# Patient Record
Sex: Male | Born: 1955 | Race: White | Hispanic: No | Marital: Married | Smoking: Never smoker
Health system: Southern US, Community
[De-identification: ages and names within clinical notes are randomized; demographics above are authoritative.]

## PROBLEM LIST (undated history)

## (undated) DIAGNOSIS — K4090 Unilateral inguinal hernia, without obstruction or gangrene, not specified as recurrent: Secondary | ICD-10-CM

## (undated) DIAGNOSIS — R059 Cough, unspecified: Secondary | ICD-10-CM

## (undated) DIAGNOSIS — B9789 Other viral agents as the cause of diseases classified elsewhere: Secondary | ICD-10-CM

## (undated) DIAGNOSIS — I499 Cardiac arrhythmia, unspecified: Secondary | ICD-10-CM

## (undated) DIAGNOSIS — R140 Abdominal distension (gaseous): Secondary | ICD-10-CM

## (undated) DIAGNOSIS — R05 Cough: Secondary | ICD-10-CM

## (undated) DIAGNOSIS — Z833 Family history of diabetes mellitus: Secondary | ICD-10-CM

## (undated) DIAGNOSIS — L299 Pruritus, unspecified: Secondary | ICD-10-CM

## (undated) HISTORY — PX: WISDOM TOOTH EXTRACTION: SHX21

## (undated) HISTORY — DX: Pruritus, unspecified: L29.9

## (undated) HISTORY — PX: CARDIAC CATHETERIZATION: SHX172

## (undated) HISTORY — DX: Other viral agents as the cause of diseases classified elsewhere: B97.89

## (undated) HISTORY — DX: Cough, unspecified: R05.9

## (undated) HISTORY — DX: Unilateral inguinal hernia, without obstruction or gangrene, not specified as recurrent: K40.90

## (undated) HISTORY — DX: Cough: R05

## (undated) HISTORY — DX: Family history of diabetes mellitus: Z83.3

## (undated) HISTORY — DX: Abdominal distension (gaseous): R14.0

---

## 2001-07-08 ENCOUNTER — Ambulatory Visit (HOSPITAL_COMMUNITY): Admission: RE | Admit: 2001-07-08 | Discharge: 2001-07-08 | Payer: Self-pay | Admitting: Gastroenterology

## 2011-01-23 ENCOUNTER — Encounter (INDEPENDENT_AMBULATORY_CARE_PROVIDER_SITE_OTHER): Payer: Self-pay | Admitting: General Surgery

## 2011-01-23 ENCOUNTER — Encounter (HOSPITAL_COMMUNITY): Payer: Self-pay | Admitting: Pharmacy Technician

## 2011-01-23 ENCOUNTER — Ambulatory Visit (INDEPENDENT_AMBULATORY_CARE_PROVIDER_SITE_OTHER): Payer: BC Managed Care – PPO | Admitting: General Surgery

## 2011-01-23 VITALS — BP 132/86 | HR 68 | Temp 97.8°F | Resp 16 | Ht 77.0 in | Wt 210.4 lb

## 2011-01-23 DIAGNOSIS — K4020 Bilateral inguinal hernia, without obstruction or gangrene, not specified as recurrent: Secondary | ICD-10-CM

## 2011-01-23 NOTE — Progress Notes (Signed)
Chief Complaint  Patient presents with  . Other    Eval right inguinal henia    HISTORY: Pt is health 56 year old male who presents with several weeks of progressively worsening R groin pain.  He does not recall any significant event where he was doing heavy lifting.  He does not recall any trauma.  He has noted a bulge.  He denies nausea/vomiting.  He denies urinary retention or cough.  He has never had hernias before, but his father had bilateral inguinal hernias.  He has been taking over the counter analgesics.    Past Medical History  Diagnosis Date  . Right inguinal hernia   . Abdominal pain   . Abdominal distension   . Cough   . Itching   . Pleurisy   . Unspecified viral infection, in conditions classified elsewhere and of unspecified site     viral syndrome - per notes from [facility] dated 01/03/11  . Family history of diabetes mellitus    Wisdom teeth extraction  History reviewed. No pertinent past surgical history.  Current Outpatient Prescriptions  Medication Sig Dispense Refill  . traMADol (ULTRAM) 50 MG tablet as needed.         No Known Allergies   History reviewed. No pertinent family history.   History   Social History  . Marital Status: Married    Spouse Name: N/A    Number of Children: N/A  . Years of Education: N/A   Social History Main Topics  . Smoking status: Never Smoker   . Smokeless tobacco: Never Used  . Alcohol Use: Yes     1 - 3 wine or beer weekly  . Drug Use: No  . Sexually Active: None   Other Topics Concern  . None   Social History Narrative  . None     REVIEW OF SYSTEMS - PERTINENT POSITIVES ONLY: 12 point review of systems negative other than HPI and PMH except for abdominal distention.  EXAM: Filed Vitals:   01/23/11 1339  BP: 132/86  Pulse: 68  Temp: 97.8 F (36.6 C)  Resp: 16    Gen:  No acute distress.  Well nourished and well groomed.   Neurological: Alert and oriented to person, place, and time.  Coordination normal.  Head: Normocephalic and atraumatic.  Eyes: Conjunctivae are normal. Pupils are equal, round, and reactive to light. No scleral icterus.  Neck: Normal range of motion. Neck supple. No tracheal deviation or thyromegaly present.  Cardiovascular: Normal rate, regular rhythm, normal heart sounds and intact distal pulses.  Exam reveals no gallop and no friction rub.  No murmur heard. Respiratory: Effort normal.  No respiratory distress. No chest wall tenderness. Breath sounds normal.  No wheezes, rales or rhonchi.  GI: Soft. Bowel sounds are normal. The abdomen is soft and nontender.  There is no rebound and no guarding.  Groin:  Bilateral hernias present.  Small left, moderate right.  Right side is tender.  Normal external male genitalia.  Musculoskeletal: Normal range of motion. Extremities are nontender.  Lymphadenopathy: No cervical, preauricular, postauricular or axillary adenopathy is present Skin: Skin is warm and dry. No rash noted. No diaphoresis. No erythema. No pallor. No clubbing, cyanosis, or edema.   Psychiatric: Normal mood and affect. Behavior is normal. Judgment and thought content normal.     ASSESSMENT AND PLAN: Bilateral inguinal hernia Pt with symptomatic Right inguinal hernia and left inguinal hernia Plan laparoscopic repair of both.   I described the procedure in detail.  The patient was given educational material. We discussed the risks and benefits including but not limited to bleeding, infection, chronic pain, nerve entrapment, hernia recurrence, mesh complications, hematoma formation, urinary retention, injury to the testicles or the ovaries, numbness in the groin, blood clots, injury to the surrounding structures, and anesthesia risk. We also discussed the typical post operative recovery course, including no heavy lifting for 6 weeks.     Maudry Diego MD Surgical Oncology, General and Endocrine Surgery [facility]   Visit  Diagnoses: 1. Bilateral inguinal hernia     Primary Care Physician: Delorse Lek, MD, MD

## 2011-01-23 NOTE — Assessment & Plan Note (Signed)
Pt with symptomatic Right inguinal hernia and left inguinal hernia Plan laparoscopic repair of both.   I described the procedure in detail.  The patient was given educational material. We discussed the risks and benefits including but not limited to bleeding, infection, chronic pain, nerve entrapment, hernia recurrence, mesh complications, hematoma formation, urinary retention, injury to the testicles or the ovaries, numbness in the groin, blood clots, injury to the surrounding structures, and anesthesia risk. We also discussed the typical post operative recovery course, including no heavy lifting for 6 weeks.

## 2011-01-23 NOTE — Patient Instructions (Signed)
PATIENT INSTRUCTIONS  HERNIA  FOLLOW-UP:  Please make an appointment with your physician in 3 week(s).  Call your physician immediately if you have any fevers greater than 102.5, drainage from you wound that is not clear or looks infected, persistent bleeding, increasing abdominal pain, problems urinating, or persistent nausea/vomiting.    WOUND CARE INSTRUCTIONS:   Your wound will be covered with Dermabond surgical glue or Steristrips, gauze and tape.  If there are outer dressings in place such as gauze and tape, you should remove the outer dressing in 48 hours.  If clothing rubs against the wound or causes irritation and the wound is not draining you may cover it with a dry dressing during the daytime.  Try to keep the wound dry and avoid ointments on the wound unless directed to do so.  If the wound becomes bright red and painful or starts to drain infected material that is not clear, please contact your physician immediately.  If the wound is mildly pink and has a thick firm ridge underneath it, this is normal, and is referred to as a healing ridge.  This will resolve over the next 4-6 weeks. You may use ice packs or a heating pad on your incision, but place a shirt or towel between the ice pack/heating pad and your skin.  You may do this for 10-20 minutes every 4 hours.  You can expect to see bruising and swelling in the scrotum or labia if you have had an inguinal hernia repair.  If you have had a ventral/incisional/umbilical hernia repair, you may see some swelling where the hernia was.    DIET:  You may eat any foods that you can tolerate.  It is a good idea to eat a high fiber diet and take in plenty of fluids to prevent constipation.  If you do become constipated you may want to take a mild laxative or take Ducolax tablets on a daily basis until your bowel habits are regular.  Constipation can be very uncomfortable, along with straining, after recent abdominal surgery.  ACTIVITY:  You are  encouraged to cough and deep breath or use your incentive spirometer if you were given one, every 15-30 minutes when awake.  This will help prevent respiratory complications and low grade fevers post-operatively.  You may want to hug a pillow when coughing and sneezing to add additional support to the surgical area which will decrease pain during these times.  You are encouraged to walk and engage in light activity for the next two weeks.  You should not lift more than 20 pounds for 6 weeks as it could put you at increased risk for a hernia recurrence.  Twenty pounds is roughly equivalent to a plastic bag of groceries.    MEDICATIONS:  Try to take narcotic medications and anti-inflammatory medications, such as ibuprofen, naprosyn, etc., with food.  This will minimize stomach upset from the medication.  You may take Tylenol (acetominophen) if you are not taking your prescription medicine.  The prescription medicine has tylenol in it, and if you take both, you may overdose on tylenol which can cause liver failure.   Should you develop nausea and vomiting from the pain medication, or develop a rash, please discontinue the medication and contact your physician.  You should not drive, make important decisions, or operate machinery when taking narcotic pain medication.  QUESTIONS:  Please feel free to call our office [phone] if you have any questions, and we will be glad to assist you.    Please give all insurance/disability forms to the front desk at our office.     

## 2011-01-25 ENCOUNTER — Ambulatory Visit (INDEPENDENT_AMBULATORY_CARE_PROVIDER_SITE_OTHER): Payer: BC Managed Care – PPO | Admitting: General Surgery

## 2011-01-27 ENCOUNTER — Encounter (HOSPITAL_COMMUNITY)
Admission: RE | Admit: 2011-01-27 | Discharge: 2011-01-27 | Disposition: A | Discharge status: Home / Self Care | Payer: BC Managed Care – PPO | Source: Ambulatory Visit | Attending: General Surgery | Admitting: General Surgery

## 2011-01-27 ENCOUNTER — Encounter (HOSPITAL_COMMUNITY): Payer: Self-pay

## 2011-01-27 LAB — CBC
HCT: 44.5 % (ref 39.0–52.0)
Hemoglobin: 16.2 g/dL (ref 13.0–17.0)
RBC: 5.19 MIL/uL (ref 4.22–5.81)
RDW: 13 % (ref 11.5–15.5)
WBC: 5 10*3/uL (ref 4.0–10.5)

## 2011-01-27 LAB — SURGICAL PCR SCREEN: Staphylococcus aureus: POSITIVE — AB

## 2011-01-27 NOTE — Pre-Procedure Instructions (Signed)
Alex Arias  01/27/2011   Your procedure is scheduled on:  02/01/11  Report to [facility] at 6:30 AM.  Call this number if you have problems the morning of surgery: [phone]   Remember: Discontinue Aspirin, Coumadin, Plavix, Effient and herbal medications.   Do not eat food:After Midnight.  May have clear liquids: up to 4 Hours before arrival.  Clear liquids include soda, tea, black coffee, apple or grape juice, broth.  Take these medicines the morning of surgery with A SIP OF WATER: Tramadol   Do not wear jewelry, make-up or nail polish.  Do not wear lotions, powders, or perfumes. You may wear deodorant.  Do not shave 48 hours prior to surgery.  Do not bring valuables to the hospital.  Contacts, dentures or bridgework may not be worn into surgery.  Leave suitcase in the car. After surgery it may be brought to your room.  For patients admitted to the hospital, checkout time is 11:00 AM the day of discharge.   Patients discharged the day of surgery will not be allowed to drive home.  Name and phone number of your driver: spouse Alex Arias  Special Instructions: CHG Shower Use Special Wash: 1/2 bottle night before surgery and 1/2 bottle morning of surgery.   Please read over the following fact sheets that you were given: Pain Booklet, Coughing and Deep Breathing, MRSA Information and Surgical Site Infection Prevention

## 2011-01-31 MED ORDER — CEFAZOLIN SODIUM-DEXTROSE 2-3 GM-% IV SOLR
2.0000 g | INTRAVENOUS | Status: AC
  Administered 2011-02-01: 2 g via INTRAVENOUS
  Filled 2011-01-31: qty 50

## 2011-02-01 ENCOUNTER — Ambulatory Visit (HOSPITAL_COMMUNITY): Payer: BC Managed Care – PPO | Admitting: Anesthesiology

## 2011-02-01 ENCOUNTER — Encounter (HOSPITAL_COMMUNITY): Payer: Self-pay | Admitting: Anesthesiology

## 2011-02-01 ENCOUNTER — Encounter (HOSPITAL_COMMUNITY)
Admission: RE | Disposition: A | Discharge status: Home / Self Care | Payer: Self-pay | Source: Ambulatory Visit | Attending: General Surgery

## 2011-02-01 ENCOUNTER — Other Ambulatory Visit (INDEPENDENT_AMBULATORY_CARE_PROVIDER_SITE_OTHER): Payer: Self-pay | Admitting: General Surgery

## 2011-02-01 ENCOUNTER — Encounter (HOSPITAL_COMMUNITY): Payer: Self-pay | Admitting: *Deleted

## 2011-02-01 ENCOUNTER — Ambulatory Visit (HOSPITAL_COMMUNITY)
Admission: RE | Admit: 2011-02-01 | Discharge: 2011-02-01 | Disposition: A | Discharge status: Home / Self Care | Payer: BC Managed Care – PPO | Source: Ambulatory Visit | Attending: General Surgery | Admitting: General Surgery

## 2011-02-01 ENCOUNTER — Encounter (HOSPITAL_COMMUNITY): Payer: Self-pay | Admitting: General Surgery

## 2011-02-01 DIAGNOSIS — K4020 Bilateral inguinal hernia, without obstruction or gangrene, not specified as recurrent: Secondary | ICD-10-CM | POA: Insufficient documentation

## 2011-02-01 DIAGNOSIS — Z01812 Encounter for preprocedural laboratory examination: Secondary | ICD-10-CM | POA: Insufficient documentation

## 2011-02-01 HISTORY — PX: INGUINAL HERNIA REPAIR: SHX194

## 2011-02-01 SURGERY — REPAIR, HERNIA, INGUINAL, LAPAROSCOPIC
Anesthesia: General | Site: Groin | Laterality: Bilateral | Wound class: Clean

## 2011-02-01 MED ORDER — PROPOFOL 10 MG/ML IV EMUL
INTRAVENOUS | Status: DC | PRN
  Administered 2011-02-01: 120 mg via INTRAVENOUS

## 2011-02-01 MED ORDER — NEOSTIGMINE METHYLSULFATE 1 MG/ML IJ SOLN
INTRAMUSCULAR | Status: DC | PRN
  Administered 2011-02-01: 3 mg via INTRAVENOUS

## 2011-02-01 MED ORDER — ROCURONIUM BROMIDE 100 MG/10ML IV SOLN
INTRAVENOUS | Status: DC | PRN
  Administered 2011-02-01: 50 mg via INTRAVENOUS

## 2011-02-01 MED ORDER — OXYCODONE-ACETAMINOPHEN 5-325 MG PO TABS: 1.0000 | ORAL_TABLET | ORAL | Status: AC | PRN

## 2011-02-01 MED ORDER — LIDOCAINE HCL 1 % IJ SOLN
INTRAMUSCULAR | Status: DC | PRN
  Administered 2011-02-01: 11:00:00

## 2011-02-01 MED ORDER — MIDAZOLAM HCL 5 MG/5ML IJ SOLN
INTRAMUSCULAR | Status: DC | PRN
  Administered 2011-02-01: 2 mg via INTRAVENOUS

## 2011-02-01 MED ORDER — ONDANSETRON HCL 4 MG/2ML IJ SOLN: 4.0000 mg | Freq: Once | INTRAMUSCULAR | Status: DC | PRN

## 2011-02-01 MED ORDER — GLYCOPYRROLATE 0.2 MG/ML IJ SOLN
INTRAMUSCULAR | Status: DC | PRN
  Administered 2011-02-01: .5 mg via INTRAVENOUS

## 2011-02-01 MED ORDER — FENTANYL CITRATE 0.05 MG/ML IJ SOLN
INTRAMUSCULAR | Status: DC | PRN
  Administered 2011-02-01: 100 ug via INTRAVENOUS
  Administered 2011-02-01: 25 ug via INTRAVENOUS
  Administered 2011-02-01: 150 ug via INTRAVENOUS
  Administered 2011-02-01: 25 ug via INTRAVENOUS
  Administered 2011-02-01: 100 ug via INTRAVENOUS

## 2011-02-01 MED ORDER — ONDANSETRON HCL 4 MG/2ML IJ SOLN
INTRAMUSCULAR | Status: DC | PRN
  Administered 2011-02-01: 4 mg via INTRAVENOUS

## 2011-02-01 MED ORDER — LACTATED RINGERS IV SOLN
INTRAVENOUS | Status: DC | PRN
  Administered 2011-02-01 (×2): via INTRAVENOUS

## 2011-02-01 MED ORDER — MORPHINE SULFATE 2 MG/ML IJ SOLN: 0.0500 mg/kg | INTRAMUSCULAR | Status: DC | PRN

## 2011-02-01 MED ORDER — MEPERIDINE HCL 25 MG/ML IJ SOLN: 6.2500 mg | INTRAMUSCULAR | Status: DC | PRN

## 2011-02-01 MED ORDER — PROMETHAZINE HCL 12.5 MG PO TABS: 12.5000 mg | ORAL_TABLET | Freq: Four times a day (QID) | ORAL | Status: AC | PRN

## 2011-02-01 MED ORDER — HYDROMORPHONE HCL PF 1 MG/ML IJ SOLN
0.2500 mg | INTRAMUSCULAR | Status: DC | PRN
  Administered 2011-02-01 (×2): 0.5 mg via INTRAVENOUS

## 2011-02-01 MED ORDER — 0.9 % SODIUM CHLORIDE (POUR BTL) OPTIME
TOPICAL | Status: DC | PRN
  Administered 2011-02-01: 2000 mL

## 2011-02-01 SURGICAL SUPPLY — 53 items
ADH SKN CLS LQ APL DERMABOND (GAUZE/BANDAGES/DRESSINGS) ×1
APPLIER CLIP 5 13 M/L LIGAMAX5 (MISCELLANEOUS) ×4
APPLIER CLIP ROT 10 11.4 M/L (STAPLE)
APR CLP MED LRG 11.4X10 (STAPLE)
APR CLP MED LRG 5 ANG JAW (MISCELLANEOUS) ×2
BLADE SURG ROTATE 9660 (MISCELLANEOUS) IMPLANT
CANISTER SUCTION 2500CC (MISCELLANEOUS) IMPLANT
CHLORAPREP W/TINT 26ML (MISCELLANEOUS) ×2 IMPLANT
CLIP APPLIE 5 13 M/L LIGAMAX5 (MISCELLANEOUS) IMPLANT
CLIP APPLIE ROT 10 11.4 M/L (STAPLE) IMPLANT
CLOTH BEACON ORANGE TIMEOUT ST (SAFETY) ×2 IMPLANT
COVER SURGICAL LIGHT HANDLE (MISCELLANEOUS) ×2 IMPLANT
DECANTER SPIKE VIAL GLASS SM (MISCELLANEOUS) ×2 IMPLANT
DERMABOND ADHESIVE PROPEN (GAUZE/BANDAGES/DRESSINGS) ×1
DERMABOND ADVANCED .7 DNX6 (GAUZE/BANDAGES/DRESSINGS) IMPLANT
DEVICE SECURE STRAP 25 ABSORB (INSTRUMENTS) ×3 IMPLANT
DISSECT BALLN SPACEMKR + OVL (BALLOONS) ×2
DISSECTOR BALLN SPACEMKR + OVL (BALLOONS) ×1 IMPLANT
DRAPE UTILITY 15X26 W/TAPE STR (DRAPE) ×4 IMPLANT
DRAPE UTILITY XL STRL (DRAPES) ×4 IMPLANT
DRAPE WARM FLUID 44X44 (DRAPE) ×2 IMPLANT
ELECT REM PT RETURN 9FT ADLT (ELECTROSURGICAL) ×2
ELECTRODE REM PT RTRN 9FT ADLT (ELECTROSURGICAL) ×1 IMPLANT
GLOVE BIO SURGEON STRL SZ 6 (GLOVE) ×2 IMPLANT
GLOVE BIO SURGEON STRL SZ7.5 (GLOVE) ×1 IMPLANT
GLOVE BIOGEL PI IND STRL 6.5 (GLOVE) ×1 IMPLANT
GLOVE BIOGEL PI IND STRL 7.0 (GLOVE) IMPLANT
GLOVE BIOGEL PI IND STRL 7.5 (GLOVE) IMPLANT
GLOVE BIOGEL PI INDICATOR 6.5 (GLOVE) ×1
GLOVE BIOGEL PI INDICATOR 7.0 (GLOVE) ×1
GLOVE BIOGEL PI INDICATOR 7.5 (GLOVE) ×1
GLOVE ECLIPSE 7.0 STRL STRAW (GLOVE) ×1 IMPLANT
GLOVE SS BIOGEL STRL SZ 7.5 (GLOVE) IMPLANT
GLOVE SUPERSENSE BIOGEL SZ 7.5 (GLOVE) ×3
GOWN PREVENTION PLUS XXLARGE (GOWN DISPOSABLE) ×3 IMPLANT
GOWN STRL NON-REIN LRG LVL3 (GOWN DISPOSABLE) ×4 IMPLANT
KIT BASIN OR (CUSTOM PROCEDURE TRAY) ×2 IMPLANT
KIT ROOM TURNOVER OR (KITS) ×2 IMPLANT
MESH ULTRAPRO 6X6 15CM15CM (Mesh General) ×1 IMPLANT
NDL INSUFFLATION 14GA 120MM (NEEDLE) IMPLANT
NEEDLE INSUFFLATION 14GA 120MM (NEEDLE) ×2 IMPLANT
NS IRRIG 1000ML POUR BTL (IV SOLUTION) ×2 IMPLANT
PAD ARMBOARD 7.5X6 YLW CONV (MISCELLANEOUS) ×4 IMPLANT
SET IRRIG TUBING LAPAROSCOPIC (IRRIGATION / IRRIGATOR) IMPLANT
SET TROCAR LAP APPLE-HUNT 5MM (ENDOMECHANICALS) ×2 IMPLANT
SUT MNCRL AB 4-0 PS2 18 (SUTURE) ×2 IMPLANT
SUT VICRYL 0 TIES 12 18 (SUTURE) IMPLANT
SYRINGE 10CC LL (SYRINGE) IMPLANT
TOWEL OR 17X24 6PK STRL BLUE (TOWEL DISPOSABLE) ×2 IMPLANT
TOWEL OR 17X26 10 PK STRL BLUE (TOWEL DISPOSABLE) ×2 IMPLANT
TRAY FOLEY CATH 14FR (SET/KITS/TRAYS/PACK) ×2 IMPLANT
TRAY LAPAROSCOPIC (CUSTOM PROCEDURE TRAY) ×2 IMPLANT
WATER STERILE IRR 1000ML POUR (IV SOLUTION) IMPLANT

## 2011-02-01 NOTE — Transfer of Care (Signed)
Immediate Anesthesia Transfer of Care Note  Patient: Alex Arias  Procedure(s) Performed:  LAPAROSCOPIC INGUINAL HERNIA - Laparoscopic inguinal repair with mesh - Bilateral  Patient Location: PACU  Anesthesia Type: General  Level of Consciousness: awake, alert  and oriented  Airway & Oxygen Therapy: Patient Spontanous Breathing  Post-op Assessment: Report given to PACU RN, Post -op Vital signs reviewed and stable and Patient moving all extremities X 4  Post vital signs: Reviewed and stable Filed Vitals:   02/01/11 1048  BP:   Pulse:   Temp: 36.6 C  Resp:     Complications: No apparent anesthesia complications

## 2011-02-01 NOTE — Anesthesia Preprocedure Evaluation (Addendum)
Anesthesia Evaluation  Patient identified by MRN, date of birth, ID band Patient awake    Reviewed: Allergy & Precautions, H&P , NPO status , Patient's Chart, lab work & pertinent test results  Airway Mallampati: I TM Distance: >3 FB Neck ROM: Full    Dental  (+) Teeth Intact   Pulmonary          Cardiovascular Regular     Neuro/Psych    GI/Hepatic   Endo/Other    Renal/GU      Musculoskeletal   Abdominal   Peds  Hematology   Anesthesia Other Findings   Reproductive/Obstetrics                          Anesthesia Physical Anesthesia Plan  ASA: I  Anesthesia Plan: General   Post-op Pain Management:    Induction: Intravenous  Airway Management Planned: Oral ETT  Additional Equipment:   Intra-op Plan:   Post-operative Plan: Extubation in OR  Informed Consent: I have reviewed the patients History and Physical, chart, labs and discussed the procedure including the risks, benefits and alternatives for the proposed anesthesia with the patient or authorized representative who has indicated his/her understanding and acceptance.   Dental advisory given  Plan Discussed with: CRNA, Anesthesiologist and Surgeon  Anesthesia Plan Comments:         Anesthesia Quick Evaluation

## 2011-02-01 NOTE — Preoperative (Signed)
Beta Blockers   Reason not to administer Beta Blockers:Not Applicable 

## 2011-02-01 NOTE — Anesthesia Procedure Notes (Signed)
Procedure Name: Intubation Date/Time: 02/01/2011 8:57 AM Performed by: Rosita Fire Pre-anesthesia Checklist: Patient identified, Emergency Drugs available, Suction available and Patient being monitored Patient Re-evaluated:Patient Re-evaluated prior to inductionOxygen Delivery Method: Circle System Utilized Preoxygenation: Pre-oxygenation with 100% oxygen Intubation Type: IV induction Ventilation: Mask ventilation without difficulty Laryngoscope Size: Miller and 2 Grade View: Grade I Tube type: Oral Tube size: 8.0 mm Placement Confirmation: ETT inserted through vocal cords under direct vision,  positive ETCO2 and breath sounds checked- equal and bilateral Secured at: 24 cm Tube secured with: Tape Dental Injury: Teeth and Oropharynx as per pre-operative assessment

## 2011-02-01 NOTE — Anesthesia Postprocedure Evaluation (Signed)
  Anesthesia Post-op Note  Patient: Alex Arias  Procedure(s) Performed:  LAPAROSCOPIC INGUINAL HERNIA - Laparoscopic inguinal repair with mesh - Bilateral  Patient Location: PACU  Anesthesia Type: General  Level of Consciousness: awake  Airway and Oxygen Therapy: Patient connected to nasal cannula oxygen  Post-op Pain: none  Post-op Assessment: Post-op Vital signs reviewed  Post-op Vital Signs: stable  Complications: No apparent anesthesia complications

## 2011-02-01 NOTE — Op Note (Signed)
OPERATIVE REPORT   PREOPERATIVE DIAGNOSIS:  Bilateral inguinal hernia  POSTOPERATIVE DIAGNOSIS: Bilateral inguinal hernia   PROCEDURE: Laparoscopic repair of bilateral inguinal hernia with  Ultrapro mesh  SURGEON: Almond Lint, MD   ASSISTANT: None.   ANESTHESIA: General and local.   FINDINGS: Bilateral direct and R indirect inguinal hernias  SPECIMENS: None.   ESTIMATED BLOOD LOSS: 50 mL   COMPLICATIONS: None known.   PROCEDURE: Patient was identified in the holding area and taken to  the operating room where he was placed supine on the operating table.  General anesthesia was induced. His arms were tucked and his abdomen  was clipped, prepped and draped in the sterile fashion. Time-out was  performed according to the surgical safety check list. When all was  correct we continued.  Local anesthetic was injected into the infraumbilical  space and a curvilinear transverse incision was made with a #11 blade.  The subcutaneous tissues were spreaded with a Tresa Endo and S retractors  were used to assist with visualization and the anterior rectus sheath  was incised with a #11 blade. The rectus abdominis muscle was retracted  laterally. The balloon dilator trocar was introduced into the  preperitoneal space and advanced until the tip of the trocar touched the  pubic symphysis. This balloon was inflated 30 times and pressure was  held for 2 minutes. This was then reinflated and held for 1 minute.  The balloon portion was removed and the retention balloon inflated. CO2  was used to hold the preperitoneal space opened with a pressure of 12  mmHg.   The hernia dissectors were used to push the peritoneum posteriorly and  the abdominal wall anteriorly. This was done all the way up to the ASIS  bilaterally. The right side was addressed first as this was the  symptomatic side. The hernia sac was immediately visible and was pulled  posteriorly off of the spermatic cord. The spermatic cord  was dissected  away gently from the surrounding structures. The vas deferens was  clearly seen and was avoided. The testicular vessels were also gently  retracted to elevate the spermatic cord. The right epigastric artery was lower than the muscle and was pulled inferiorly There was a small rent in the  right side peritoneal sac, so a Veress needle was used to evacuate the  pneumoperitoneum.    On the opposite side, the hernia was approached similarly.  There was an indirect hernia  found after dissecting out the pubic tubercle and the spermatic cord.  This was much smaller but present. The sac was pulled posteriorly and  the spermatic cord was gently isolated and elevated away from the  surrounding structures.The 6*6 inch Ultrapro mesh was selected and cut to 3*6 inches.  The left side of mesh was placed first. Tails were cut and the mesh was passed around the cord.  The medial aspect of the mesh overlapped the midline and behind the pubic  tubercle. Tacks were placed both sides of the hernia taking care to  avoid the epigastric artery. This also placed just above the pubic  tubercle. Laterally, care was taken to make sure to apply pressure to  use the secure strap tack and avoid the lateral femoral cutaneous nerve.  This was done similarly on the left side. Once the mesh was laid flat  and tacked into place, pressure was held on the mesh laterally and below  the pubis while the air was evacuated from the preperitoneal space.   The trocars  were removed and the anterior rectus fascia was closed with a  figure-of-eight 0 Vicryl and a UR-6. There was no residual palpable  fascial defect. The skin of all the incisions was closed with 4-0 Monocryl. The wounds were then cleaned,  dried and dressed with Dermabond. The patient was awakened from  anesthesia and taken to PACU in stable condition. Needle, sponge and  instrument counts were correct.  Almond Lint, MD

## 2011-02-01 NOTE — H&P (View-Only) (Signed)
Chief Complaint  Patient presents with  . Other    Eval right inguinal henia    HISTORY: Pt is health 55 year old male who presents with several weeks of progressively worsening R groin pain.  He does not recall any significant event where he was doing heavy lifting.  He does not recall any trauma.  He has noted a bulge.  He denies nausea/vomiting.  He denies urinary retention or cough.  He has never had hernias before, but his father had bilateral inguinal hernias.  He has been taking over the counter analgesics.    Past Medical History  Diagnosis Date  . Right inguinal hernia   . Abdominal pain   . Abdominal distension   . Cough   . Itching   . Pleurisy   . Unspecified viral infection, in conditions classified elsewhere and of unspecified site     viral syndrome - per notes from [facility] dated 01/03/11  . Family history of diabetes mellitus    Wisdom teeth extraction  History reviewed. No pertinent past surgical history.  Current Outpatient Prescriptions  Medication Sig Dispense Refill  . traMADol (ULTRAM) 50 MG tablet as needed.         No Known Allergies   History reviewed. No pertinent family history.   History   Social History  . Marital Status: Married    Spouse Name: N/A    Number of Children: N/A  . Years of Education: N/A   Social History Main Topics  . Smoking status: Never Smoker   . Smokeless tobacco: Never Used  . Alcohol Use: Yes     1 - 3 wine or beer weekly  . Drug Use: No  . Sexually Active: None   Other Topics Concern  . None   Social History Narrative  . None     REVIEW OF SYSTEMS - PERTINENT POSITIVES ONLY: 12 point review of systems negative other than HPI and PMH except for abdominal distention.  EXAM: Filed Vitals:   01/23/11 1339  BP: 132/86  Pulse: 68  Temp: 97.8 F (36.6 C)  Resp: 16    Gen:  No acute distress.  Well nourished and well groomed.   Neurological: Alert and oriented to person, place, and time.  Coordination normal.  Head: Normocephalic and atraumatic.  Eyes: Conjunctivae are normal. Pupils are equal, round, and reactive to light. No scleral icterus.  Neck: Normal range of motion. Neck supple. No tracheal deviation or thyromegaly present.  Cardiovascular: Normal rate, regular rhythm, normal heart sounds and intact distal pulses.  Exam reveals no gallop and no friction rub.  No murmur heard. Respiratory: Effort normal.  No respiratory distress. No chest wall tenderness. Breath sounds normal.  No wheezes, rales or rhonchi.  GI: Soft. Bowel sounds are normal. The abdomen is soft and nontender.  There is no rebound and no guarding.  Groin:  Bilateral hernias present.  Small left, moderate right.  Right side is tender.  Normal external male genitalia.  Musculoskeletal: Normal range of motion. Extremities are nontender.  Lymphadenopathy: No cervical, preauricular, postauricular or axillary adenopathy is present Skin: Skin is warm and dry. No rash noted. No diaphoresis. No erythema. No pallor. No clubbing, cyanosis, or edema.   Psychiatric: Normal mood and affect. Behavior is normal. Judgment and thought content normal.     ASSESSMENT AND PLAN: Bilateral inguinal hernia Pt with symptomatic Right inguinal hernia and left inguinal hernia Plan laparoscopic repair of both.   I described the procedure in detail.    The patient was given educational material. We discussed the risks and benefits including but not limited to bleeding, infection, chronic pain, nerve entrapment, hernia recurrence, mesh complications, hematoma formation, urinary retention, injury to the testicles or the ovaries, numbness in the groin, blood clots, injury to the surrounding structures, and anesthesia risk. We also discussed the typical post operative recovery course, including no heavy lifting for 6 weeks.     Mechele Kittleson L Nilesh Stegall MD Surgical Oncology, General and Endocrine Surgery [facility]   Visit  Diagnoses: 1. Bilateral inguinal hernia     Primary Care Physician: BURNETT,BRENT A, MD, MD    

## 2011-02-01 NOTE — Interval H&P Note (Signed)
History and Physical Interval Note:  02/01/2011 8:40 AM  Alex Arias  has presented today for surgery, with the diagnosis of Bilateral inguinal hernias.  The various methods of treatment have been discussed with the patient and family. After consideration of risks, benefits and other options for treatment, the patient has consented to  Procedure(s): LAPAROSCOPIC INGUINAL HERNIA as a surgical intervention .  The patients' history has been reviewed, patient examined, no change in status, stable for surgery.  I have reviewed the patients' chart and labs.  Questions were answered to the patient's satisfaction.     [name]

## 2011-02-02 ENCOUNTER — Encounter (HOSPITAL_COMMUNITY): Payer: Self-pay | Admitting: General Surgery

## 2011-03-06 ENCOUNTER — Encounter (INDEPENDENT_AMBULATORY_CARE_PROVIDER_SITE_OTHER): Payer: Self-pay | Admitting: General Surgery

## 2011-03-06 ENCOUNTER — Ambulatory Visit (INDEPENDENT_AMBULATORY_CARE_PROVIDER_SITE_OTHER): Payer: BC Managed Care – PPO | Admitting: General Surgery

## 2011-03-06 VITALS — BP 128/80 | HR 68 | Temp 97.8°F | Resp 18 | Ht 77.0 in | Wt 210.8 lb

## 2011-03-06 DIAGNOSIS — K4020 Bilateral inguinal hernia, without obstruction or gangrene, not specified as recurrent: Secondary | ICD-10-CM

## 2011-03-06 NOTE — Patient Instructions (Signed)
No lifting over 30 pounds for another 2 weeks.  Ok to start golf in 2-4 weeks.  Follow up as needed.

## 2011-03-06 NOTE — Assessment & Plan Note (Signed)
Symptoms resolved. Follow up as needed.

## 2011-03-06 NOTE — Progress Notes (Signed)
Subjective:     Patient ID: Alex Arias, male   DOB: 1955/03/09, 56 y.o.   MRN: [id number]  HPI Doing well after lap BIH repair.  Took narcotics for just a few days.  Will occasionally get sore, but he is not having significant discomfort.    Review of Systems Otherwise negative.      Objective:   Physical Exam Incisions well healed.  No sign of recurrent hernia.    Assessment/Plan :     Bilateral inguinal hernia Symptoms resolved.  Follow up as needed.

## 2014-04-02 ENCOUNTER — Inpatient Hospital Stay (HOSPITAL_COMMUNITY)
Admission: EM | Admit: 2014-04-02 | Discharge: 2014-04-06 | DRG: 329 | Disposition: A | Discharge status: Home / Self Care | Payer: BLUE CROSS/BLUE SHIELD | Attending: General Surgery | Admitting: General Surgery

## 2014-04-02 ENCOUNTER — Other Ambulatory Visit (HOSPITAL_COMMUNITY): Payer: Self-pay

## 2014-04-02 ENCOUNTER — Observation Stay (HOSPITAL_COMMUNITY): Payer: BLUE CROSS/BLUE SHIELD | Admitting: Anesthesiology

## 2014-04-02 ENCOUNTER — Emergency Department (HOSPITAL_COMMUNITY): Payer: BLUE CROSS/BLUE SHIELD

## 2014-04-02 ENCOUNTER — Encounter (HOSPITAL_COMMUNITY): Payer: Self-pay | Admitting: Emergency Medicine

## 2014-04-02 ENCOUNTER — Encounter (HOSPITAL_COMMUNITY): Admission: EM | Disposition: A | Discharge status: Home / Self Care | Payer: Self-pay | Source: Home / Self Care

## 2014-04-02 DIAGNOSIS — I10 Essential (primary) hypertension: Secondary | ICD-10-CM | POA: Diagnosis present

## 2014-04-02 DIAGNOSIS — K559 Vascular disorder of intestine, unspecified: Secondary | ICD-10-CM | POA: Diagnosis present

## 2014-04-02 DIAGNOSIS — K4031 Unilateral inguinal hernia, with obstruction, without gangrene, recurrent: Principal | ICD-10-CM | POA: Diagnosis present

## 2014-04-02 DIAGNOSIS — E875 Hyperkalemia: Secondary | ICD-10-CM | POA: Diagnosis not present

## 2014-04-02 DIAGNOSIS — Z7982 Long term (current) use of aspirin: Secondary | ICD-10-CM | POA: Diagnosis not present

## 2014-04-02 DIAGNOSIS — K550 Acute vascular disorders of intestine: Secondary | ICD-10-CM | POA: Diagnosis present

## 2014-04-02 DIAGNOSIS — R109 Unspecified abdominal pain: Secondary | ICD-10-CM

## 2014-04-02 DIAGNOSIS — R100 Acute abdomen: Secondary | ICD-10-CM | POA: Diagnosis present

## 2014-04-02 DIAGNOSIS — K4030 Unilateral inguinal hernia, with obstruction, without gangrene, not specified as recurrent: Secondary | ICD-10-CM | POA: Diagnosis present

## 2014-04-02 HISTORY — PX: INGUINAL HERNIA REPAIR: SHX194

## 2014-04-02 HISTORY — PX: BOWEL RESECTION: SHX1257

## 2014-04-02 LAB — COMPREHENSIVE METABOLIC PANEL
ALBUMIN: 4.1 g/dL (ref 3.5–5.2)
ALK PHOS: 55 U/L (ref 39–117)
ALT: 31 U/L (ref 0–53)
ANION GAP: 15 (ref 5–15)
AST: 31 U/L (ref 0–37)
BILIRUBIN TOTAL: 0.9 mg/dL (ref 0.3–1.2)
BUN: 15 mg/dL (ref 6–23)
CO2: 20 mmol/L (ref 19–32)
Calcium: 9 mg/dL (ref 8.4–10.5)
Chloride: 101 mmol/L (ref 96–112)
Creatinine, Ser: 1.13 mg/dL (ref 0.50–1.35)
GFR calc non Af Amer: 68 mL/min — ABNORMAL LOW (ref >90)
GFR, EST AFRICAN AMERICAN: 79 mL/min — AB (ref >90)
Glucose, Bld: 153 mg/dL — ABNORMAL HIGH (ref 70–99)
POTASSIUM: 3.5 mmol/L (ref 3.5–5.1)
Sodium: 136 mmol/L (ref 135–145)
Total Protein: 6.8 g/dL (ref 6.0–8.3)

## 2014-04-02 LAB — CBC WITH DIFFERENTIAL/PLATELET
BASOS PCT: 0 % (ref 0–1)
Basophils Absolute: 0 10*3/uL (ref 0.0–0.1)
Eosinophils Absolute: 0.2 10*3/uL (ref 0.0–0.7)
Eosinophils Relative: 2 % (ref 0–5)
HEMATOCRIT: 43.9 % (ref 39.0–52.0)
HEMOGLOBIN: 15.8 g/dL (ref 13.0–17.0)
Lymphocytes Relative: 11 % — ABNORMAL LOW (ref 12–46)
Lymphs Abs: 1.3 10*3/uL (ref 0.7–4.0)
MCH: 30.7 pg (ref 26.0–34.0)
MCHC: 36 g/dL (ref 30.0–36.0)
MCV: 85.2 fL (ref 78.0–100.0)
Monocytes Absolute: 0.8 10*3/uL (ref 0.1–1.0)
Monocytes Relative: 7 % (ref 3–12)
NEUTROS PCT: 80 % — AB (ref 43–77)
Neutro Abs: 9.5 10*3/uL — ABNORMAL HIGH (ref 1.7–7.7)
PLATELETS: 165 10*3/uL (ref 150–400)
RBC: 5.15 MIL/uL (ref 4.22–5.81)
RDW: 13.2 % (ref 11.5–15.5)
WBC: 11.8 10*3/uL — ABNORMAL HIGH (ref 4.0–10.5)

## 2014-04-02 LAB — I-STAT CHEM 8, ED
BUN: 19 mg/dL (ref 6–23)
CREATININE: 1 mg/dL (ref 0.50–1.35)
Calcium, Ion: 1.03 mmol/L — ABNORMAL LOW (ref 1.13–1.30)
Chloride: 104 mmol/L (ref 96–112)
Glucose, Bld: 152 mg/dL — ABNORMAL HIGH (ref 70–99)
HEMATOCRIT: 47 % (ref 39.0–52.0)
Hemoglobin: 16 g/dL (ref 13.0–17.0)
POTASSIUM: 3.7 mmol/L (ref 3.5–5.1)
SODIUM: 137 mmol/L (ref 135–145)
TCO2: 19 mmol/L (ref 0–100)

## 2014-04-02 LAB — I-STAT TROPONIN, ED: Troponin i, poc: 0 ng/mL (ref 0.00–0.08)

## 2014-04-02 LAB — PROTIME-INR
INR: 0.98 (ref 0.00–1.49)
PROTHROMBIN TIME: 13.1 s (ref 11.6–15.2)

## 2014-04-02 LAB — LIPASE, BLOOD: Lipase: 26 U/L (ref 11–59)

## 2014-04-02 LAB — I-STAT CG4 LACTIC ACID, ED: Lactic Acid, Venous: 3.46 mmol/L (ref 0.5–2.0)

## 2014-04-02 LAB — TYPE AND SCREEN
ABO/RH(D): B POS
ANTIBODY SCREEN: NEGATIVE

## 2014-04-02 LAB — ABO/RH: ABO/RH(D): B POS

## 2014-04-02 SURGERY — REPAIR, HERNIA, INGUINAL, INCARCERATED
Anesthesia: General | Site: Abdomen | Laterality: Right

## 2014-04-02 MED ORDER — 0.9 % SODIUM CHLORIDE (POUR BTL) OPTIME
TOPICAL | Status: DC | PRN
  Administered 2014-04-02: 1000 mL

## 2014-04-02 MED ORDER — SODIUM CHLORIDE 0.9 % IJ SOLN: 9.0000 mL | INTRAMUSCULAR | Status: DC | PRN

## 2014-04-02 MED ORDER — DIPHENHYDRAMINE HCL 50 MG/ML IJ SOLN: 12.5000 mg | Freq: Four times a day (QID) | INTRAMUSCULAR | Status: DC | PRN

## 2014-04-02 MED ORDER — ONDANSETRON HCL 4 MG/2ML IJ SOLN
4.0000 mg | Freq: Once | INTRAMUSCULAR | Status: AC
  Administered 2014-04-02: 4 mg via INTRAVENOUS
  Filled 2014-04-02: qty 2

## 2014-04-02 MED ORDER — MORPHINE SULFATE 4 MG/ML IJ SOLN
4.0000 mg | Freq: Once | INTRAMUSCULAR | Status: AC
  Administered 2014-04-02: 4 mg via INTRAVENOUS
  Filled 2014-04-02: qty 1

## 2014-04-02 MED ORDER — ONDANSETRON HCL 4 MG/2ML IJ SOLN
INTRAMUSCULAR | Status: AC
  Filled 2014-04-02: qty 2

## 2014-04-02 MED ORDER — SODIUM CHLORIDE 0.9 % IV BOLUS (SEPSIS)
30.0000 mL/kg | Freq: Once | INTRAVENOUS | Status: AC
  Administered 2014-04-04: 500 mL via INTRAVENOUS

## 2014-04-02 MED ORDER — GLYCOPYRROLATE 0.2 MG/ML IJ SOLN
INTRAMUSCULAR | Status: DC | PRN
  Administered 2014-04-02: 0.2 mg via INTRAVENOUS
  Administered 2014-04-02: 0.6 mg via INTRAVENOUS

## 2014-04-02 MED ORDER — BUPIVACAINE-EPINEPHRINE 0.5% -1:200000 IJ SOLN
INTRAMUSCULAR | Status: DC | PRN
  Administered 2014-04-02: 20 mL

## 2014-04-02 MED ORDER — HYDROMORPHONE HCL 1 MG/ML IJ SOLN: 1.0000 mg | INTRAMUSCULAR | Status: DC | PRN

## 2014-04-02 MED ORDER — LIDOCAINE HCL (CARDIAC) 20 MG/ML IV SOLN
INTRAVENOUS | Status: AC
  Filled 2014-04-02: qty 10

## 2014-04-02 MED ORDER — SODIUM CHLORIDE 0.9 % IV BOLUS (SEPSIS)
500.0000 mL | Freq: Once | INTRAVENOUS | Status: AC
  Administered 2014-04-02: 500 mL via INTRAVENOUS

## 2014-04-02 MED ORDER — LISINOPRIL 20 MG PO TABS: 20.0000 mg | ORAL_TABLET | Freq: Every day | ORAL | Status: DC

## 2014-04-02 MED ORDER — PROPOFOL 10 MG/ML IV BOLUS
INTRAVENOUS | Status: DC | PRN
Start: 2014-04-02 — End: 2014-04-02
  Administered 2014-04-02: 200 mg via INTRAVENOUS

## 2014-04-02 MED ORDER — MIDAZOLAM HCL 2 MG/2ML IJ SOLN
INTRAMUSCULAR | Status: AC
  Filled 2014-04-02: qty 2

## 2014-04-02 MED ORDER — SCOPOLAMINE 1 MG/3DAYS TD PT72
MEDICATED_PATCH | TRANSDERMAL | Status: DC | PRN
  Administered 2014-04-02: 1 via TRANSDERMAL

## 2014-04-02 MED ORDER — HEPARIN SODIUM (PORCINE) 5000 UNIT/ML IJ SOLN
5000.0000 [IU] | Freq: Three times a day (TID) | INTRAMUSCULAR | Status: DC
  Administered 2014-04-03 – 2014-04-06 (×10): 5000 [IU] via SUBCUTANEOUS
  Filled 2014-04-02 (×11): qty 1

## 2014-04-02 MED ORDER — ONDANSETRON HCL 4 MG/2ML IJ SOLN: 4.0000 mg | Freq: Once | INTRAMUSCULAR | Status: DC | PRN

## 2014-04-02 MED ORDER — BUPIVACAINE-EPINEPHRINE (PF) 0.5% -1:200000 IJ SOLN
INTRAMUSCULAR | Status: AC
  Filled 2014-04-02: qty 30

## 2014-04-02 MED ORDER — LACTATED RINGERS IV SOLN
INTRAVENOUS | Status: DC
  Administered 2014-04-02: 17:00:00 via INTRAVENOUS

## 2014-04-02 MED ORDER — FENTANYL CITRATE 0.05 MG/ML IJ SOLN
INTRAMUSCULAR | Status: AC
  Filled 2014-04-02: qty 5

## 2014-04-02 MED ORDER — ONDANSETRON HCL 4 MG/2ML IJ SOLN: 4.0000 mg | Freq: Four times a day (QID) | INTRAMUSCULAR | Status: DC | PRN

## 2014-04-02 MED ORDER — KCL IN DEXTROSE-NACL 20-5-0.45 MEQ/L-%-% IV SOLN
INTRAVENOUS | Status: DC
  Administered 2014-04-02 – 2014-04-03 (×2): via INTRAVENOUS
  Filled 2014-04-02 (×5): qty 1000

## 2014-04-02 MED ORDER — LISINOPRIL-HYDROCHLOROTHIAZIDE 20-12.5 MG PO TABS: 1.0000 | ORAL_TABLET | Freq: Every day | ORAL | Status: DC

## 2014-04-02 MED ORDER — SCOPOLAMINE 1 MG/3DAYS TD PT72
MEDICATED_PATCH | TRANSDERMAL | Status: AC
  Filled 2014-04-02: qty 1

## 2014-04-02 MED ORDER — NEOSTIGMINE METHYLSULFATE 10 MG/10ML IV SOLN
INTRAVENOUS | Status: DC | PRN
  Administered 2014-04-02: 4 mg via INTRAVENOUS

## 2014-04-02 MED ORDER — MEPERIDINE HCL 25 MG/ML IJ SOLN
6.2500 mg | INTRAMUSCULAR | Status: DC | PRN
Start: 2014-04-02 — End: 2014-04-02

## 2014-04-02 MED ORDER — HYDROCHLOROTHIAZIDE 12.5 MG PO CAPS: 12.5000 mg | ORAL_CAPSULE | Freq: Every day | ORAL | Status: DC

## 2014-04-02 MED ORDER — PROPOFOL 10 MG/ML IV BOLUS
INTRAVENOUS | Status: AC
  Filled 2014-04-02: qty 20

## 2014-04-02 MED ORDER — ROCURONIUM BROMIDE 50 MG/5ML IV SOLN
INTRAVENOUS | Status: AC
  Filled 2014-04-02: qty 1

## 2014-04-02 MED ORDER — HYDROMORPHONE 0.3 MG/ML IV SOLN
INTRAVENOUS | Status: DC
  Administered 2014-04-02: 20:00:00 via INTRAVENOUS
  Administered 2014-04-03: 0.3 mg via INTRAVENOUS
  Administered 2014-04-03: 1.5 mg via INTRAVENOUS
  Administered 2014-04-03: 0.9 mg via INTRAVENOUS

## 2014-04-02 MED ORDER — ROCURONIUM BROMIDE 100 MG/10ML IV SOLN
INTRAVENOUS | Status: DC | PRN
  Administered 2014-04-02: 10 mg via INTRAVENOUS
  Administered 2014-04-02: 15 mg via INTRAVENOUS
  Administered 2014-04-02: 40 mg via INTRAVENOUS

## 2014-04-02 MED ORDER — LACTATED RINGERS IV SOLN: INTRAVENOUS | Status: DC

## 2014-04-02 MED ORDER — ONDANSETRON HCL 4 MG PO TABS: 4.0000 mg | ORAL_TABLET | Freq: Four times a day (QID) | ORAL | Status: DC | PRN

## 2014-04-02 MED ORDER — HYDROMORPHONE HCL 1 MG/ML IJ SOLN: 0.2500 mg | INTRAMUSCULAR | Status: DC | PRN

## 2014-04-02 MED ORDER — HYDROMORPHONE 0.3 MG/ML IV SOLN
INTRAVENOUS | Status: AC
  Filled 2014-04-02: qty 25

## 2014-04-02 MED ORDER — LIDOCAINE HCL (CARDIAC) 20 MG/ML IV SOLN
INTRAVENOUS | Status: AC
Start: 2014-04-02 — End: 2014-04-02
  Filled 2014-04-02: qty 5

## 2014-04-02 MED ORDER — ROCURONIUM BROMIDE 50 MG/5ML IV SOLN
INTRAVENOUS | Status: AC
  Filled 2014-04-02: qty 2

## 2014-04-02 MED ORDER — DEXTROSE 5 % IV SOLN
2.0000 g | Freq: Once | INTRAVENOUS | Status: DC
  Filled 2014-04-02: qty 2

## 2014-04-02 MED ORDER — CEFAZOLIN SODIUM-DEXTROSE 2-3 GM-% IV SOLR
INTRAVENOUS | Status: DC | PRN
  Administered 2014-04-02: 2 g via INTRAVENOUS

## 2014-04-02 MED ORDER — SODIUM CHLORIDE 0.9 % IV SOLN
INTRAVENOUS | Status: DC
  Administered 2014-04-02: 16:00:00 via INTRAVENOUS

## 2014-04-02 MED ORDER — MIDAZOLAM HCL 5 MG/5ML IJ SOLN
INTRAMUSCULAR | Status: DC | PRN
  Administered 2014-04-02: 2 mg via INTRAVENOUS

## 2014-04-02 MED ORDER — FENTANYL CITRATE 0.05 MG/ML IJ SOLN
INTRAMUSCULAR | Status: DC | PRN
  Administered 2014-04-02: 100 ug via INTRAVENOUS
  Administered 2014-04-02 (×5): 50 ug via INTRAVENOUS

## 2014-04-02 MED ORDER — BUPIVACAINE-EPINEPHRINE (PF) 0.25% -1:200000 IJ SOLN
INTRAMUSCULAR | Status: AC
  Filled 2014-04-02: qty 30

## 2014-04-02 MED ORDER — CEFAZOLIN SODIUM-DEXTROSE 2-3 GM-% IV SOLR
INTRAVENOUS | Status: AC
  Filled 2014-04-02: qty 50

## 2014-04-02 MED ORDER — DEXAMETHASONE SODIUM PHOSPHATE 4 MG/ML IJ SOLN
INTRAMUSCULAR | Status: DC | PRN
  Administered 2014-04-02: 8 mg via INTRAVENOUS

## 2014-04-02 MED ORDER — IOHEXOL 350 MG/ML SOLN
100.0000 mL | Freq: Once | INTRAVENOUS | Status: AC | PRN
  Administered 2014-04-02: 100 mL via INTRAVENOUS

## 2014-04-02 MED ORDER — LACTATED RINGERS IV SOLN
INTRAVENOUS | Status: DC | PRN
  Administered 2014-04-02 (×2): via INTRAVENOUS

## 2014-04-02 MED ORDER — DEXTROSE 5 % IV SOLN
2.0000 g | INTRAVENOUS | Status: DC
  Filled 2014-04-02: qty 2

## 2014-04-02 MED ORDER — OXYCODONE HCL 5 MG PO TABS
5.0000 mg | ORAL_TABLET | Freq: Once | ORAL | Status: DC | PRN
Start: 2014-04-02 — End: 2014-04-02

## 2014-04-02 MED ORDER — NALOXONE HCL 0.4 MG/ML IJ SOLN: 0.4000 mg | INTRAMUSCULAR | Status: DC | PRN

## 2014-04-02 MED ORDER — DIPHENHYDRAMINE HCL 12.5 MG/5ML PO ELIX: 12.5000 mg | ORAL_SOLUTION | Freq: Four times a day (QID) | ORAL | Status: DC | PRN

## 2014-04-02 MED ORDER — ONDANSETRON HCL 4 MG/2ML IJ SOLN
INTRAMUSCULAR | Status: DC | PRN
  Administered 2014-04-02: 4 mg via INTRAVENOUS

## 2014-04-02 MED ORDER — MORPHINE SULFATE 2 MG/ML IJ SOLN
4.0000 mg | Freq: Once | INTRAMUSCULAR | Status: AC
  Administered 2014-04-02: 4 mg via INTRAVENOUS
  Filled 2014-04-02: qty 2

## 2014-04-02 MED ORDER — DEXAMETHASONE SODIUM PHOSPHATE 4 MG/ML IJ SOLN
INTRAMUSCULAR | Status: AC
  Filled 2014-04-02: qty 2

## 2014-04-02 MED ORDER — OXYCODONE HCL 5 MG/5ML PO SOLN: 5.0000 mg | Freq: Once | ORAL | Status: DC | PRN

## 2014-04-02 SURGICAL SUPPLY — 62 items
BLADE SURG 10 STRL SS (BLADE) ×2 IMPLANT
BLADE SURG 15 STRL LF DISP TIS (BLADE) IMPLANT
BLADE SURG 15 STRL SS (BLADE) ×4
BLADE SURG ROTATE 9660 (MISCELLANEOUS) ×2 IMPLANT
CHLORAPREP W/TINT 26ML (MISCELLANEOUS) ×4 IMPLANT
COVER SURGICAL LIGHT HANDLE (MISCELLANEOUS) ×4 IMPLANT
DRAIN PENROSE 1/2X12 LTX STRL (WOUND CARE) ×2 IMPLANT
DRAPE LAPAROTOMY TRNSV 102X78 (DRAPE) ×4 IMPLANT
DRAPE UTILITY XL STRL (DRAPES) ×10 IMPLANT
DRESSING TELFA 8X3 (GAUZE/BANDAGES/DRESSINGS) ×2 IMPLANT
DRSG TEGADERM 4X4.75 (GAUZE/BANDAGES/DRESSINGS) ×2 IMPLANT
ELECT CAUTERY BLADE 6.4 (BLADE) ×6 IMPLANT
ELECT REM PT RETURN 9FT ADLT (ELECTROSURGICAL) ×4
ELECTRODE REM PT RTRN 9FT ADLT (ELECTROSURGICAL) ×2 IMPLANT
GLOVE BIO SURGEON STRL SZ8 (GLOVE) ×4 IMPLANT
GLOVE BIOGEL PI IND STRL 6 (GLOVE) IMPLANT
GLOVE BIOGEL PI IND STRL 8 (GLOVE) IMPLANT
GLOVE BIOGEL PI INDICATOR 6 (GLOVE) ×2
GLOVE BIOGEL PI INDICATOR 8 (GLOVE) ×2
GLOVE BIOGEL PI ORTHO PRO SZ7 (GLOVE) ×2
GLOVE PI ORTHO PRO STRL SZ7 (GLOVE) IMPLANT
GLOVE SURG SIGNA 7.5 PF LTX (GLOVE) ×4 IMPLANT
GLOVE SURG SS PI 6.5 STRL IVOR (GLOVE) ×6 IMPLANT
GOWN STRL REUS W/ TWL LRG LVL3 (GOWN DISPOSABLE) ×2 IMPLANT
GOWN STRL REUS W/ TWL XL LVL3 (GOWN DISPOSABLE) ×2 IMPLANT
GOWN STRL REUS W/TWL LRG LVL3 (GOWN DISPOSABLE) ×4
GOWN STRL REUS W/TWL XL LVL3 (GOWN DISPOSABLE) ×4
KIT BASIN OR (CUSTOM PROCEDURE TRAY) ×4 IMPLANT
KIT ROOM TURNOVER OR (KITS) ×4 IMPLANT
LIGASURE IMPACT 36 18CM CVD LR (INSTRUMENTS) ×2 IMPLANT
LIQUID BAND (GAUZE/BANDAGES/DRESSINGS) ×4 IMPLANT
MESH HERNIA 3X6 (Mesh General) IMPLANT
NDL HYPO 25GX1X1/2 BEV (NEEDLE) ×2 IMPLANT
NEEDLE HYPO 25GX1X1/2 BEV (NEEDLE) ×4 IMPLANT
NS IRRIG 1000ML POUR BTL (IV SOLUTION) ×4 IMPLANT
PACK SURGICAL SETUP 50X90 (CUSTOM PROCEDURE TRAY) ×4 IMPLANT
PAD ARMBOARD 7.5X6 YLW CONV (MISCELLANEOUS) ×8 IMPLANT
PENCIL BUTTON HOLSTER BLD 10FT (ELECTRODE) ×6 IMPLANT
RELOAD PROXIMATE 75MM BLUE (ENDOMECHANICALS) ×8 IMPLANT
RELOAD STAPLE 75 3.8 BLU REG (ENDOMECHANICALS) IMPLANT
SPECIMEN JAR SMALL (MISCELLANEOUS) ×2 IMPLANT
SPONGE LAP 18X18 X RAY DECT (DISPOSABLE) ×6 IMPLANT
STAPLER GUN LINEAR PROX 60 (STAPLE) ×2 IMPLANT
STAPLER PROXIMATE 75MM BLUE (STAPLE) ×2 IMPLANT
SUT ETHIBOND NAB CT1 #1 30IN (SUTURE) ×6 IMPLANT
SUT MNCRL AB 4-0 PS2 18 (SUTURE) ×2 IMPLANT
SUT MON AB 4-0 PC3 18 (SUTURE) ×4 IMPLANT
SUT PROLENE 0 CT 2 (SUTURE) ×2 IMPLANT
SUT SILK 2 0 SH (SUTURE) IMPLANT
SUT SILK 2 0 SH CR/8 (SUTURE) ×2 IMPLANT
SUT SILK 3 0 SH CR/8 (SUTURE) ×2 IMPLANT
SUT VIC AB 2-0 CT1 27 (SUTURE) ×8
SUT VIC AB 2-0 CT1 TAPERPNT 27 (SUTURE) ×4 IMPLANT
SUT VIC AB 3-0 CT1 27 (SUTURE) ×4
SUT VIC AB 3-0 CT1 TAPERPNT 27 (SUTURE) ×2 IMPLANT
SYR BULB IRRIGATION 50ML (SYRINGE) ×2 IMPLANT
SYR CONTROL 10ML LL (SYRINGE) ×4 IMPLANT
TOWEL OR 17X24 6PK STRL BLUE (TOWEL DISPOSABLE) ×4 IMPLANT
TOWEL OR 17X26 10 PK STRL BLUE (TOWEL DISPOSABLE) ×4 IMPLANT
TUBE CONNECTING 12'X1/4 (SUCTIONS) ×1
TUBE CONNECTING 12X1/4 (SUCTIONS) ×1 IMPLANT
YANKAUER SUCT BULB TIP NO VENT (SUCTIONS) ×4 IMPLANT

## 2014-04-02 NOTE — H&P (Signed)
Chief Complaint: abdominal pain HPI: Alex Arias is a 59 year old male with a history of bilateral inguinal hernia repair in 2013 and hypertension presenting with periumbilical and chest pains.  Onset was sudden.  Coarse is unchanged.  Severe is severity.  Sharp, constant pain.  No aggravating or alleviating factors.  No modifying factors. He reports having a hernia for 6 months, but has never been bothersome until this morning.  Associated with nausea, vomiting and sweating.  He has been NPO since 9AM.  He had a loose BM this AM.  Denies previous obstructive symptoms. He was seen by Dr. Rosendo Gros on Monday and being scheduled for a repair.  He is otherwise healthy.  He denies DOE or chest pains prior to today.  He had a CTA of chest and abdomen which was negative for an aneurysm, but significant for incarcerated right inguinal hernia.  WBC 11.8 and lactic acid of 3.46.  We have therefore been asked to admit.    Past Medical History  Diagnosis Date  . Right inguinal hernia   . Abdominal pain   . Abdominal distension   . Cough   . Itching   . Pleurisy   . Unspecified viral infection, in conditions classified elsewhere and of unspecified site     viral syndrome - per notes from [place] dated 01/03/11  . Family history of diabetes mellitus     Past Surgical History  Procedure Laterality Date  . Wisdom tooth extraction    . Inguinal hernia repair  02/01/2011    Procedure: LAPAROSCOPIC INGUINAL HERNIA;  Surgeon: Stark Klein, MD;  Location: [place];  Service: General;  Laterality: Bilateral;  Laparoscopic inguinal repair with mesh - Bilateral    History reviewed. No pertinent family history. Social History:  reports that he has never smoked. He has never used smokeless tobacco. He reports that he drinks alcohol. He reports that he does not use illicit drugs.  Allergies: No Known Allergies Medication  Prior to Admission medications   Medication Sig Start Date End Date Taking? Authorizing  Provider  aspirin 325 MG EC tablet Take 325 mg by mouth every 6 (six) hours as needed for pain.   Yes Historical Provider, MD  bismuth subsalicylate (PEPTO BISMOL) 262 MG/15ML suspension Take 15 mLs by mouth every 6 (six) hours as needed. For stomach upset    Yes Historical Provider, MD  lisinopril-hydrochlorothiazide (PRINZIDE,ZESTORETIC) 20-12.5 MG per tablet Take 1 tablet by mouth daily.   Yes Historical Provider, MD  Multiple Vitamin (MULITIVITAMIN WITH MINERALS) TABS Take 1 tablet by mouth daily.     Yes Historical Provider, MD     (Not in a hospital admission)  Results for orders placed or performed during the hospital encounter of 04/02/14 (from the past 48 hour(s))  Protime-INR     Status: None   Collection Time: 04/02/14  1:50 PM  Result Value Ref Range   Prothrombin Time 13.1 11.6 - 15.2 seconds   INR 0.98 0.00 - 1.49  CBC with Differential     Status: Abnormal   Collection Time: 04/02/14  1:50 PM  Result Value Ref Range   WBC 11.8 (H) 4.0 - 10.5 K/uL   RBC 5.15 4.22 - 5.81 MIL/uL   Hemoglobin 15.8 13.0 - 17.0 g/dL   HCT 43.9 39.0 - 52.0 %   MCV 85.2 78.0 - 100.0 fL   MCH 30.7 26.0 - 34.0 pg   MCHC 36.0 30.0 - 36.0 g/dL   RDW 13.2 11.5 - 15.5 %  Platelets 165 150 - 400 K/uL   Neutrophils Relative % 80 (H) 43 - 77 %   Neutro Abs 9.5 (H) 1.7 - 7.7 K/uL   Lymphocytes Relative 11 (L) 12 - 46 %   Lymphs Abs 1.3 0.7 - 4.0 K/uL   Monocytes Relative 7 3 - 12 %   Monocytes Absolute 0.8 0.1 - 1.0 K/uL   Eosinophils Relative 2 0 - 5 %   Eosinophils Absolute 0.2 0.0 - 0.7 K/uL   Basophils Relative 0 0 - 1 %   Basophils Absolute 0.0 0.0 - 0.1 K/uL  Comprehensive metabolic panel     Status: Abnormal   Collection Time: 04/02/14  1:50 PM  Result Value Ref Range   Sodium 136 135 - 145 mmol/L   Potassium 3.5 3.5 - 5.1 mmol/L   Chloride 101 96 - 112 mmol/L   CO2 20 19 - 32 mmol/L   Glucose, Bld 153 (H) 70 - 99 mg/dL   BUN 15 6 - 23 mg/dL   Creatinine, Ser 1.13 0.50 - 1.35 mg/dL    Calcium 9.0 8.4 - 10.5 mg/dL   Total Protein 6.8 6.0 - 8.3 g/dL   Albumin 4.1 3.5 - 5.2 g/dL   AST 31 0 - 37 U/L   ALT 31 0 - 53 U/L   Alkaline Phosphatase 55 39 - 117 U/L   Total Bilirubin 0.9 0.3 - 1.2 mg/dL   GFR calc non Af Amer 68 (L) >90 mL/min   GFR calc Af Amer 79 (L) >90 mL/min    Comment: (NOTE) The eGFR has been calculated using the CKD EPI equation. This calculation has not been validated in all clinical situations. eGFR's persistently <90 mL/min signify possible Chronic Kidney Disease.    Anion gap 15 5 - 15  Lipase, blood     Status: None   Collection Time: 04/02/14  1:50 PM  Result Value Ref Range   Lipase 26 11 - 59 U/L  I-stat troponin, ED     Status: None   Collection Time: 04/02/14  1:58 PM  Result Value Ref Range   Troponin i, poc 0.00 0.00 - 0.08 ng/mL   Comment 3            Comment: Due to the release kinetics of cTnI, a negative result within the first hours of the onset of symptoms does not rule out myocardial infarction with certainty. If myocardial infarction is still suspected, repeat the test at appropriate intervals.   I-Stat CG4 Lactic Acid, ED     Status: Abnormal   Collection Time: 04/02/14  1:59 PM  Result Value Ref Range   Lactic Acid, Venous 3.46 (HH) 0.5 - 2.0 mmol/L   Comment NOTIFIED PHYSICIAN   I-stat Chem 8, ED     Status: Abnormal   Collection Time: 04/02/14  2:00 PM  Result Value Ref Range   Sodium 137 135 - 145 mmol/L   Potassium 3.7 3.5 - 5.1 mmol/L   Chloride 104 96 - 112 mmol/L   BUN 19 6 - 23 mg/dL   Creatinine, Ser 1.00 0.50 - 1.35 mg/dL   Glucose, Bld 152 (H) 70 - 99 mg/dL   Calcium, Ion 1.03 (L) 1.13 - 1.30 mmol/L   TCO2 19 0 - 100 mmol/L   Hemoglobin 16.0 13.0 - 17.0 g/dL   HCT 47.0 39.0 - 52.0 %  Type and screen     Status: None (Preliminary result)   Collection Time: 04/02/14  3:43 PM  Result Value Ref  Range   ABO/RH(D) B POS    Antibody Screen PENDING    Sample Expiration 04/05/2014    Ct Angio Chest  Aorta W/cm &/or Wo/cm  04/02/2014   CLINICAL DATA:  Chest pain for 1 hour.  Diaphoresis.  EXAM: CT ANGIOGRAPHY CHEST AND ABDOMEN  TECHNIQUE: Multidetector CT imaging of the chest and abdomen was performed using the standard protocol during bolus administration of intravenous contrast. Multiplanar CT image reconstructions and MIPs were obtained to evaluate the vascular anatomy.  CONTRAST:  155m OMNIPAQUE IOHEXOL 350 MG/ML SOLN  COMPARISON:  None.  FINDINGS: CTA CHEST FINDINGS  No evidence of intramural hematoma, aortic dissection, or aortic aneurysm maximal transverse diameter of the ascending aorta is 3.5 cm. Some motion artifact in the arch is noted. Great vessels are widely patent within the confines of the examination. There are no filling defects in the pulmonary arterial tree to suggest acute pulmonary thromboembolism.  No pneumothorax.  No pleural effusion.  No abnormal mediastinal adenopathy.  Trace LAD coronary artery calcification.  Clear lungs.  No acute bony deformity. Chronic right rib deformity. No vertebral compression deformity.  Review of the MIP images confirms the above findings.  CTA ABDOMEN FINDINGS  No evidence of aortic aneurysm or aortic dissection. Minimal atherosclerotic calcifications of the infrarenal aorta.  Celiac is patent. Branch vessels are patent. SMA is patent. Branch vessels are grossly patent.  Renal artery origins are patent. There is narrowing in the mid right renal artery approaching 50%. The mid left renal artery is lobulated with a beaded pattern. These findings are compatible with fibromuscular dysplasia.  IMA is diminutive and patent.  Bilateral common, internal, and external iliac arteries are patent.  Liver, spleen, pancreas, gallbladder, and adrenal glands are within normal limits.  Kidneys are unremarkable.  Sigmoid diverticulosis without definitive evidence of acute diverticulitis.  Bladder and prostate are unremarkable.  Normal appendix  Right inguinal hernia  contains adipose tissue and small bowel loops. There is fluid in the hernia sac. There is also suspected to be wall edema in the loop of small bowel. A tiny loculated gas is associated with the small bowel loop and probably intraluminal gas. There is no evidence of bowel obstruction.  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  Review of the MIP images confirms the above findings.  IMPRESSION: No evidence of aortic dissection or intramural hematoma.  Fibromuscular dysplasia of the renal arteries. Correlate with the presence of uncontrolled hypertension.  Right inguinal hernia contains small bowel loops. There is no evidence of bowel obstruction. There is suspected to be wall edema. There is also fluid in the hernia sac. These findings raise the possibility of an inflammatory process or vascular compromise of the small bowel loop.   Electronically Signed   By: AMarybelle KillingsM.D.   On: 04/02/2014 14:50   Ct Angio Abd/pel W/ And/or W/o  04/02/2014   CLINICAL DATA:  Chest pain for 1 hour.  Diaphoresis.  EXAM: CT ANGIOGRAPHY CHEST AND ABDOMEN  TECHNIQUE: Multidetector CT imaging of the chest and abdomen was performed using the standard protocol during bolus administration of intravenous contrast. Multiplanar CT image reconstructions and MIPs were obtained to evaluate the vascular anatomy.  CONTRAST:  1026mOMNIPAQUE IOHEXOL 350 MG/ML SOLN  COMPARISON:  None.  FINDINGS: CTA CHEST FINDINGS  No evidence of intramural hematoma, aortic dissection, or aortic aneurysm maximal transverse diameter of the ascending aorta is 3.5 cm. Some motion artifact in the arch is noted. Great vessels are widely patent  within the confines of the examination. There are no filling defects in the pulmonary arterial tree to suggest acute pulmonary thromboembolism.  No pneumothorax.  No pleural effusion.  No abnormal mediastinal adenopathy.  Trace LAD coronary artery calcification.  Clear lungs.  No acute bony deformity. Chronic  right rib deformity. No vertebral compression deformity.  Review of the MIP images confirms the above findings.  CTA ABDOMEN FINDINGS  No evidence of aortic aneurysm or aortic dissection. Minimal atherosclerotic calcifications of the infrarenal aorta.  Celiac is patent. Branch vessels are patent. SMA is patent. Branch vessels are grossly patent.  Renal artery origins are patent. There is narrowing in the mid right renal artery approaching 50%. The mid left renal artery is lobulated with a beaded pattern. These findings are compatible with fibromuscular dysplasia.  IMA is diminutive and patent.  Bilateral common, internal, and external iliac arteries are patent.  Liver, spleen, pancreas, gallbladder, and adrenal glands are within normal limits.  Kidneys are unremarkable.  Sigmoid diverticulosis without definitive evidence of acute diverticulitis.  Bladder and prostate are unremarkable.  Normal appendix  Right inguinal hernia contains adipose tissue and small bowel loops. There is fluid in the hernia sac. There is also suspected to be wall edema in the loop of small bowel. A tiny loculated gas is associated with the small bowel loop and probably intraluminal gas. There is no evidence of bowel obstruction.  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  Review of the MIP images confirms the above findings.  IMPRESSION: No evidence of aortic dissection or intramural hematoma.  Fibromuscular dysplasia of the renal arteries. Correlate with the presence of uncontrolled hypertension.  Right inguinal hernia contains small bowel loops. There is no evidence of bowel obstruction. There is suspected to be wall edema. There is also fluid in the hernia sac. These findings raise the possibility of an inflammatory process or vascular compromise of the small bowel loop.   Electronically Signed   By: Marybelle Killings M.D.   On: 04/02/2014 14:50    Review of Systems  All other systems reviewed and are negative.   Blood  pressure 163/88, pulse 57, resp. rate 18, SpO2 100 %. Physical Exam  Constitutional: He appears well-developed and well-nourished. No distress.  Cardiovascular: Normal rate, regular rhythm, normal heart sounds and intact distal pulses.  Exam reveals no gallop and no friction rub.   No murmur heard. Respiratory: Effort normal and breath sounds normal. No respiratory distress. He has no wheezes. He has no rales. He exhibits no tenderness.  GI: Soft. Bowel sounds are normal. He exhibits no distension.  TTP to RLQ, incarcerated right inguinal hernia without surrounding erythema.  No peritonitis.    Musculoskeletal: Normal range of motion. He exhibits no edema or tenderness.  Skin: Skin is warm and dry. No rash noted. He is not diaphoretic. No erythema. No pallor.  Psychiatric: He has a normal mood and affect. His behavior is normal. Judgment and thought content normal.     Assessment/Plan Incarcerated right inguinal hernia -To OR urgently for repair -IVF -NPO(since 9AM) -pain control -anti-emetics -SCD, post op lovenox HTN -home meds   Elisha Cooksey ANP-BC 04/02/2014, 4:20 PM

## 2014-04-02 NOTE — ED Provider Notes (Signed)
CSN: [id number]     Arrival date & time 04/02/14  1326 History   First MD Initiated Contact with Patient 04/02/14 1341     Chief Complaint  Patient presents with  . Abdominal Pain  . Chest Pain     (Consider location/radiation/quality/duration/timing/severity/associated sxs/prior Treatment) HPI The patient had sudden onset of severe pain this morning. He reports that he felt fine and then he developed intense pain in his central abdomen and epigastrium that radiated up into his chest. His wife reports that he became pale and sweaty. Patient denies he's ever had a similar type of pain. He describes it is fairly sudden in onset. He had felt well in the morning. They had eaten a light breakfast together. And then the pain seemed to start suddenly unprovoked. He's never had anything similar. The patient reports that he knows he has a right inguinal hernia but that has not been causing him pain. He was evaluated on Monday by [place] surgery Dr. Quay Burowodrigues for elective treatment. He denies that the pain is localizing into that area. He does have borderline hypertension that is controlled on medications. Past Medical History  Diagnosis Date  . Right inguinal hernia   . Abdominal pain   . Abdominal distension   . Cough   . Itching   . Pleurisy   . Unspecified viral infection, in conditions classified elsewhere and of unspecified site     viral syndrome - per notes from [facility] dated 01/03/11  . Family history of diabetes mellitus    Past Surgical History  Procedure Laterality Date  . Wisdom tooth extraction    . Inguinal hernia repair  02/01/2011    Procedure: LAPAROSCOPIC INGUINAL HERNIA;  Surgeon: Almond LintFaera Byerly, MD;  Location: [facility] OR;  Service: General;  Laterality: Bilateral;  Laparoscopic inguinal repair with mesh - Bilateral   History reviewed. No pertinent family history. History  Substance Use Topics  . Smoking status: Never Smoker   . Smokeless tobacco: Never Used  . Alcohol Use:  Yes     Comment: 1 - 3 wine or beer weekly    Review of Systems 10 Systems reviewed and are negative for acute change except as noted in the HPI.   Allergies  Review of patient's allergies indicates no known allergies.  Home Medications   Prior to Admission medications   Medication Sig Start Date End Date Taking? Authorizing Provider  aspirin 325 MG EC tablet Take 325 mg by mouth every 6 (six) hours as needed for pain.   Yes Historical Provider, MD  bismuth subsalicylate (PEPTO BISMOL) 262 MG/15ML suspension Take 15 mLs by mouth every 6 (six) hours as needed. For stomach upset    Yes Historical Provider, MD  lisinopril-hydrochlorothiazide (PRINZIDE,ZESTORETIC) 20-12.5 MG per tablet Take 1 tablet by mouth daily.   Yes Historical Provider, MD  Multiple Vitamin (MULITIVITAMIN WITH MINERALS) TABS Take 1 tablet by mouth daily.     Yes Historical Provider, MD   BP 148/90 mmHg  Pulse 58  Resp 16  SpO2 96% Physical Exam  Constitutional: He is oriented to person, place, and time.  The patient appears to be in severe pain. His mental status is clear. He is not in acute respiratory distress. Well-nourished well-developed.  HENT:  Head: Normocephalic and atraumatic.  Mouth/Throat: Oropharynx is clear and moist.  Eyes: EOM are normal.  Neck: Neck supple.  Cardiovascular: Normal rate, regular rhythm, normal heart sounds and intact distal pulses.   Pulmonary/Chest: Effort normal and breath sounds normal. No  respiratory distress.  Abdominal: Soft. Bowel sounds are normal. There is tenderness.  Patient endorses moderate to severe tenderness in the epigastrium in the center upper umbilical area. There is no appreciable mass in this area. There is inguinal mass in the right groin and testicle consistent with a hernia. This is also firm and tender. The testicle and scrotum are smooth and without edema or erythema.  Genitourinary: Penis normal.  Musculoskeletal: He exhibits no edema or tenderness.   Neurological: He is alert and oriented to person, place, and time. He exhibits normal muscle tone. Coordination normal.  Skin: Skin is warm and dry.  Psychiatric: He has a normal mood and affect.    ED Course  Procedures (including critical care time) Labs Review Labs Reviewed  CBC WITH DIFFERENTIAL/PLATELET - Abnormal; Notable for the following:    WBC 11.8 (*)    Neutrophils Relative % 80 (*)    Neutro Abs 9.5 (*)    Lymphocytes Relative 11 (*)    All other components within normal limits  COMPREHENSIVE METABOLIC PANEL - Abnormal; Notable for the following:    Glucose, Bld 153 (*)    GFR calc non Af Amer 68 (*)    GFR calc Af Amer 79 (*)    All other components within normal limits  I-STAT CHEM 8, ED - Abnormal; Notable for the following:    Glucose, Bld 152 (*)    Calcium, Ion 1.03 (*)    All other components within normal limits  I-STAT CG4 LACTIC ACID, ED - Abnormal; Notable for the following:    Lactic Acid, Venous 3.46 (*)    All other components within normal limits  PROTIME-INR  LIPASE, BLOOD  URINALYSIS, ROUTINE W REFLEX MICROSCOPIC  I-STAT TROPOININ, ED  TYPE AND SCREEN    Imaging Review Ct Angio Chest Aorta W/cm &/or Wo/cm  04/02/2014   CLINICAL DATA:  Chest pain for 1 hour.  Diaphoresis.  EXAM: CT ANGIOGRAPHY CHEST AND ABDOMEN  TECHNIQUE: Multidetector CT imaging of the chest and abdomen was performed using the standard protocol during bolus administration of intravenous contrast. Multiplanar CT image reconstructions and MIPs were obtained to evaluate the vascular anatomy.  CONTRAST:  OMNIPAQUE IOHEXOL 350 MG/ML SOLN  COMPARISON:  None.  FINDINGS: CTA CHEST FINDINGS  No evidence of intramural hematoma, aortic dissection, or aortic aneurysm maximal transverse diameter of the ascending aorta is 3.5 cm. Some motion artifact in the arch is noted. Great vessels are widely patent within the confines of the examination. There are no filling defects in the pulmonary  arterial tree to suggest acute pulmonary thromboembolism.  No pneumothorax.  No pleural effusion.  No abnormal mediastinal adenopathy.  Trace LAD coronary artery calcification.  Clear lungs.  No acute bony deformity. Chronic right rib deformity. No vertebral compression deformity.  Review of the MIP images confirms the above findings.  CTA ABDOMEN FINDINGS  No evidence of aortic aneurysm or aortic dissection. Minimal atherosclerotic calcifications of the infrarenal aorta.  Celiac is patent. Branch vessels are patent. SMA is patent. Branch vessels are grossly patent.  Renal artery origins are patent. There is narrowing in the mid right renal artery approaching 50%. The mid left renal artery is lobulated with a beaded pattern. These findings are compatible with fibromuscular dysplasia.  IMA is diminutive and patent.  Bilateral common, internal, and external iliac arteries are patent.  Liver, spleen, pancreas, gallbladder, and adrenal glands are within normal limits.  Kidneys are unremarkable.  Sigmoid diverticulosis without definitive evidence of  acute diverticulitis.  Bladder and prostate are unremarkable.  Normal appendix  Right inguinal hernia contains adipose tissue and small bowel loops. There is fluid in the hernia sac. There is also suspected to be wall edema in the loop of small bowel. A tiny loculated gas is associated with the small bowel loop and probably intraluminal gas. There is no evidence of bowel obstruction.  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  Review of the MIP images confirms the above findings.  IMPRESSION: No evidence of aortic dissection or intramural hematoma.  Fibromuscular dysplasia of the renal arteries. Correlate with the presence of uncontrolled hypertension.  Right inguinal hernia contains small bowel loops. There is no evidence of bowel obstruction. There is suspected to be wall edema. There is also fluid in the hernia sac. These findings raise the possibility of an  inflammatory process or vascular compromise of the small bowel loop.   Electronically Signed   By: Jolaine Click M.D.   On: 04/02/2014 14:50   Ct Angio Abd/pel W/ And/or W/o  04/02/2014   CLINICAL DATA:  Chest pain for 1 hour.  Diaphoresis.  EXAM: CT ANGIOGRAPHY CHEST AND ABDOMEN  TECHNIQUE: Multidetector CT imaging of the chest and abdomen was performed using the standard protocol during bolus administration of intravenous contrast. Multiplanar CT image reconstructions and MIPs were obtained to evaluate the vascular anatomy.  CONTRAST:  OMNIPAQUE IOHEXOL 350 MG/ML SOLN  COMPARISON:  None.  FINDINGS: CTA CHEST FINDINGS  No evidence of intramural hematoma, aortic dissection, or aortic aneurysm maximal transverse diameter of the ascending aorta is 3.5 cm. Some motion artifact in the arch is noted. Great vessels are widely patent within the confines of the examination. There are no filling defects in the pulmonary arterial tree to suggest acute pulmonary thromboembolism.  No pneumothorax.  No pleural effusion.  No abnormal mediastinal adenopathy.  Trace LAD coronary artery calcification.  Clear lungs.  No acute bony deformity. Chronic right rib deformity. No vertebral compression deformity.  Review of the MIP images confirms the above findings.  CTA ABDOMEN FINDINGS  No evidence of aortic aneurysm or aortic dissection. Minimal atherosclerotic calcifications of the infrarenal aorta.  Celiac is patent. Branch vessels are patent. SMA is patent. Branch vessels are grossly patent.  Renal artery origins are patent. There is narrowing in the mid right renal artery approaching 50%. The mid left renal artery is lobulated with a beaded pattern. These findings are compatible with fibromuscular dysplasia.  IMA is diminutive and patent.  Bilateral common, internal, and external iliac arteries are patent.  Liver, spleen, pancreas, gallbladder, and adrenal glands are within normal limits.  Kidneys are unremarkable.  Sigmoid  diverticulosis without definitive evidence of acute diverticulitis.  Bladder and prostate are unremarkable.  Normal appendix  Right inguinal hernia contains adipose tissue and small bowel loops. There is fluid in the hernia sac. There is also suspected to be wall edema in the loop of small bowel. A tiny loculated gas is associated with the small bowel loop and probably intraluminal gas. There is no evidence of bowel obstruction.  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  Review of the MIP images confirms the above findings.  IMPRESSION: No evidence of aortic dissection or intramural hematoma.  Fibromuscular dysplasia of the renal arteries. Correlate with the presence of uncontrolled hypertension.  Right inguinal hernia contains small bowel loops. There is no evidence of bowel obstruction. There is suspected to be wall edema. There is also fluid in the  hernia sac. These findings raise the possibility of an inflammatory process or vascular compromise of the small bowel loop.   Electronically Signed   By: Jolaine Click M.D.   On: 04/02/2014 14:50   Consult   EKG Interpretation None     Consult: Patient's case was reviewed general surgery (16:05). The patient will be assessed in the emergency department. MDM   Final diagnoses:  Incarcerated inguinal hernia   The patient's initial presentation was concerning for aortic aneurysm or dissection. He described acute and severe onset of pain in his epigastrium and supraumbilical area that radiated up into his chest. At home the patient became pale and diaphoretic. He did not identify the known inguinal hernia as a source of localizing pain. Diagnostic workup however has ruled out other intra-abdominal pathology and at this time the positive finding is for a firm and tender a little hernia which the patient is not tolerating reduction. Gen. surgery has been consult it and will evaluate the patient in emergency department.    Arby Barrette,  MD 04/02/14 [phone]

## 2014-04-02 NOTE — ED Notes (Signed)
Lab results reported to Dr.Pferffer.

## 2014-04-02 NOTE — Progress Notes (Signed)
Patient ID: Alex Arias, male   DOB: 10/26/1955, 59 y.o.   MRN: [id number]   Patient with an incarcerated recurrent right inguinal hernia.  Needs urgent repair.  I discussed this with the patient and his wife in detail.  I discussed the risks which include but are not limited to bleeding, infection, injury to surrounding structures, chronic pain, recurrence, use of mesh, cardiopulmonary problems, need for bowel resection, DVT, etc.  He agrees to proceed.

## 2014-04-02 NOTE — Anesthesia Preprocedure Evaluation (Signed)
Anesthesia Evaluation  Patient identified by MRN, date of birth, ID band Patient awake    Reviewed: Allergy & Precautions, H&P , NPO status , Patient's Chart, lab work & pertinent test results  Airway Mallampati: II  TM Distance: >3 FB Neck ROM: Full    Dental  (+) Teeth Intact   Pulmonary neg pulmonary ROS,  breath sounds clear to auscultation        Cardiovascular hypertension, Rhythm:Regular Rate:Normal     Neuro/Psych negative neurological ROS     GI/Hepatic negative GI ROS, Neg liver ROS,   Endo/Other  negative endocrine ROS  Renal/GU negative Renal ROS     Musculoskeletal negative musculoskeletal ROS (+)   Abdominal   Peds negative pediatric ROS (+)  Hematology negative hematology ROS (+)   Anesthesia Other Findings   Reproductive/Obstetrics negative OB ROS                             Anesthesia Physical  Anesthesia Plan  ASA: II  Anesthesia Plan: General   Post-op Pain Management:    Induction: Intravenous  Airway Management Planned: Oral ETT  Additional Equipment:   Intra-op Plan:   Post-operative Plan: Extubation in OR  Informed Consent: I have reviewed the patients History and Physical, chart, labs and discussed the procedure including the risks, benefits and alternatives for the proposed anesthesia with the patient or authorized representative who has indicated his/her understanding and acceptance.   Dental advisory given  Plan Discussed with: CRNA, Anesthesiologist and Surgeon  Anesthesia Plan Comments:         Anesthesia Quick Evaluation

## 2014-04-02 NOTE — ED Notes (Signed)
Pt c/o CP and abd pain starting 1 hour ago; pt diaphoretic and pale at present

## 2014-04-02 NOTE — Transfer of Care (Signed)
Immediate Anesthesia Transfer of Care Note  Patient: Alex Arias  Procedure(s) Performed: Procedure(s): HERNIA REPAIR INGUINAL INCARCERATED  (Right) SMALL BOWEL RESECTION (N/A)  Patient Location: PACU  Anesthesia Type:General  Level of Consciousness: awake, alert  and oriented  Airway & Oxygen Therapy: Patient Spontanous Breathing and Patient connected to nasal cannula oxygen  Post-op Assessment: Report given to RN, Post -op Vital signs reviewed and stable and Patient moving all extremities  Post vital signs: Reviewed and stable  Last Vitals:  Filed Vitals:   04/02/14 1950  BP: 145/94  Pulse: 86  Temp:   Resp: 14    Complications: No apparent anesthesia complications

## 2014-04-02 NOTE — Op Note (Signed)
04/02/2014  7:40 PM  PATIENT:  Alex Arias  59 y.o. male  PRE-OPERATIVE DIAGNOSIS:  Incarcerated Recurrent Right Inguinal Hernia  POST-OPERATIVE DIAGNOSIS:  Incarcerated Recurrent Right Inguinal hernia with Necrotic Small Bowel  PROCEDURE:  Procedure(s): REPAIR INCARCERATED RECURRENT RIGHT INGUINAL HERNIA  SMALL BOWEL RESECTION  SURGEON:  Surgeon(s): Violeta GelinasBurke Jermeka Schlotterbeck, MD  ASSISTANTS: none   ANESTHESIA:   local and general  EBL:     BLOOD ADMINISTERED:none  DRAINS: none   SPECIMEN:  Excision  DISPOSITION OF SPECIMEN:  PATHOLOGY  COUNTS:  YES  DICTATION: .Dragon Dictation ) status post laparoscopic bilateral inguinal hernia repair in 2013. He suffered a recurrence. He was evaluated by one of my partners earlier this week and was planning to schedule elective repair of recurrent inguinal hernia. This morning he had sudden generalized abdominal pain and nausea. His hernia was no longer reducible. It became significantly painful. He came to the emergency department and was evaluated. CT scan was done demonstrating right inguinal hernia containing thickened small bowel. He is brought for emergent repair of this recurrent, incarcerated right inguinal hernia. Informed consent was obtained. His site was marked. He received intravenous antibiotics. He was brought to the operative room and general endotracheal anesthesia was administered by the anesthesia staff. His abdomen and groin and genitalia were prepped and draped in sterile fashion. We did time out procedure. Right inguinal area was infiltrated with local. Additional local was placed out towards the anterior superior iliac spine for postoperative pain relief. Right inguinal incision was made. Subcutaneous tissues were dissected down revealing the hernia sac. The external oblique was very attenuated. What little was left was divided out laterally. The hernia sac was tense and appeared to contain dark fluid. Cord structures were identified  at the entrance of the scrotum. Hernia sac was then carefully opened. There was serous sanguinous fluid inside. There was also a loop of small bowel with early necrosis. Some of the sac was dissected away from cord structures and excised. I then opened the hernia defect, which was very small, medially and laterally. This allowed more small bowel to be brought out into the wound. The segment of congestion and early necrotic changes was about 6 cm long. Normal bowel was present on each side. Bowel proximally and distally was divided with a GIA-75 stapler. The mesentery was divided with LigaSure device achieving excellent hemostasis. Small bowel specimen was passed and sent to pathology. Next, the hernia opening was opened slightly more to allow free movement of the 2 ends of stapled small bowel. A side-to-side anastomosis was then performed with GIA-75 stapler. There was no bleeding from the staple line inside. The common defect was closed with TA 60. Multiple 3-0 silk sutures were placed along the staple line to reinforce and get good hemostasis. There was a patent anastomosis. An apex stitch of 2-0 silk was then placed. The anastomosis was observed and stayed nice and pink. It was reduced easily back into the peritoneal cavity. Wound was irrigated. Some further portions of the sac were debrided off of the spermatic cord. Decision was then made to forego placement of mesh due to risk of infection. Further dissection revealed the shelving edge of inguinal ligament and transversalis superiorly. Tissues were quite attenuated. Hernia was repaired primarily with #1 Ethibond. Shelving edge of inguinal ligament was tacked to the transversalis medially starting over the pubic tubercle. As left for the cord and further stitches were placed out laterally securely closing the defect. Space next to the cord could  admit the tip of the fifth digit and the cord structures remained viable and nonedematous. Testicle was relocated  appropriately down in the scrotum. Wound was copiously irrigated. Hemostasis was ensured. Deep tissues were closed with running 2-0 Vicryl suture. Subcutaneous tissues were closed with interrupted 3-0 Vicryl suture. The skin was closed with running 4-0 Monocryl subcuticular stitch followed by liquid band. All counts were correct. Patient tolerated procedure well without apparent complication and was taken recovery room in stable condition. Nasogastric tube was left in place.  PATIENT DISPOSITION:  PACU - hemodynamically stable.   Delay start of Pharmacological VTE agent (>24hrs) due to surgical blood loss or risk of bleeding:  no  Violeta Gelinas, MD, MPH, FACS Pager: [phone]  3/17/20167:40 PM

## 2014-04-03 ENCOUNTER — Encounter (HOSPITAL_COMMUNITY): Payer: Self-pay

## 2014-04-03 LAB — BASIC METABOLIC PANEL
Anion gap: 7 (ref 5–15)
BUN: 11 mg/dL (ref 6–23)
CALCIUM: 8.8 mg/dL (ref 8.4–10.5)
CO2: 27 mmol/L (ref 19–32)
Chloride: 101 mmol/L (ref 96–112)
Creatinine, Ser: 1.12 mg/dL (ref 0.50–1.35)
GFR calc Af Amer: 80 mL/min — ABNORMAL LOW (ref >90)
GFR, EST NON AFRICAN AMERICAN: 69 mL/min — AB (ref >90)
Glucose, Bld: 153 mg/dL — ABNORMAL HIGH (ref 70–99)
Potassium: 5.2 mmol/L — ABNORMAL HIGH (ref 3.5–5.1)
Sodium: 135 mmol/L (ref 135–145)

## 2014-04-03 LAB — CBC
HCT: 42.5 % (ref 39.0–52.0)
HEMOGLOBIN: 15.1 g/dL (ref 13.0–17.0)
MCH: 30.8 pg (ref 26.0–34.0)
MCHC: 35.5 g/dL (ref 30.0–36.0)
MCV: 86.7 fL (ref 78.0–100.0)
Platelets: 173 10*3/uL (ref 150–400)
RBC: 4.9 MIL/uL (ref 4.22–5.81)
RDW: 13.5 % (ref 11.5–15.5)
WBC: 10.7 10*3/uL — ABNORMAL HIGH (ref 4.0–10.5)

## 2014-04-03 MED ORDER — PHENOL 1.4 % MT LIQD
2.0000 | OROMUCOSAL | Status: DC | PRN
  Administered 2014-04-03: 2 via OROMUCOSAL
  Filled 2014-04-03: qty 177

## 2014-04-03 MED ORDER — DEXTROSE-NACL 5-0.45 % IV SOLN
INTRAVENOUS | Status: DC
  Administered 2014-04-03 – 2014-04-06 (×7): via INTRAVENOUS

## 2014-04-03 MED ORDER — DIPHENHYDRAMINE HCL 50 MG/ML IJ SOLN
12.5000 mg | Freq: Four times a day (QID) | INTRAMUSCULAR | Status: DC | PRN
  Administered 2014-04-03 – 2014-04-05 (×7): 25 mg via INTRAVENOUS
  Filled 2014-04-03 (×7): qty 1

## 2014-04-03 MED ORDER — HYDROMORPHONE HCL 1 MG/ML IJ SOLN
0.5000 mg | INTRAMUSCULAR | Status: DC | PRN
  Administered 2014-04-03 – 2014-04-05 (×10): 1 mg via INTRAVENOUS
  Filled 2014-04-03 (×10): qty 1

## 2014-04-03 NOTE — Anesthesia Postprocedure Evaluation (Signed)
Anesthesia Post Note  Patient: Alex Arias  Procedure(s) Performed: Procedure(s) (LRB): HERNIA REPAIR INGUINAL INCARCERATED  (Right) SMALL BOWEL RESECTION (N/A)  Anesthesia type: General  Patient location: PACU  Post pain: Pain level controlled  Post assessment: Post-op Vital signs reviewed  Last Vitals: BP 144/88 mmHg  Pulse 65  Temp(Src) 36.8 C (Oral)  Resp 18  Ht 6' 5.17" (1.96 m)  Wt 209 lb 7 oz (95 kg)  BMI 24.73 kg/m2  SpO2 96%  Post vital signs: Reviewed  Level of consciousness: sedated  Complications: No apparent anesthesia complications

## 2014-04-03 NOTE — Progress Notes (Signed)
Patient ID: Alex Arias, male   DOB: Dec 13, 1955, 59 y.o.   MRN: [id number] 1 Day Post-Op  Subjective: Pt feels pretty well today.  Biggest complaint is throat pain.  No flatus yet  Objective: Vital signs in last 24 hours: Temp:  [97.5 F (36.4 C)-98.3 F (36.8 C)] 97.5 F (36.4 C) (03/18 1128) Pulse Rate:  [49-94] 49 (03/18 1128) Resp:  [9-20] 12 (03/18 1151) BP: (116-163)/(74-94) 122/74 mmHg (03/18 1128) SpO2:  [96 %-100 %] 97 % (03/18 1151) Weight:  [95 kg (209 lb 7 oz)] 95 kg (209 lb 7 oz) (03/17 2020) Last BM Date: 04/02/14  Intake/Output from previous day: 03/17 0701 - 03/18 0700 In: 2405 [I.V.:2375; NG/GT:30] Out: 950 [Urine:900; Emesis/NG output:50] Intake/Output this shift: Total I/O In: -  Out: 750 [Urine:450; Emesis/NG output:300]  PE: Abd: soft, NGT mostly with clear output, hypoactive BS, minimally tender, RLQ inguinal incision is c/d/i with no evidence of hematoma  Lab Results:   Recent Labs  04/02/14 1350 04/02/14 1400 04/03/14 0428  WBC 11.8*  --  10.7*  HGB 15.8 16.0 15.1  HCT 43.9 47.0 42.5  PLT 165  --  173   BMET  Recent Labs  04/02/14 1350 04/02/14 1400 04/03/14 0428  NA 136 137 135  K 3.5 3.7 5.2*  CL 101 104 101  CO2 20  --  27  GLUCOSE 153* 152* 153*  BUN 15 19 11   CREATININE 1.13 1.00 1.12  CALCIUM 9.0  --  8.8   PT/INR  Recent Labs  04/02/14 1350  LABPROT 13.1  INR 0.98   CMP     Component Value Date/Time   NA 135 04/03/2014 0428   K 5.2* 04/03/2014 0428   CL 101 04/03/2014 0428   CO2 27 04/03/2014 0428   GLUCOSE 153* 04/03/2014 0428   BUN 11 04/03/2014 0428   CREATININE 1.12 04/03/2014 0428   CALCIUM 8.8 04/03/2014 0428   PROT 6.8 04/02/2014 1350   ALBUMIN 4.1 04/02/2014 1350   AST 31 04/02/2014 1350   ALT 31 04/02/2014 1350   ALKPHOS 55 04/02/2014 1350   BILITOT 0.9 04/02/2014 1350   GFRNONAA 69* 04/03/2014 0428   GFRAA 80* 04/03/2014 0428   Lipase     Component Value Date/Time   LIPASE 26 04/02/2014  1350       Studies/Results: Ct Angio Chest Aorta W/cm &/or Wo/cm  04/02/2014   CLINICAL DATA:  Chest pain for 1 hour.  Diaphoresis.  EXAM: CT ANGIOGRAPHY CHEST AND ABDOMEN  TECHNIQUE: Multidetector CT imaging of the chest and abdomen was performed using the standard protocol during bolus administration of intravenous contrast. Multiplanar CT image reconstructions and MIPs were obtained to evaluate the vascular anatomy.  CONTRAST:  100mL OMNIPAQUE IOHEXOL 350 MG/ML SOLN  COMPARISON:  None.  FINDINGS: CTA CHEST FINDINGS  No evidence of intramural hematoma, aortic dissection, or aortic aneurysm maximal transverse diameter of the ascending aorta is 3.5 cm. Some motion artifact in the arch is noted. Great vessels are widely patent within the confines of the examination. There are no filling defects in the pulmonary arterial tree to suggest acute pulmonary thromboembolism.  No pneumothorax.  No pleural effusion.  No abnormal mediastinal adenopathy.  Trace LAD coronary artery calcification.  Clear lungs.  No acute bony deformity. Chronic right rib deformity. No vertebral compression deformity.  Review of the MIP images confirms the above findings.  CTA ABDOMEN FINDINGS  No evidence of aortic aneurysm or aortic dissection. Minimal atherosclerotic calcifications of the infrarenal  aorta.  Celiac is patent. Branch vessels are patent. SMA is patent. Branch vessels are grossly patent.  Renal artery origins are patent. There is narrowing in the mid right renal artery approaching 50%. The mid left renal artery is lobulated with a beaded pattern. These findings are compatible with fibromuscular dysplasia.  IMA is diminutive and patent.  Bilateral common, internal, and external iliac arteries are patent.  Liver, spleen, pancreas, gallbladder, and adrenal glands are within normal limits.  Kidneys are unremarkable.  Sigmoid diverticulosis without definitive evidence of acute diverticulitis.  Bladder and prostate are  unremarkable.  Normal appendix  Right inguinal hernia contains adipose tissue and small bowel loops. There is fluid in the hernia sac. There is also suspected to be wall edema in the loop of small bowel. A tiny loculated gas is associated with the small bowel loop and probably intraluminal gas. There is no evidence of bowel obstruction.  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  Review of the MIP images confirms the above findings.  IMPRESSION: No evidence of aortic dissection or intramural hematoma.  Fibromuscular dysplasia of the renal arteries. Correlate with the presence of uncontrolled hypertension.  Right inguinal hernia contains small bowel loops. There is no evidence of bowel obstruction. There is suspected to be wall edema. There is also fluid in the hernia sac. These findings raise the possibility of an inflammatory process or vascular compromise of the small bowel loop.   Electronically Signed   By: Jolaine Click M.D.   On: 04/02/2014 14:50   Ct Angio Abd/pel W/ And/or W/o  04/02/2014   CLINICAL DATA:  Chest pain for 1 hour.  Diaphoresis.  EXAM: CT ANGIOGRAPHY CHEST AND ABDOMEN  TECHNIQUE: Multidetector CT imaging of the chest and abdomen was performed using the standard protocol during bolus administration of intravenous contrast. Multiplanar CT image reconstructions and MIPs were obtained to evaluate the vascular anatomy.  CONTRAST:  OMNIPAQUE IOHEXOL 350 MG/ML SOLN  COMPARISON:  None.  FINDINGS: CTA CHEST FINDINGS  No evidence of intramural hematoma, aortic dissection, or aortic aneurysm maximal transverse diameter of the ascending aorta is 3.5 cm. Some motion artifact in the arch is noted. Great vessels are widely patent within the confines of the examination. There are no filling defects in the pulmonary arterial tree to suggest acute pulmonary thromboembolism.  No pneumothorax.  No pleural effusion.  No abnormal mediastinal adenopathy.  Trace LAD coronary artery  calcification.  Clear lungs.  No acute bony deformity. Chronic right rib deformity. No vertebral compression deformity.  Review of the MIP images confirms the above findings.  CTA ABDOMEN FINDINGS  No evidence of aortic aneurysm or aortic dissection. Minimal atherosclerotic calcifications of the infrarenal aorta.  Celiac is patent. Branch vessels are patent. SMA is patent. Branch vessels are grossly patent.  Renal artery origins are patent. There is narrowing in the mid right renal artery approaching 50%. The mid left renal artery is lobulated with a beaded pattern. These findings are compatible with fibromuscular dysplasia.  IMA is diminutive and patent.  Bilateral common, internal, and external iliac arteries are patent.  Liver, spleen, pancreas, gallbladder, and adrenal glands are within normal limits.  Kidneys are unremarkable.  Sigmoid diverticulosis without definitive evidence of acute diverticulitis.  Bladder and prostate are unremarkable.  Normal appendix  Right inguinal hernia contains adipose tissue and small bowel loops. There is fluid in the hernia sac. There is also suspected to be wall edema in the loop of small bowel. A tiny  loculated gas is associated with the small bowel loop and probably intraluminal gas. There is no evidence of bowel obstruction.  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  Review of the MIP images confirms the above findings.  IMPRESSION: No evidence of aortic dissection or intramural hematoma.  Fibromuscular dysplasia of the renal arteries. Correlate with the presence of uncontrolled hypertension.  Right inguinal hernia contains small bowel loops. There is no evidence of bowel obstruction. There is suspected to be wall edema. There is also fluid in the hernia sac. These findings raise the possibility of an inflammatory process or vascular compromise of the small bowel loop.   Electronically Signed   By: Jolaine Click M.D.   On: 04/02/2014 14:50     Anti-infectives: Anti-infectives    Start     Dose/Rate Route Frequency Ordered Stop   04/03/14 0600  cefOXitin (MEFOXIN) 2 g in dextrose 5 % 50 mL IVPB  Status:  Discontinued    Comments:  Pharmacy may adjust dosing strength, interval, or rate of medication as needed for optimal therapy for the patient Send with patient on call to the OR.  Anesthesia to complete antibiotic administration <11min prior to incision per [facility].   2 g 100 mL/hr over 30 Minutes Intravenous On call to O.R. 04/02/14 1620 04/02/14 2104   04/02/14 2200  cefOXitin (MEFOXIN) 2 g in dextrose 5 % 50 mL IVPB  Status:  Discontinued    Comments:  Pharmacy may adjust dosing strength, interval, or rate of medication as needed for optimal therapy for the patient Send with patient on call to the OR.  Anesthesia to complete antibiotic administration <57min prior to incision per [facility].   2 g 100 mL/hr over 30 Minutes Intravenous  Once 04/02/14 2104 04/02/14 2144       Assessment/Plan  POD 1, s/p SBR through RLQ incision with primary hernia repair -cont NGT for today given small knuckle of ischemia and bowel resection.  May be able to get NGT out tomorrow -mobilize and pulm toilet -DC PCA and start IV pushes per patient request. HTN -hold BP meds due to low-normal BP for now  DVT proph -Heparin and SCDs Hyperkalemia -K removed from his fluid.  Recheck BMET in am  LOS: 1 day    Ibeth Fahmy E 04/03/2014, 12:03 PM Pager: [phone]

## 2014-04-04 LAB — BASIC METABOLIC PANEL
Anion gap: 7 (ref 5–15)
BUN: 11 mg/dL (ref 6–23)
CALCIUM: 8.9 mg/dL (ref 8.4–10.5)
CHLORIDE: 102 mmol/L (ref 96–112)
CO2: 32 mmol/L (ref 19–32)
Creatinine, Ser: 1.34 mg/dL (ref 0.50–1.35)
GFR calc Af Amer: 64 mL/min — ABNORMAL LOW (ref >90)
GFR, EST NON AFRICAN AMERICAN: 56 mL/min — AB (ref >90)
GLUCOSE: 120 mg/dL — AB (ref 70–99)
Potassium: 4.6 mmol/L (ref 3.5–5.1)
SODIUM: 141 mmol/L (ref 135–145)

## 2014-04-04 LAB — CBC
HEMATOCRIT: 43.3 % (ref 39.0–52.0)
HEMOGLOBIN: 14.5 g/dL (ref 13.0–17.0)
MCH: 30 pg (ref 26.0–34.0)
MCHC: 33.5 g/dL (ref 30.0–36.0)
MCV: 89.6 fL (ref 78.0–100.0)
Platelets: 152 10*3/uL (ref 150–400)
RBC: 4.83 MIL/uL (ref 4.22–5.81)
RDW: 13.8 % (ref 11.5–15.5)
WBC: 9 10*3/uL (ref 4.0–10.5)

## 2014-04-04 NOTE — Progress Notes (Signed)
2 Days Post-Op  Subjective: No complaints  Objective: Vital signs in last 24 hours: Temp:  [97.5 F (36.4 C)-99.2 F (37.3 C)] 99 F (37.2 C) (03/19 0510) Pulse Rate:  [49-72] 65 (03/19 0510) Resp:  [9-18] 18 (03/19 0510) BP: (116-141)/(73-84) 125/79 mmHg (03/19 0510) SpO2:  [96 %-100 %] 96 % (03/19 0510) Last BM Date: 04/02/14  Intake/Output from previous day: 03/18 0701 - 03/19 0700 In: 2599.2 [P.O.:120; I.V.:2479.2] Out: 4575 [Urine:2375; Emesis/NG output:2200] Intake/Output this shift:    Resp: clear to auscultation bilaterally Cardio: regular rate and rhythm GI: soft, mild tenderness. incision looks good. no bs yet  Lab Results:   Recent Labs  04/03/14 0428 04/04/14 0443  WBC 10.7* 9.0  HGB 15.1 14.5  HCT 42.5 43.3  PLT 173 152   BMET  Recent Labs  04/03/14 0428 04/04/14 0443  NA 135 141  K 5.2* 4.6  CL 101 102  CO2 27 32  GLUCOSE 153* 120*  BUN 11 11  CREATININE 1.12 1.34  CALCIUM 8.8 8.9   PT/INR  Recent Labs  04/02/14 1350  LABPROT 13.1  INR 0.98   ABG No results for input(Arias): PHART, HCO3 in the last 72 hours.  Invalid input(Arias): PCO2, PO2  Studies/Results: Ct Angio Chest Aorta W/cm &/or Wo/cm  04/02/2014   CLINICAL DATA:  Chest pain for 1 hour.  Diaphoresis.  EXAM: CT ANGIOGRAPHY CHEST AND ABDOMEN  TECHNIQUE: Multidetector CT imaging of the chest and abdomen was performed using the standard protocol during bolus administration of intravenous contrast. Multiplanar CT image reconstructions and MIPs were obtained to evaluate the vascular anatomy.  CONTRAST:  OMNIPAQUE IOHEXOL 350 MG/ML SOLN  COMPARISON:  None.  FINDINGS: CTA CHEST FINDINGS  No evidence of intramural hematoma, aortic dissection, or aortic aneurysm maximal transverse diameter of the ascending aorta is 3.5 cm. Some motion artifact in the arch is noted. Great vessels are widely patent within the confines of the examination. There are no filling defects in the pulmonary  arterial tree to suggest acute pulmonary thromboembolism.  No pneumothorax.  No pleural effusion.  No abnormal mediastinal adenopathy.  Trace LAD coronary artery calcification.  Clear lungs.  No acute bony deformity. Chronic right rib deformity. No vertebral compression deformity.  Review of the MIP images confirms the above findings.  CTA ABDOMEN FINDINGS  No evidence of aortic aneurysm or aortic dissection. Minimal atherosclerotic calcifications of the infrarenal aorta.  Celiac is patent. Branch vessels are patent. SMA is patent. Branch vessels are grossly patent.  Renal artery origins are patent. There is narrowing in the mid right renal artery approaching 50%. The mid left renal artery is lobulated with a beaded pattern. These findings are compatible with fibromuscular dysplasia.  IMA is diminutive and patent.  Bilateral common, internal, and external iliac arteries are patent.  Liver, spleen, pancreas, gallbladder, and adrenal glands are within normal limits.  Kidneys are unremarkable.  Sigmoid diverticulosis without definitive evidence of acute diverticulitis.  Bladder and prostate are unremarkable.  Normal appendix  Right inguinal hernia contains adipose tissue and small bowel loops. There is fluid in the hernia sac. There is also suspected to be wall edema in the loop of small bowel. A tiny loculated gas is associated with the small bowel loop and probably intraluminal gas. There is no evidence of bowel obstruction.  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  Review of the MIP images confirms the above findings.  IMPRESSION: No evidence of aortic dissection or intramural hematoma.  Fibromuscular dysplasia of the renal arteries. Correlate with the presence of uncontrolled hypertension.  Right inguinal hernia contains small bowel loops. There is no evidence of bowel obstruction. There is suspected to be wall edema. There is also fluid in the hernia sac. These findings raise the possibility of an  inflammatory process or vascular compromise of the small bowel loop.   Electronically Signed   By: Jolaine Click M.D.   On: 04/02/2014 14:50   Ct Angio Abd/pel W/ And/or W/o  04/02/2014   CLINICAL DATA:  Chest pain for 1 hour.  Diaphoresis.  EXAM: CT ANGIOGRAPHY CHEST AND ABDOMEN  TECHNIQUE: Multidetector CT imaging of the chest and abdomen was performed using the standard protocol during bolus administration of intravenous contrast. Multiplanar CT image reconstructions and MIPs were obtained to evaluate the vascular anatomy.  CONTRAST:  OMNIPAQUE IOHEXOL 350 MG/ML SOLN  COMPARISON:  None.  FINDINGS: CTA CHEST FINDINGS  No evidence of intramural hematoma, aortic dissection, or aortic aneurysm maximal transverse diameter of the ascending aorta is 3.5 cm. Some motion artifact in the arch is noted. Great vessels are widely patent within the confines of the examination. There are no filling defects in the pulmonary arterial tree to suggest acute pulmonary thromboembolism.  No pneumothorax.  No pleural effusion.  No abnormal mediastinal adenopathy.  Trace LAD coronary artery calcification.  Clear lungs.  No acute bony deformity. Chronic right rib deformity. No vertebral compression deformity.  Review of the MIP images confirms the above findings.  CTA ABDOMEN FINDINGS  No evidence of aortic aneurysm or aortic dissection. Minimal atherosclerotic calcifications of the infrarenal aorta.  Celiac is patent. Branch vessels are patent. SMA is patent. Branch vessels are grossly patent.  Renal artery origins are patent. There is narrowing in the mid right renal artery approaching 50%. The mid left renal artery is lobulated with a beaded pattern. These findings are compatible with fibromuscular dysplasia.  IMA is diminutive and patent.  Bilateral common, internal, and external iliac arteries are patent.  Liver, spleen, pancreas, gallbladder, and adrenal glands are within normal limits.  Kidneys are unremarkable.  Sigmoid  diverticulosis without definitive evidence of acute diverticulitis.  Bladder and prostate are unremarkable.  Normal appendix  Right inguinal hernia contains adipose tissue and small bowel loops. There is fluid in the hernia sac. There is also suspected to be wall edema in the loop of small bowel. A tiny loculated gas is associated with the small bowel loop and probably intraluminal gas. There is no evidence of bowel obstruction.  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  Review of the MIP images confirms the above findings.  IMPRESSION: No evidence of aortic dissection or intramural hematoma.  Fibromuscular dysplasia of the renal arteries. Correlate with the presence of uncontrolled hypertension.  Right inguinal hernia contains small bowel loops. There is no evidence of bowel obstruction. There is suspected to be wall edema. There is also fluid in the hernia sac. These findings raise the possibility of an inflammatory process or vascular compromise of the small bowel loop.   Electronically Signed   By: Jolaine Click M.D.   On: 04/02/2014 14:50    Anti-infectives: Anti-infectives    Start     Dose/Rate Route Frequency Ordered Stop   04/03/14 0600  cefOXitin (MEFOXIN) 2 g in dextrose 5 % 50 mL IVPB  Status:  Discontinued    Comments:  Pharmacy may adjust dosing strength, interval, or rate of medication as needed for optimal therapy for the  patient Send with patient on call to the OR.  Anesthesia to complete antibiotic administration <6360min prior to [facility].   2 g 100 mL/hr over 30 Minutes Intravenous On call to O.R. 04/02/14 1620 04/02/14 2104   04/02/14 2200  cefOXitin (MEFOXIN) 2 g in dextrose 5 % 50 mL IVPB  Status:  Discontinued    Comments:  Pharmacy may adjust dosing strength, interval, or rate of medication as needed for optimal therapy for the patient Send with patient on call to the OR.  Anesthesia to complete antibiotic administration <2560min prior to incision  [facility].   2 g 100 mL/hr over 30 Minutes Intravenous  Once 04/02/14 2104 04/02/14 2144      Assessment/Plan: Arias/p Procedure(Arias): HERNIA REPAIR INGUINAL INCARCERATED  (Right) SMALL BOWEL RESECTION (N/A) Continue bowel rest and ng  ambulate  LOS: 2 days    Alex Arias,Alex Arias 04/04/2014

## 2014-04-05 LAB — URINALYSIS, ROUTINE W REFLEX MICROSCOPIC
BILIRUBIN URINE: NEGATIVE
GLUCOSE, UA: NEGATIVE mg/dL
Hgb urine dipstick: NEGATIVE
Ketones, ur: 15 mg/dL — AB
LEUKOCYTES UA: NEGATIVE
Nitrite: NEGATIVE
PH: 7 (ref 5.0–8.0)
Protein, ur: NEGATIVE mg/dL
SPECIFIC GRAVITY, URINE: 1.015 (ref 1.005–1.030)
Urobilinogen, UA: 0.2 mg/dL (ref 0.0–1.0)

## 2014-04-05 NOTE — Progress Notes (Signed)
3 Days Post-Op  Subjective: No complaints. He states he has passed flatus several times this am  Objective: Vital signs in last 24 hours: Temp:  [98.9 F (37.2 C)-99.6 F (37.6 C)] 98.9 F (37.2 C) (03/20 0553) Pulse Rate:  [74-75] 75 (03/20 0553) Resp:  [16-18] 16 (03/20 0553) BP: (125-145)/(74-90) 145/90 mmHg (03/20 0553) SpO2:  [97 %-98 %] 97 % (03/20 0553) Last BM Date: 04/02/14  Intake/Output from previous day: 03/19 0701 - 03/20 0700 In: 3077.1 [I.V.:3077.1] Out: 1250 [Urine:200; Emesis/NG output:1050] Intake/Output this shift:    Resp: clear to auscultation bilaterally Cardio: regular rate and rhythm GI: soft, nontender. good bs. incision ok  Lab Results:   Recent Labs  04/03/14 0428 04/04/14 0443  WBC 10.7* 9.0  HGB 15.1 14.5  HCT 42.5 43.3  PLT 173 152   BMET  Recent Labs  04/03/14 0428 04/04/14 0443  NA 135 141  K 5.2* 4.6  CL 101 102  CO2 27 32  GLUCOSE 153* 120*  BUN 11 11  CREATININE 1.12 1.34  CALCIUM 8.8 8.9   PT/INR  Recent Labs  04/02/14 1350  LABPROT 13.1  INR 0.98   ABG No results for input(s): PHART, HCO3 in the last 72 hours.  Invalid input(s): PCO2, PO2  Studies/Results: No results found.  Anti-infectives: Anti-infectives    Start     Dose/Rate Route Frequency Ordered Stop   04/03/14 0600  cefOXitin (MEFOXIN) 2 g in dextrose 5 % 50 mL IVPB  Status:  Discontinued    Comments:  Pharmacy may adjust dosing strength, interval, or rate of medication as needed for optimal therapy for the patient Send with patient on call to the OR.  Anesthesia to complete antibiotic administration <2860min prior [facility].   2 g 100 mL/hr over 30 Minutes Intravenous On call to O.R. 04/02/14 1620 04/02/14 2104   04/02/14 2200  cefOXitin (MEFOXIN) 2 g in dextrose 5 % 50 mL IVPB  Status:  Discontinued    Comments:  Pharmacy may adjust dosing strength, interval, or rate of medication as needed for optimal therapy for the  patient Send with patient on call to the OR.  Anesthesia to complete antibiotic administration <6060min prior to incision [facility].   2 g 100 mL/hr over 30 Minutes Intravenous  Once 04/02/14 2104 04/02/14 2144      Assessment/Plan: s/p Procedure(s): HERNIA REPAIR INGUINAL INCARCERATED  (Right) SMALL BOWEL RESECTION (N/A) Discontinue ng and allow sips of clears  ambulate  LOS: 3 days    TOTH III,PAUL S 04/05/2014

## 2014-04-05 NOTE — Progress Notes (Signed)
NG tube removed without signs of trauma, pt tolerated well.  No complaints at this time.  Will continue to monitor. Sherald BargeSpencer, Ashyah Quizon T

## 2014-04-06 ENCOUNTER — Encounter (HOSPITAL_COMMUNITY): Payer: Self-pay | Admitting: General Surgery

## 2014-04-06 DIAGNOSIS — K559 Vascular disorder of intestine, unspecified: Secondary | ICD-10-CM | POA: Diagnosis present

## 2014-04-06 MED ORDER — TRAMADOL HCL 50 MG PO TABS: 50.0000 mg | ORAL_TABLET | Freq: Four times a day (QID) | ORAL | Status: DC | PRN

## 2014-04-06 MED ORDER — HYDROCHLOROTHIAZIDE 12.5 MG PO CAPS
12.5000 mg | ORAL_CAPSULE | Freq: Every day | ORAL | Status: DC
  Administered 2014-04-06: 12.5 mg via ORAL
  Filled 2014-04-06: qty 1

## 2014-04-06 MED ORDER — DOCUSATE SODIUM 100 MG PO CAPS: 100.0000 mg | ORAL_CAPSULE | Freq: Two times a day (BID) | ORAL | Status: DC | PRN

## 2014-04-06 MED ORDER — LISINOPRIL-HYDROCHLOROTHIAZIDE 20-12.5 MG PO TABS: 1.0000 | ORAL_TABLET | Freq: Every day | ORAL | Status: DC

## 2014-04-06 MED ORDER — DOCUSATE SODIUM 100 MG PO CAPS
100.0000 mg | ORAL_CAPSULE | Freq: Two times a day (BID) | ORAL | Status: DC
  Administered 2014-04-06: 100 mg via ORAL
  Filled 2014-04-06: qty 1

## 2014-04-06 MED ORDER — LISINOPRIL 20 MG PO TABS
20.0000 mg | ORAL_TABLET | Freq: Every day | ORAL | Status: DC
  Administered 2014-04-06: 20 mg via ORAL
  Filled 2014-04-06: qty 2

## 2014-04-06 NOTE — Progress Notes (Signed)
[facility] Progress Note  4 Days Post-Op  Subjective: Pt doing well, minimal pain in right groin incision.  Does note scrotal swelling.  Tolerating diet well, having good flatus, no BM yet.  Ambulating well, not asking for pain medication.  Objective: Vital signs in last 24 hours: Temp:  [97.9 F (36.6 C)-98.7 F (37.1 C)] 98.7 F (37.1 C) (03/21 0540) Pulse Rate:  [60-68] 68 (03/21 0540) Resp:  [16-18] 18 (03/21 0540) BP: (135-140)/(79-87) 135/87 mmHg (03/21 0540) SpO2:  [97 %-99 %] 97 % (03/21 0540) Last BM Date: 04/02/14  Intake/Output from previous day: 03/20 0701 - 03/21 0700 In: 360 [P.O.:360] Out: 1100 [Urine:1100] Intake/Output this shift:    PE: Gen:  Alert, NAD, pleasant Card:  RRR, no M/G/R heard Pulm:  CTA, no W/R/R Abd: Soft, minimal tenderness, +BS, no HSM, incisions C/D/I in right groin, dermabond in place   Lab Results:   Recent Labs  04/04/14 0443  WBC 9.0  HGB 14.5  HCT 43.3  PLT 152   BMET  Recent Labs  04/04/14 0443  NA 141  K 4.6  CL 102  CO2 32  GLUCOSE 120*  BUN 11  CREATININE 1.34  CALCIUM 8.9   PT/INR No results for input(s): LABPROT, INR in the last 72 hours. CMP     Component Value Date/Time   NA 141 04/04/2014 0443   K 4.6 04/04/2014 0443   CL 102 04/04/2014 0443   CO2 32 04/04/2014 0443   GLUCOSE 120* 04/04/2014 0443   BUN 11 04/04/2014 0443   CREATININE 1.34 04/04/2014 0443   CALCIUM 8.9 04/04/2014 0443   PROT 6.8 04/02/2014 1350   ALBUMIN 4.1 04/02/2014 1350   AST 31 04/02/2014 1350   ALT 31 04/02/2014 1350   ALKPHOS 55 04/02/2014 1350   BILITOT 0.9 04/02/2014 1350   GFRNONAA 56* 04/04/2014 0443   GFRAA 64* 04/04/2014 0443   Lipase     Component Value Date/Time   LIPASE 26 04/02/2014 1350       Studies/Results: No results found.  Anti-infectives: Anti-infectives    Start     Dose/Rate Route Frequency Ordered Stop   04/03/14 0600  cefOXitin (MEFOXIN) 2 g in dextrose 5 % 50 mL IVPB   Status:  Discontinued    Comments:  Pharmacy may adjust dosing strength, interval, or rate of medication as needed for optimal therapy for the patient Send with patient on call to the OR.  Anesthesia to complete antibiotic administration <7660min prior to incision [facility].   2 g 100 mL/hr over 30 Minutes Intravenous On call to O.R. 04/02/14 1620 04/02/14 2104   04/02/14 2200  cefOXitin (MEFOXIN) 2 g in dextrose 5 % 50 mL IVPB  Status:  Discontinued    Comments:  Pharmacy may adjust dosing strength, interval, or rate of medication as needed for optimal therapy for the patient Send with patient on call to the OR.  Anesthesia to complete antibiotic administration <3260min prior to incision [facility].   2 g 100 mL/hr over 30 Minutes Intravenous  Once 04/02/14 2104 04/02/14 2144       Assessment/Plan Incarcerated RIH POD #4, s/p SBR through RLQ incision with primary hernia repair -NG tube out, tolerating clears, advance to full liquids at breakfast, soft at lunch -Mobilize, pulm toilet, and IS -Encourage orals for pain -Red. IVF HTN -resume today DVT proph -Heparin and SCDs VTE Proph  -SCD's and heparin Disp:  -possibly today if BM    LOS:  4 days    Alex Arias 04/06/2014, 7:55 AM Pager: [phone]

## 2014-04-06 NOTE — Discharge Summary (Signed)
[facility] Discharge Summary   Patient ID: Alex Arias MRN: [id number] DOB/AGE: 61-Aug-1957 61 y.o.  Admit date: 04/02/2014 Discharge date: 04/06/2014  Admitting Diagnosis: Incarcerated right inguinal hernia with obstruction  Discharge Diagnosis Patient Active Problem List   Diagnosis Date Noted  . 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 Incarcerated right inguinal hernia 04/02/2014  . Recurrent unilateral inguinal hernia with obstruction and without gangrene 04/02/2014  . Bilateral inguinal hernia 01/23/2011    Consultants None  Imaging: No results found.  Procedures Dr. Janee Morn - (04/02/14) - Repair of incarcerated recurrent right inguinal hernia, Small bowel resection  Hospital Course:  59 year old male with a history of bilateral inguinal hernia repair in 2013 and hypertension presenting with periumbilical and chest pains. Onset was sudden. Coarse is unchanged. Severe is severity. Sharp, constant pain. No aggravating or alleviating factors. No modifying factors. He reports having a hernia for 6 months, but has never been bothersome until this morning. Associated with nausea, vomiting and sweating. He has been NPO since 9AM. He had a loose BM this AM. Denies previous obstructive symptoms. He was seen by Dr. Derrell Lolling on Monday and being scheduled for a repair. He is otherwise healthy. He denies DOE or chest pains prior to today. He had a CTA of chest and abdomen which was negative for an aneurysm, but significant for incarcerated right inguinal hernia. WBC 11.8 and lactic acid of 3.46.   Patient was admitted and underwent procedure listed above.  Tolerated procedure well and was transferred to the floor.  He experienced a post-operative ileus which resolved over the next few days.  Bowel function resumed.  NG was discontinued and diet was advanced as tolerated.  Patient started on stool softeners.  Patient is expected to have a BM in the next few days, but  having great flatus and tolerating diet well.  On POD #4, the patient was voiding well, tolerating diet, ambulating well, pain well controlled, vital signs stable, incisions c/d/i and felt stable for discharge home.  Patient will follow up in our office in 2 weeks and knows to call with questions or concerns.      Medication List    TAKE these medications        aspirin 325 MG EC tablet  Take 325 mg by mouth every 6 (six) hours as needed for pain.     bismuth subsalicylate 262 MG/15ML suspension  Commonly known as:  PEPTO BISMOL  Take 15 mLs by mouth every 6 (six) hours as needed. For stomach upset     docusate sodium 100 MG capsule  Commonly known as:  COLACE  Take 1 capsule (100 mg total) by mouth 2 (two) times daily as needed for mild constipation.     lisinopril-hydrochlorothiazide 20-12.5 MG per tablet  Commonly known as:  PRINZIDE,ZESTORETIC  Take 1 tablet by mouth daily.     multivitamin with minerals Tabs tablet  Take 1 tablet by mouth daily.     traMADol 50 MG tablet  Commonly known as:  ULTRAM  Take 1-2 tablets (50-100 mg total) by mouth every 6 (six) hours as needed for moderate pain or severe pain.         Follow-up Information    Follow up with [facility], MD. Schedule an appointment as soon as possible for a visit in 3 weeks.   Specialty:  General Surgery   Why:  For post-operation check   Contact information:   [address] [place] [address]  [phone]       Signed: Aris GeorgiaMegan Dort, [facility] [phone]  04/06/2014, 2:21 PM

## 2014-04-06 NOTE — Discharge Instructions (Signed)
[facility] [phone]  OPEN ABDOMINAL SURGERY: POST OP INSTRUCTIONS  Always review your discharge instruction sheet given to you by the facility where your surgery was performed.  IF YOU HAVE DISABILITY OR FAMILY LEAVE FORMS, YOU MUST BRING THEM TO THE OFFICE FOR PROCESSING.  PLEASE DO NOT GIVE THEM TO YOUR DOCTOR.  1. A prescription for pain medication may be given to you upon discharge.  Take your pain medication as prescribed, if needed.  If narcotic pain medicine is not needed, then you may take acetaminophen (Tylenol) or ibuprofen (Advil) as needed. 2. Take your usually prescribed medications unless otherwise directed. 3. If you need a refill on your pain medication, please contact your pharmacy. They will contact our office to request authorization.  Prescriptions will not be filled after 5pm or on week-ends. 4. You should follow a light diet the first few days after arrival home, such as soup and crackers, pudding, etc.unless your doctor has advised otherwise. A high-fiber, low fat diet can be resumed as tolerated.   Be sure to include lots of fluids daily. Most patients will experience some swelling and bruising on the chest and neck area.  Ice packs will help.  Swelling and bruising can take several days to resolve 5. Most patients will experience some swelling and bruising in the area of the incision. Ice pack will help. Swelling and bruising can take several days to resolve..  6. It is common to experience some constipation if taking pain medication after surgery.  Increasing fluid intake and taking a stool softener will usually help or prevent this problem from occurring.  A mild laxative (Milk of Magnesia or Miralax) should be taken according to package directions if there are no bowel movements after 48 hours. 7.  You may have steri-strips (small skin tapes) in place directly over the incision.  These strips should be left on the skin for 7-10 days.  If your  surgeon used skin glue on the incision, you may shower in 24 hours.  The glue will flake off over the next 2-3 weeks.  Any sutures or staples will be removed at the office during your follow-up visit. You may find that a light gauze bandage over your incision may keep your staples from being rubbed or pulled. You may shower and replace the bandage daily. 8. ACTIVITIES:  You may resume regular (light) daily activities beginning the next day--such as daily self-care, walking, climbing stairs--gradually increasing activities as tolerated.  You may have sexual intercourse when it is comfortable.  Refrain from any heavy lifting or straining until approved by your doctor. a. You may drive when you no longer are taking prescription pain medication, you can comfortably wear a seatbelt, and you can safely maneuver your car and apply brakes b. Return to Work: ___________________________________ 9. You should see your doctor in the office for a follow-up appointment approximately two weeks after your surgery.  Make sure that you call for this appointment within a day or two after you arrive home to insure a convenient appointment time. OTHER INSTRUCTIONS:  _____________________________________________________________ _____________________________________________________________  WHEN TO CALL YOUR DOCTOR: 1. Fever over 101.0 2. Inability to urinate 3. Nausea and/or vomiting 4. Extreme swelling or bruising 5. Continued bleeding from incision. 6. Increased pain, redness, or drainage from the incision. 7. Difficulty swallowing or breathing 8. Muscle cramping or spasms. 9. Numbness or tingling in hands or feet or around lips.  The clinic staff is available to   answer your questions during regular business hours.  Please don't hesitate to call and ask to speak to one of the nurses if you have concerns.  For further questions, please visit [email or web address]   

## 2014-04-06 NOTE — Progress Notes (Signed)
Alex Arias to be D/C'd Home per MD order.  Discussed with the patient and all questions fully answered.  VSS. Surgical site clean, dry, intact.  IV catheter discontinued intact. Site without signs and symptoms of complications. Dressing and pressure applied.  An After Visit Summary was printed and given to the patient. Patient received prescription.  D/c education completed with patient/family including follow up instructions, medication list, d/c activities limitations if indicated, with other d/c instructions as indicated by MD - patient able to verbalize understanding, all questions fully answered.   Patient instructed to return to ED, call 911, or call MD for any changes in condition.   Patient escorted via WC, and D/C home via private auto.  Burt EkCook, Jovann Luse D 04/06/2014 3:07 PM

## 2014-04-08 ENCOUNTER — Emergency Department (HOSPITAL_COMMUNITY)
Admission: EM | Admit: 2014-04-08 | Discharge: 2014-04-08 | Disposition: A | Discharge status: Home / Self Care | Payer: BLUE CROSS/BLUE SHIELD | Attending: Emergency Medicine | Admitting: Emergency Medicine

## 2014-04-08 ENCOUNTER — Emergency Department (HOSPITAL_COMMUNITY): Payer: BLUE CROSS/BLUE SHIELD

## 2014-04-08 ENCOUNTER — Encounter (HOSPITAL_COMMUNITY): Payer: Self-pay | Admitting: Emergency Medicine

## 2014-04-08 DIAGNOSIS — R112 Nausea with vomiting, unspecified: Secondary | ICD-10-CM

## 2014-04-08 DIAGNOSIS — Z7982 Long term (current) use of aspirin: Secondary | ICD-10-CM | POA: Insufficient documentation

## 2014-04-08 DIAGNOSIS — Z79899 Other long term (current) drug therapy: Secondary | ICD-10-CM | POA: Diagnosis not present

## 2014-04-08 DIAGNOSIS — Z872 Personal history of diseases of the skin and subcutaneous tissue: Secondary | ICD-10-CM | POA: Insufficient documentation

## 2014-04-08 DIAGNOSIS — Z8619 Personal history of other infectious and parasitic diseases: Secondary | ICD-10-CM | POA: Diagnosis not present

## 2014-04-08 DIAGNOSIS — Z8719 Personal history of other diseases of the digestive system: Secondary | ICD-10-CM | POA: Diagnosis not present

## 2014-04-08 DIAGNOSIS — R109 Unspecified abdominal pain: Secondary | ICD-10-CM | POA: Diagnosis not present

## 2014-04-08 DIAGNOSIS — R1110 Vomiting, unspecified: Secondary | ICD-10-CM | POA: Diagnosis present

## 2014-04-08 LAB — CBC WITH DIFFERENTIAL/PLATELET
Basophils Absolute: 0 10*3/uL (ref 0.0–0.1)
Basophils Relative: 0 % (ref 0–1)
Eosinophils Absolute: 0.1 10*3/uL (ref 0.0–0.7)
Eosinophils Relative: 1 % (ref 0–5)
HCT: 42.9 % (ref 39.0–52.0)
Hemoglobin: 15.4 g/dL (ref 13.0–17.0)
LYMPHS ABS: 0.7 10*3/uL (ref 0.7–4.0)
LYMPHS PCT: 8 % — AB (ref 12–46)
MCH: 31 pg (ref 26.0–34.0)
MCHC: 35.9 g/dL (ref 30.0–36.0)
MCV: 86.3 fL (ref 78.0–100.0)
Monocytes Absolute: 0.5 10*3/uL (ref 0.1–1.0)
Monocytes Relative: 6 % (ref 3–12)
Neutro Abs: 7.6 10*3/uL (ref 1.7–7.7)
Neutrophils Relative %: 85 % — ABNORMAL HIGH (ref 43–77)
PLATELETS: 174 10*3/uL (ref 150–400)
RBC: 4.97 MIL/uL (ref 4.22–5.81)
RDW: 13.2 % (ref 11.5–15.5)
WBC: 9 10*3/uL (ref 4.0–10.5)

## 2014-04-08 LAB — COMPREHENSIVE METABOLIC PANEL
ALK PHOS: 44 U/L (ref 39–117)
ALT: 40 U/L (ref 0–53)
ANION GAP: 7 (ref 5–15)
AST: 50 U/L — ABNORMAL HIGH (ref 0–37)
Albumin: 3.9 g/dL (ref 3.5–5.2)
BUN: 12 mg/dL (ref 6–23)
CALCIUM: 9.2 mg/dL (ref 8.4–10.5)
CO2: 29 mmol/L (ref 19–32)
Chloride: 104 mmol/L (ref 96–112)
Creatinine, Ser: 1.13 mg/dL (ref 0.50–1.35)
GFR calc Af Amer: 79 mL/min — ABNORMAL LOW (ref >90)
GFR calc non Af Amer: 68 mL/min — ABNORMAL LOW (ref >90)
Glucose, Bld: 100 mg/dL — ABNORMAL HIGH (ref 70–99)
POTASSIUM: 4.5 mmol/L (ref 3.5–5.1)
SODIUM: 140 mmol/L (ref 135–145)
TOTAL PROTEIN: 7.3 g/dL (ref 6.0–8.3)
Total Bilirubin: 1.5 mg/dL — ABNORMAL HIGH (ref 0.3–1.2)

## 2014-04-08 LAB — LIPASE, BLOOD: Lipase: 24 U/L (ref 11–59)

## 2014-04-08 MED ORDER — ONDANSETRON HCL 4 MG/2ML IJ SOLN
4.0000 mg | Freq: Once | INTRAMUSCULAR | Status: AC
  Administered 2014-04-08: 4 mg via INTRAVENOUS
  Filled 2014-04-08: qty 2

## 2014-04-08 MED ORDER — ONDANSETRON HCL 4 MG PO TABS: 4.0000 mg | ORAL_TABLET | Freq: Four times a day (QID) | ORAL | Status: DC

## 2014-04-08 NOTE — Discharge Instructions (Signed)
Small Bowel Series   A small bowel series is a medical test with X-rays that checks for problems in the small bowel. The small bowel is also called the small intestine. It connects the stomach to the large intestine (colon). A small bowel series can help your caregiver see blockages and abnormal growths. It can also help to solve digestion problems, such as abdominal pain, bloating, or diarrhea. Another name for a small bowel series is small bowel follow through.  LET YOUR CAREGIVER KNOW ABOUT:  · Allergies to food or medicine.  · History of diabetes.  · Medicines taken, including vitamins, herbs, eyedrops, over-the-counter medicines, and creams.  · Previous problems with barium.  · Any constipation problems.  · Breastfeeding, if this applies.  · Possibility of pregnancy, if this applies.  RISKS AND COMPLICATIONS  Problems do not occur often with this procedure, but they are possible. Possible problems include:  · An allergic reaction to barium (rare).  · Problems from the very small amount of radiation exposure.  · Worsening of a blockage in your small bowel.  · Cramps or constipation.  BEFORE THE PROCEDURE  · Several days before the exam, you may need to change what you eat. Follow your caregiver's directions.  · The night before the exam, do not eat or drink anything after midnight.  · Ask your caregiver if it is okay to take any needed medicines with a sip of water.  · If you have diabetes and need to take insulin, ask for instructions on how to do this before the exam.  PROCEDURE   A small bowel series is done while you are awake. You will not need pain medicine. The exam usually takes 2 to 4 hours. The length of the exam depends on how long it takes for the barium to move through your system. It also depends on what is found during the exam.   · You will be given an intravenous (IV) tube. A needle will be put in your arm or hand. Medicine will flow directly into your body through the IV tube. The medicine will  relax the small bowel. This slows down bowel movements and helps make better X-ray pictures.  · A regular X-ray will be taken of your abdomen. This is called a scout film or before barium film. This is done to make sure you are ready for the exam.  · You will drink about 2 cups of barium. This is like a milkshake that tastes like chalk. It might make you feel bloated. Barium helps the inside of the small bowel to show up clearly on the screen. You may need to drink a bit more barium later in the exam.  · A type of X-ray called fluoroscopy will be used. X-ray pictures are taken every 20 to 30 minutes. You may need to stand up or lie on a table. The table may move or tilt. You may need to turn from side to side. This is done to get pictures from different angles. Your caregiver might also press on your abdomen.  · The exam is done when all the needed pictures have been taken.  AFTER THE PROCEDURE  · You may go back to your normal diet and activities right away.  · Ask when your test results will be ready. Follow up with your caregiver to discuss your test results.  Document Released: 12/22/2010 Document Revised: 03/27/2011 Document Reviewed: 12/22/2010  ExitCare® Patient Information ©2015 [employer]. This information is not intended   to replace advice given to you by your health care provider. Make sure you discuss any questions you have with your health care provider.

## 2014-04-08 NOTE — Progress Notes (Signed)
Patient ID: Alex Arias, male   DOB: Jul 29, 1955, 59 y.o.   MRN: [id number]    Subjective: Pt came back to the [facility] today after having an episode of emesis last night after drinking a drink with miralax in it.  He did "chug" this pretty fast.  He has not really had any other nausea or vomiting today.  He called our office and I believe was initially directed to our office, but eventually directed to the ED.  While here, he has been found to have normal labs and a normal plain view x-ray.  He currently denies any pain or other symptoms.  He has moved his bowel.  Objective: Vital signs in last 24 hours: Temp:  [97.8 F (36.6 C)] 97.8 F (36.6 C) (03/23 1130) Pulse Rate:  [53-67] 67 (03/23 1600) Resp:  [16] 16 (03/23 1600) BP: (115-128)/(72-84) 128/74 mmHg (03/23 1600) SpO2:  [97 %-100 %] 100 % (03/23 1600)    Intake/Output from previous day:   Intake/Output this shift:    PE: Abd: soft, Nt, ND, right inguinal incision is c/d/i with dermabond still present Heart: regular Lungs: CTAB  Lab Results:   Recent Labs  04/08/14 1242  WBC 9.0  HGB 15.4  HCT 42.9  PLT 174   BMET  Recent Labs  04/08/14 1242  NA 140  K 4.5  CL 104  CO2 29  GLUCOSE 100*  BUN 12  CREATININE 1.13  CALCIUM 9.2   PT/INR No results for input(s): LABPROT, INR in the last 72 hours. CMP     Component Value Date/Time   NA 140 04/08/2014 1242   K 4.5 04/08/2014 1242   CL 104 04/08/2014 1242   CO2 29 04/08/2014 1242   GLUCOSE 100* 04/08/2014 1242   BUN 12 04/08/2014 1242   CREATININE 1.13 04/08/2014 1242   CALCIUM 9.2 04/08/2014 1242   PROT 7.3 04/08/2014 1242   ALBUMIN 3.9 04/08/2014 1242   AST 50* 04/08/2014 1242   ALT 40 04/08/2014 1242   ALKPHOS 44 04/08/2014 1242   BILITOT 1.5* 04/08/2014 1242   GFRNONAA 68* 04/08/2014 1242   GFRAA 79* 04/08/2014 1242   Lipase     Component Value Date/Time   LIPASE 24 04/08/2014 1242       Studies/Results: Dg Abd 2 Views  04/08/2014    CLINICAL DATA:  One day history of nausea and diarrhea  EXAM: ABDOMEN - 2 VIEW  COMPARISON:  April 02, 2014  FINDINGS: Supine and upright images were obtained. There are surgical clips in the pelvis. There is moderate stool in colon. There is no bowel dilatation or air-fluid level suggesting obstruction. No free air. There are phleboliths in the pelvis. Lung bases are clear.  IMPRESSION: Overall bowel gas pattern unremarkable.  Surgical clips in pelvis.   Electronically Signed   By: Bretta BangWilliam  Woodruff III M.D.   On: 04/08/2014 13:38    Anti-infectives: Anti-infectives    None       Assessment/Plan  1. POD 6, s/p right inguinal hernia repair with SBR for ischemia bowel 2. Nausea, vomiting x1 episode  Plan: 1. The patient has been fine today with no other symptoms.  His plain film is normal.  He is stable from our standpoint for dc home with some zofran prn.  he should follow up with Dr. Janee Mornhompson in our office as scheduled.  I discussed all of this with the patient and his wife.  All questions were answered.      Deashia Soule E 04/08/2014,  4:16 PM Pager: [phone]

## 2014-04-08 NOTE — ED Provider Notes (Signed)
CSN: [id number]     Arrival date & time 04/08/14  1120 History   First MD Initiated Contact with Patient 04/08/14 1206     Chief Complaint  Patient presents with  . Emesis     (Consider location/radiation/quality/duration/timing/severity/associated sxs/prior Treatment) HPI    PCP: BURNETT,BRENT A, MD Blood pressure 122/72, pulse 57, temperature 97.8 F (36.6 C), temperature source Oral, resp. rate 16, SpO2 100 %.  Alex LundRalph Mccurdy is a 59 y.o.male with a significant PMH of recent laparoscopic inguinal herniation requiring partial bowel resection  presents to the ER with complaints of vomiting x 1 episode and some nausea. The wife called the on-call physician who requested that he be seen today in the office. While driving to the appointment Dr. [facility]ornhompson and [facility] who recommended he come to the ER. The wife is present and is very anxious about why he is in the ER. She reports feeding him "light/vegan" food. He has had no belly pains. I spoke with Dr. Janee Mornhompson and [facility] and he recommends getting blood work and abdominal films. He has already alerted Dr. Derrell Lollingamirez that the patient is here and will be expecting the consult and most likely admission. Patient has been passing flatus and hearing bowel sounds.  Negative Review of Symptoms: denies LE swelling, CP, SOB, diarrhea, confusion, weakness, fevers, drainage from the surgical site.   Past Medical History  Diagnosis Date  . Right inguinal hernia   . Abdominal pain   . Abdominal distension   . Cough   . Itching   . Pleurisy   . Unspecified viral infection, in conditions classified elsewhere and of unspecified site     viral syndrome - per notes from [facility] dated 01/03/11  . Family history of diabetes mellitus    Past Surgical History  Procedure Laterality Date  . Wisdom tooth extraction    . Inguinal hernia repair  02/01/2011    Procedure: LAPAROSCOPIC INGUINAL HERNIA;  Surgeon: Almond LintFaera Byerly, MD;  Location: [facility] OR;   Service: General;  Laterality: Bilateral;  Laparoscopic inguinal repair with mesh - Bilateral  . Inguinal hernia repair Right 04/02/2014    Procedure: HERNIA REPAIR INGUINAL INCARCERATED ;  Surgeon: Violeta GelinasBurke Thompson, MD;  [facility];  Service: General;  Laterality: Right;  . Bowel resection N/A 04/02/2014    Procedure: SMALL BOWEL RESECTION;  Surgeon: Violeta GelinasBurke Thompson, MD;  [facility];  Service: General;  Laterality: N/A;   No family history on file. History  Substance Use Topics  . Smoking status: Never Smoker   . Smokeless tobacco: Never Used  . Alcohol Use: Yes     Comment: 1 - 3 wine or beer weekly    Review of Systems  10 Systems reviewed and are negative for acute change except as noted in the HPI.    Allergies  Review of patient's allergies indicates no known allergies.  Home Medications   Prior to Admission medications   Medication Sig Start Date End Date Taking? Authorizing Provider  aspirin 325 MG EC tablet Take 325 mg by mouth every 6 (six) hours as needed for pain.   Yes Historical Provider, MD  bismuth subsalicylate (PEPTO BISMOL) 262 MG/15ML suspension Take 15 mLs by mouth every 6 (six) hours as needed. For stomach upset    Yes Historical Provider, MD  docusate sodium (COLACE) 100 MG capsule Take 1 capsule (100 mg total) by mouth 2 (two) times daily as needed for mild constipation. 04/06/14  Yes Megan N Dort, PA-C  lisinopril-hydrochlorothiazide (  PRINZIDE,ZESTORETIC) 20-12.5 MG per tablet Take 1 tablet by mouth daily.   Yes Historical Provider, MD  Multiple Vitamin (MULITIVITAMIN WITH MINERALS) TABS Take 1 tablet by mouth daily.     Yes Historical Provider, MD  traMADol (ULTRAM) 50 MG tablet Take 1-2 tablets (50-100 mg total) by mouth every 6 (six) hours as needed for moderate pain or severe pain. 04/06/14  Yes Megan N Dort, PA-C  ondansetron (ZOFRAN) 4 MG tablet Take 1 tablet (4 mg total) by mouth every 6 (six) hours. 04/08/14   Quince Santana Neva Seat, PA-C   BP 115/77 mmHg   Pulse 55  Temp(Src) 97.8 F (36.6 C) (Oral)  Resp 16  SpO2 100% Physical Exam  Constitutional: He appears well-developed and well-nourished. No distress.  HENT:  Head: Normocephalic and atraumatic.  Eyes: Pupils are equal, round, and reactive to light.  Neck: Normal range of motion. Neck supple.  Cardiovascular: Normal rate and regular rhythm.   Pulmonary/Chest: Effort normal and breath sounds normal. No accessory muscle usage. No respiratory distress. He has no decreased breath sounds. He has no wheezes.  Abdominal: Soft. Bowel sounds are normal. He exhibits no distension.    Neurological: He is alert.  Skin: Skin is warm and dry.  Nursing note and vitals reviewed.   ED Course  Procedures (including critical care time) Labs Review Labs Reviewed  CBC WITH DIFFERENTIAL/PLATELET - Abnormal; Notable for the following:    Neutrophils Relative % 85 (*)    Lymphocytes Relative 8 (*)    All other components within normal limits  COMPREHENSIVE METABOLIC PANEL - Abnormal; Notable for the following:    Glucose, Bld 100 (*)    AST 50 (*)    Total Bilirubin 1.5 (*)    GFR calc non Af Amer 68 (*)    GFR calc Af Amer 79 (*)    All other components within normal limits  LIPASE, BLOOD    Imaging Review Dg Abd 2 Views  04/08/2014   CLINICAL DATA:  One day history of nausea and diarrhea  EXAM: ABDOMEN - 2 VIEW  COMPARISON:  April 02, 2014  FINDINGS: Supine and upright images were obtained. There are surgical clips in the pelvis. There is moderate stool in colon. There is no bowel dilatation or air-fluid level suggesting obstruction. No free air. There are phleboliths in the pelvis. Lung bases are clear.  IMPRESSION: Overall bowel gas pattern unremarkable.  Surgical clips in pelvis.   Electronically Signed   By: Bretta Bang III M.D.   On: 04/08/2014 13:38     EKG Interpretation None      MDM   Final diagnoses:  Abdominal pain  Non-intractable vomiting with nausea, vomiting  of unspecified type    Patient with no pain. Had 1 episode of nausea and vomiting last night. Since then no more symptoms. He has a normal abdominal xray and labs I spoke with GenSurg who feels like he will be able to go home with some Zofran since he is no longer vomiting Not having pain, and normal labs/images. The patients wife wants surgery to see them in the ER before they leave. Surgery agrees to come see him in the ED but he will be discharged afterwards. Pt is comfortable and agrees with plans.  Filed Vitals:   04/08/14 1245  BP: 115/77  Pulse: 55  Temp:   Resp:        Marlon Pel, PA-C 04/08/14 1441  Mancel Bale, MD 04/08/14 [phone]

## 2014-04-08 NOTE — ED Notes (Signed)
Pt reports he had inguinal hernia repair and bowel resection on 3/17. D/c home Monday. Pt stared having nv (x1) and loose stool after having watermelon drink with Miralax.

## 2014-07-24 ENCOUNTER — Encounter (HOSPITAL_COMMUNITY): Payer: Self-pay | Admitting: Emergency Medicine

## 2014-07-24 ENCOUNTER — Emergency Department (HOSPITAL_COMMUNITY)
Admission: EM | Admit: 2014-07-24 | Discharge: 2014-07-24 | Disposition: A | Discharge status: Home / Self Care | Payer: BLUE CROSS/BLUE SHIELD | Attending: Emergency Medicine | Admitting: Emergency Medicine

## 2014-07-24 DIAGNOSIS — R109 Unspecified abdominal pain: Secondary | ICD-10-CM | POA: Diagnosis present

## 2014-07-24 DIAGNOSIS — K4090 Unilateral inguinal hernia, without obstruction or gangrene, not specified as recurrent: Secondary | ICD-10-CM | POA: Insufficient documentation

## 2014-07-24 DIAGNOSIS — Z8619 Personal history of other infectious and parasitic diseases: Secondary | ICD-10-CM | POA: Insufficient documentation

## 2014-07-24 LAB — CBC WITH DIFFERENTIAL/PLATELET
BASOS ABS: 0 10*3/uL (ref 0.0–0.1)
BASOS PCT: 1 % (ref 0–1)
EOS PCT: 5 % (ref 0–5)
Eosinophils Absolute: 0.3 10*3/uL (ref 0.0–0.7)
HCT: 46.4 % (ref 39.0–52.0)
Hemoglobin: 16.4 g/dL (ref 13.0–17.0)
LYMPHS PCT: 28 % (ref 12–46)
Lymphs Abs: 1.8 10*3/uL (ref 0.7–4.0)
MCH: 30.8 pg (ref 26.0–34.0)
MCHC: 35.3 g/dL (ref 30.0–36.0)
MCV: 87.1 fL (ref 78.0–100.0)
Monocytes Absolute: 0.5 10*3/uL (ref 0.1–1.0)
Monocytes Relative: 8 % (ref 3–12)
NEUTROS ABS: 3.8 10*3/uL (ref 1.7–7.7)
Neutrophils Relative %: 58 % (ref 43–77)
PLATELETS: 155 10*3/uL (ref 150–400)
RBC: 5.33 MIL/uL (ref 4.22–5.81)
RDW: 13.6 % (ref 11.5–15.5)
WBC: 6.5 10*3/uL (ref 4.0–10.5)

## 2014-07-24 LAB — I-STAT CHEM 8, ED
BUN: 22 mg/dL — ABNORMAL HIGH (ref 6–20)
CREATININE: 1.2 mg/dL (ref 0.61–1.24)
Calcium, Ion: 1.29 mmol/L (ref 1.13–1.30)
Chloride: 101 mmol/L (ref 101–111)
Glucose, Bld: 99 mg/dL (ref 65–99)
HCT: 50 % (ref 39.0–52.0)
HEMOGLOBIN: 17 g/dL (ref 13.0–17.0)
POTASSIUM: 4.3 mmol/L (ref 3.5–5.1)
SODIUM: 140 mmol/L (ref 135–145)
TCO2: 27 mmol/L (ref 0–100)

## 2014-07-24 NOTE — ED Notes (Signed)
Pt does not wish to receive anything for pain at this time.  Environment adjusted.

## 2014-07-24 NOTE — ED Provider Notes (Addendum)
CSN: [id number]     Arrival date & time 07/24/14  1226 History   First MD Initiated Contact with Patient 07/24/14 1239     Chief Complaint  Patient presents with  . Groin Pain  . Hernia     (Consider location/radiation/quality/duration/timing/severity/associated sxs/prior Treatment) HPI Comments: Patient with a history of recurrent right inguinal hernia. He had inguinal incarceration and strangulation with bowel resection in February. 10 days ago he started to notice a bulge in his right groin again. It became painful within the last week but worse in the last 2 days. He was seen by general surgery on Wednesday and was scheduled for repair however today the pain is more intense with loose stool and nausea. But the general surgery office and they recommended he come here for further evaluation.  Patient is a 59 y.o. male presenting with groin pain. The history is provided by the patient.  Groin Pain This is a recurrent problem. The current episode started 2 days ago (intermittent pain this week but constant pain for 2 days). The problem occurs constantly. The problem has been gradually worsening.    Past Medical History  Diagnosis Date  . Right inguinal hernia   . Abdominal pain   . Abdominal distension   . Cough   . Itching   . Pleurisy   . Unspecified viral infection, in conditions classified elsewhere and of unspecified site     viral syndrome - per notes from [facility] dated 01/03/11  . Family history of diabetes mellitus    Past Surgical History  Procedure Laterality Date  . Wisdom tooth extraction    . Inguinal hernia repair  02/01/2011    Procedure: LAPAROSCOPIC INGUINAL HERNIA;  Surgeon: Almond Lint, MD;  Location: [facility] OR;  Service: General;  Laterality: Bilateral;  Laparoscopic inguinal repair with mesh - Bilateral  . Inguinal hernia repair Right 04/02/2014    Procedure: HERNIA REPAIR INGUINAL INCARCERATED ;  Surgeon: Violeta Gelinas, MD;  Location: [facility] OR;  Service: General;   Laterality: Right;  . Bowel resection N/A 04/02/2014    Procedure: SMALL BOWEL RESECTION;  Surgeon: Violeta Gelinas, MD;  Location: [facility] OR;  Service: General;  Laterality: N/A;   History reviewed. No pertinent family history. History  Substance Use Topics  . Smoking status: Never Smoker   . Smokeless tobacco: Never Used  . Alcohol Use: Yes     Comment: 1 - 3 wine or beer weekly    Review of Systems  All other systems reviewed and are negative.     Allergies  Review of patient's allergies indicates no known allergies.  Home Medications   Prior to Admission medications   Medication Sig Start Date End Date Taking? Authorizing Provider  aspirin 325 MG EC tablet Take 325 mg by mouth every 6 (six) hours as needed for pain.    Historical Provider, MD  bismuth subsalicylate (PEPTO BISMOL) 262 MG/15ML suspension Take 15 mLs by mouth every 6 (six) hours as needed. For stomach upset     Historical Provider, MD  docusate sodium (COLACE) 100 MG capsule Take 1 capsule (100 mg total) by mouth 2 (two) times daily as needed for mild constipation. 04/06/14   Nonie Hoyer, PA-C  lisinopril-hydrochlorothiazide (PRINZIDE,ZESTORETIC) 20-12.5 MG per tablet Take 1 tablet by mouth daily.    Historical Provider, MD  Multiple Vitamin (MULITIVITAMIN WITH MINERALS) TABS Take 1 tablet by mouth daily.      Historical Provider, MD  ondansetron (ZOFRAN) 4 MG tablet Take 1 tablet (  4 mg total) by mouth every 6 (six) hours. 04/08/14   Tiffany Neva SeatGreene, PA-C  traMADol (ULTRAM) 50 MG tablet Take 1-2 tablets (50-100 mg total) by mouth every 6 (six) hours as needed for moderate pain or severe pain. 04/06/14   Megan N Baird, PA-C   BP 130/80 mmHg  Pulse 51  Temp(Src) 98.1 F (36.7 C) (Oral)  Resp 16  Ht 6\' 5"  (1.956 m)  Wt 216 lb (97.977 kg)  BMI 25.61 kg/m2  SpO2 97% Physical Exam  Constitutional: He is oriented to person, place, and time. He appears well-developed and well-nourished. No distress.  HENT:  Head:  Normocephalic and atraumatic.  Mouth/Throat: Oropharynx is clear and moist.  Eyes: Conjunctivae and EOM are normal. Pupils are equal, round, and reactive to light.  Neck: Normal range of motion. Neck supple.  Cardiovascular: Normal rate, regular rhythm and intact distal pulses.   No murmur heard. Pulmonary/Chest: Effort normal and breath sounds normal. No respiratory distress. He has no wheezes. He has no rales.  Abdominal: Soft. He exhibits no distension. There is tenderness. There is no rebound and no guarding. A hernia is present. Hernia confirmed positive in the right inguinal area.    Musculoskeletal: Normal range of motion. He exhibits no edema or tenderness.  Neurological: He is alert and oriented to person, place, and time.  Skin: Skin is warm and dry. No rash noted. No erythema.  Psychiatric: He has a normal mood and affect. His behavior is normal.  Nursing note and vitals reviewed.   ED Course  Procedures (including critical care time) Labs Review Labs Reviewed  I-STAT CHEM 8, ED - Abnormal; Notable for the following:    BUN 22 (*)    All other components within normal limits  CBC WITH DIFFERENTIAL/PLATELET    Imaging Review No results found.   EKG Interpretation None      MDM   Final diagnoses:  Right inguinal hernia    Patient presents today with a history of recurrent right inguinal hernia status post repair 16 weeks ago who has noticed a bulging in his groin again pain starting this week. Today he has had loose stool and nausea with persistent pain in the right groin. On exam there is a bulge over his right inguinal incision which is tender to touch but is soft and reducible. Surgery contacted.  2:22 PM Patient seen by general surgery. Hernia is reducible and patient safety be discharged home. They will follow-up in the office and schedule hernia repair.  Gwyneth SproutWhitney Valla Pacey, MD 07/24/14 1252  Gwyneth SproutWhitney Buelah Rennie, MD 07/24/14 1423  Gwyneth SproutWhitney Ferdinando Lodge,  MD 07/24/14 1423

## 2014-07-24 NOTE — ED Notes (Signed)
General surgery at bedside. 

## 2014-07-24 NOTE — Discharge Instructions (Signed)

## 2014-07-24 NOTE — Consult Note (Signed)
Alex Arias 07/25/1955  [id number].   Primary Care MD: Dr. Rosezetta Schlatter Requesting MD: Dr. Gwyneth Sprout Chief Complaint/Reason for Consult: right inguinal hernia HPI: This is a 59 yo white male who underwent lap inguinal hernia repair in the past with a recurrence.  He was scheduled for elective repair and ended up with an incarcerated hernia prior to that time and required an open repair with a SBR for ischemia.  He is 4 months s/p his last repair.  About 2 weeks ago he noticed a recurrent bulge and has developed some intermittent pain.  He has continued to have BMs, but did develop some nausea today.  He is concerned about this given his prior episode and called our office.  He was referred to the [facility] where we were called for evaluation.  ROS : Please see HPI, otherwise negative   History reviewed. No pertinent family history.  Past Medical History  Diagnosis Date  . Right inguinal hernia   . Abdominal pain   . Abdominal distension   . Cough   . Itching   . Pleurisy   . Unspecified viral infection, in conditions classified elsewhere and of unspecified site     viral syndrome - per notes from [facility] dated 01/03/11  . Family history of diabetes mellitus     Past Surgical History  Procedure Laterality Date  . Wisdom tooth extraction    . Inguinal hernia repair  02/01/2011    Procedure: LAPAROSCOPIC INGUINAL HERNIA;  Surgeon: Almond Lint, MD;  Location: [facility] OR;  Service: General;  Laterality: Bilateral;  Laparoscopic inguinal repair with mesh - Bilateral  . Inguinal hernia repair Right 04/02/2014    Procedure: HERNIA REPAIR INGUINAL INCARCERATED ;  Surgeon: Violeta Gelinas, MD;  Location: [facility] OR;  Service: General;  Laterality: Right;  . Bowel resection N/A 04/02/2014    Procedure: SMALL BOWEL RESECTION;  Surgeon: Violeta Gelinas, MD;  Location: [facility] OR;  Service: General;  Laterality: N/A;    Social History:  reports that he has never smoked. He has never used smokeless tobacco. He  reports that he drinks alcohol. He reports that he does not use illicit drugs.  Allergies: No Known Allergies   (Not in a hospital admission)  Blood pressure 124/80, pulse 53, temperature 98 F (36.7 C), temperature source Oral, resp. rate 16, height  (1.956 m), weight 97.977 kg (216 lb), SpO2 98 %. Physical Exam: General: pleasant, WD, WN white male who is laying in bed in NAD HEENT: head is normocephalic, atraumatic.  Sclera are noninjected.  PERRL.  Ears and nose without any masses or lesions.  Mouth is pink and moist Heart: regular, rate, and rhythm.  Normal s1,s2. No obvious murmurs, gallops, or rubs noted.  Palpable radial and pedal pulses bilaterally Lungs: CTAB, no wheezes, rhonchi, or rales noted.  Respiratory effort nonlabored Abd: soft, NT, ND, +BS, no masses or organomegaly.  Right inguinal hernia is soft and reducible.  Tender to palpation. MS: all 4 extremities are symmetrical with no cyanosis, clubbing, or edema. Skin: warm and dry with no masses, lesions, or rashes Psych: A&Ox3 with an appropriate affect.    Results for orders placed or performed during the hospital encounter of 07/24/14 (from the past 48 hour(s))  CBC with Differential/Platelet     Status: None   Collection Time: 07/24/14 12:55 PM  Result Value Ref Range   WBC 6.5 4.0 - 10.5 K/uL   RBC 5.33 4.22 - 5.81 MIL/uL   Hemoglobin 16.4 13.0 - 17.0  g/dL   HCT 08.646.4 57.839.0 - 46.952.0 %   MCV 87.1 78.0 - 100.0 fL   MCH 30.8 26.0 - 34.0 pg   MCHC 35.3 30.0 - 36.0 g/dL   RDW 62.913.6 52.811.5 - 41.315.5 %   Platelets 155 150 - 400 K/uL   Neutrophils Relative % 58 43 - 77 %   Neutro Abs 3.8 1.7 - 7.7 K/uL   Lymphocytes Relative 28 12 - 46 %   Lymphs Abs 1.8 0.7 - 4.0 K/uL   Monocytes Relative 8 3 - 12 %   Monocytes Absolute 0.5 0.1 - 1.0 K/uL   Eosinophils Relative 5 0 - 5 %   Eosinophils Absolute 0.3 0.0 - 0.7 K/uL   Basophils Relative 1 0 - 1 %   Basophils Absolute 0.0 0.0 - 0.1 K/uL  I-stat chem 8, ed     Status:  Abnormal   Collection Time: 07/24/14  1:14 PM  Result Value Ref Range   Sodium 140 135 - 145 mmol/L   Potassium 4.3 3.5 - 5.1 mmol/L   Chloride 101 101 - 111 mmol/L   BUN 22 (H) 6 - 20 mg/dL   Creatinine, Ser 2.441.20 0.61 - 1.24 mg/dL   Glucose, Bld 99 65 - 99 mg/dL   Calcium, Ion 0.101.29 2.721.13 - 1.30 mmol/L   TCO2 27 0 - 100 mmol/L   Hemoglobin 17.0 13.0 - 17.0 g/dL   HCT 53.650.0 64.439.0 - 03.452.0 %   No results found.     Assessment/Plan 1. Recurrent right inguinal hernia -will set him up for elective surgery.  Dr. Derrell Lollingamirez has called the scheduler to go ahead and find a surgery date and time. -we have advised a truss for extra support in the meantime.   -they understand and agree this plan.  Tate Zagal E 07/24/2014, 2:44 PM Pager: [phone]

## 2014-07-24 NOTE — ED Notes (Signed)
Pt sts hx of right sided incarcerated hernia with repair 16 weeks ago; pt sts reoccurrence of hernia with some pain a nausea worse today; pt told to come and page Dr Derrell Lollingamirez [place]

## 2015-06-25 ENCOUNTER — Ambulatory Visit: Payer: BLUE CROSS/BLUE SHIELD

## 2015-06-25 ENCOUNTER — Encounter: Payer: Self-pay | Admitting: Podiatry

## 2015-06-25 ENCOUNTER — Ambulatory Visit (INDEPENDENT_AMBULATORY_CARE_PROVIDER_SITE_OTHER): Payer: BLUE CROSS/BLUE SHIELD

## 2015-06-25 ENCOUNTER — Ambulatory Visit (INDEPENDENT_AMBULATORY_CARE_PROVIDER_SITE_OTHER): Payer: BLUE CROSS/BLUE SHIELD | Admitting: Podiatry

## 2015-06-25 VITALS — BP 147/84 | HR 59 | Resp 16 | Ht 76.5 in | Wt 215.0 lb

## 2015-06-25 DIAGNOSIS — M779 Enthesopathy, unspecified: Secondary | ICD-10-CM

## 2015-06-25 DIAGNOSIS — M79671 Pain in right foot: Secondary | ICD-10-CM | POA: Diagnosis not present

## 2015-06-25 DIAGNOSIS — M79672 Pain in left foot: Secondary | ICD-10-CM

## 2015-06-25 NOTE — Progress Notes (Signed)
Subjective:     Patient ID: Alex Arias, male   DOB: 03/20/1955, 60 y.o.   MRN: [id number]  HPI patient points to the bottom of both feet and states that they can get sore especially after sitting or after Lane down. The left bothers him more and there is some redness around the side and there is collapse of the arch noted with ambulation   Review of Systems  All other systems reviewed and are negative.      Objective:   Physical Exam  Constitutional: He is oriented to person, place, and time.  Cardiovascular: Intact distal pulses.   Musculoskeletal: Normal range of motion.  Neurological: He is oriented to person, place, and time.  Skin: Skin is warm.  Nursing note and vitals reviewed.  neurovascular status intact muscle strength adequate range of motion within normal limits with patient found to have discomfort in the plantar medial aspect of the foot left over right with collapse and medial longitudinal arch and pain when palpated. 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 Patient will be seen back when orthotics are ready and does want a second pair which we will do at one point in the future  X-rays indicate collapse medial longitudinal arch left over right with breech of the navicular cuneiform and slightly talonavicular joint left over right

## 2015-06-25 NOTE — Progress Notes (Signed)
   Subjective:    Patient ID: Alex LundRalph Arias, male    DOB: Dec 27, 1955, 60 y.o.   MRN: [id number]  HPI Chief Complaint  Patient presents with  . Foot Pain    Bilateral; entire feet hurt; pt stated, "Feels soreness when standing"; x1 yr      Review of Systems  Musculoskeletal: Positive for arthralgias.  All other systems reviewed and are negative.      Objective:   Physical Exam        Assessment & Plan:

## 2015-07-27 ENCOUNTER — Ambulatory Visit: Payer: BLUE CROSS/BLUE SHIELD | Admitting: *Deleted

## 2015-07-27 DIAGNOSIS — M779 Enthesopathy, unspecified: Secondary | ICD-10-CM

## 2015-07-27 NOTE — Progress Notes (Signed)
Patient ID: [name], male   DOB: 11-Jan-1956, 60 y.o.   MRN: [id number] Patient presents for orthotic pick up.  Verbal and written break in and wear instructions given.  Patient will follow up in 4 weeks if symptoms worsen or fail to improve.

## 2015-07-27 NOTE — Patient Instructions (Signed)

## 2015-10-21 ENCOUNTER — Telehealth: Payer: Self-pay | Admitting: *Deleted

## 2015-10-21 NOTE — Telephone Encounter (Signed)
10/19/15  I received a pair of orthotics this summer and I would like to order 2 or 3 more pairs depending on the cost.  Please call me when you can.  10/21/15  Called and left message for patient that additional pairs of orthotics are $199 each that is required to be paid prior to ordering.  When ready to order please call Charity at [phone] who will take the payment and then let me know how many pairs to order.  It takes about 2 weeks for them to return.  If he has any additional questions please call me

## 2015-10-26 DIAGNOSIS — M722 Plantar fascial fibromatosis: Secondary | ICD-10-CM

## 2015-11-23 ENCOUNTER — Encounter: Payer: Self-pay | Admitting: Podiatry

## 2015-11-23 ENCOUNTER — Telehealth: Payer: Self-pay | Admitting: Podiatry

## 2015-11-23 NOTE — Telephone Encounter (Signed)
Patient came in on 11/23/2015 4:54pm to [facility]

## 2015-12-13 DIAGNOSIS — I10 Essential (primary) hypertension: Secondary | ICD-10-CM | POA: Diagnosis present

## 2016-12-20 ENCOUNTER — Ambulatory Visit
Admission: RE | Admit: 2016-12-20 | Discharge: 2016-12-20 | Disposition: A | Discharge status: Home / Self Care | Payer: BLUE CROSS/BLUE SHIELD | Source: Ambulatory Visit | Attending: Family Medicine | Admitting: Family Medicine

## 2016-12-20 ENCOUNTER — Other Ambulatory Visit: Payer: Self-pay | Admitting: Family Medicine

## 2016-12-20 DIAGNOSIS — R05 Cough: Secondary | ICD-10-CM

## 2016-12-20 DIAGNOSIS — R059 Cough, unspecified: Secondary | ICD-10-CM

## 2017-02-21 ENCOUNTER — Ambulatory Visit
Admission: RE | Admit: 2017-02-21 | Discharge: 2017-02-21 | Disposition: A | Discharge status: Home / Self Care | Payer: BLUE CROSS/BLUE SHIELD | Source: Ambulatory Visit | Attending: Family Medicine | Admitting: Family Medicine

## 2017-02-21 ENCOUNTER — Other Ambulatory Visit: Payer: Self-pay | Admitting: Family Medicine

## 2017-02-21 DIAGNOSIS — M21962 Unspecified acquired deformity of left lower leg: Secondary | ICD-10-CM

## 2019-05-23 ENCOUNTER — Encounter (HOSPITAL_COMMUNITY): Payer: Self-pay

## 2019-05-23 ENCOUNTER — Other Ambulatory Visit: Payer: Self-pay

## 2019-05-23 ENCOUNTER — Emergency Department (HOSPITAL_COMMUNITY)
Admission: EM | Admit: 2019-05-23 | Discharge: 2019-05-23 | Disposition: A | Discharge status: Home / Self Care | Payer: BC Managed Care – PPO | Attending: Emergency Medicine | Admitting: Emergency Medicine

## 2019-05-23 ENCOUNTER — Emergency Department (HOSPITAL_COMMUNITY): Payer: BC Managed Care – PPO

## 2019-05-23 DIAGNOSIS — K5732 Diverticulitis of large intestine without perforation or abscess without bleeding: Secondary | ICD-10-CM | POA: Insufficient documentation

## 2019-05-23 DIAGNOSIS — Z79899 Other long term (current) drug therapy: Secondary | ICD-10-CM | POA: Insufficient documentation

## 2019-05-23 DIAGNOSIS — Z7982 Long term (current) use of aspirin: Secondary | ICD-10-CM | POA: Diagnosis not present

## 2019-05-23 DIAGNOSIS — R1030 Lower abdominal pain, unspecified: Secondary | ICD-10-CM | POA: Diagnosis present

## 2019-05-23 DIAGNOSIS — K5792 Diverticulitis of intestine, part unspecified, without perforation or abscess without bleeding: Secondary | ICD-10-CM

## 2019-05-23 LAB — CBC WITH DIFFERENTIAL/PLATELET
Abs Immature Granulocytes: 0.04 10*3/uL (ref 0.00–0.07)
Basophils Absolute: 0.1 10*3/uL (ref 0.0–0.1)
Basophils Relative: 1 %
Eosinophils Absolute: 0.2 10*3/uL (ref 0.0–0.5)
Eosinophils Relative: 2 %
HCT: 44.9 % (ref 39.0–52.0)
Hemoglobin: 15.1 g/dL (ref 13.0–17.0)
Immature Granulocytes: 0 %
Lymphocytes Relative: 17 %
Lymphs Abs: 1.7 10*3/uL (ref 0.7–4.0)
MCH: 31.1 pg (ref 26.0–34.0)
MCHC: 33.6 g/dL (ref 30.0–36.0)
MCV: 92.6 fL (ref 80.0–100.0)
Monocytes Absolute: 1.1 10*3/uL — ABNORMAL HIGH (ref 0.1–1.0)
Monocytes Relative: 11 %
Neutro Abs: 6.9 10*3/uL (ref 1.7–7.7)
Neutrophils Relative %: 69 %
Platelets: 141 10*3/uL — ABNORMAL LOW (ref 150–400)
RBC: 4.85 MIL/uL (ref 4.22–5.81)
RDW: 13.4 % (ref 11.5–15.5)
WBC: 10 10*3/uL (ref 4.0–10.5)
nRBC: 0 % (ref 0.0–0.2)

## 2019-05-23 LAB — COMPREHENSIVE METABOLIC PANEL
ALT: 26 U/L (ref 0–44)
AST: 19 U/L (ref 15–41)
Albumin: 3.6 g/dL (ref 3.5–5.0)
Alkaline Phosphatase: 46 U/L (ref 38–126)
Anion gap: 8 (ref 5–15)
BUN: 16 mg/dL (ref 8–23)
CO2: 24 mmol/L (ref 22–32)
Calcium: 8.4 mg/dL — ABNORMAL LOW (ref 8.9–10.3)
Chloride: 105 mmol/L (ref 98–111)
Creatinine, Ser: 1.23 mg/dL (ref 0.61–1.24)
GFR calc Af Amer: >60 mL/min (ref >60)
GFR calc non Af Amer: >60 mL/min (ref >60)
Glucose, Bld: 113 mg/dL — ABNORMAL HIGH (ref 70–99)
Potassium: 3.9 mmol/L (ref 3.5–5.1)
Sodium: 137 mmol/L (ref 135–145)
Total Bilirubin: 1.3 mg/dL — ABNORMAL HIGH (ref 0.3–1.2)
Total Protein: 6.6 g/dL (ref 6.5–8.1)

## 2019-05-23 LAB — URINALYSIS, ROUTINE W REFLEX MICROSCOPIC
Bilirubin Urine: NEGATIVE
Glucose, UA: NEGATIVE mg/dL
Hgb urine dipstick: NEGATIVE
Ketones, ur: NEGATIVE mg/dL
Leukocytes,Ua: NEGATIVE
Nitrite: NEGATIVE
Protein, ur: NEGATIVE mg/dL
Specific Gravity, Urine: 1.015 (ref 1.005–1.030)
pH: 5 (ref 5.0–8.0)

## 2019-05-23 LAB — LACTIC ACID, PLASMA: Lactic Acid, Venous: 0.8 mmol/L (ref 0.5–1.9)

## 2019-05-23 LAB — LIPASE, BLOOD: Lipase: 26 U/L (ref 11–51)

## 2019-05-23 MED ORDER — ONDANSETRON 4 MG PO TBDP: 4.0000 mg | ORAL_TABLET | Freq: Three times a day (TID) | ORAL | 0 refills | Status: DC | PRN

## 2019-05-23 MED ORDER — SODIUM CHLORIDE 0.9 % IV BOLUS
1000.0000 mL | Freq: Once | INTRAVENOUS | Status: AC
  Administered 2019-05-23: 08:00:00 1000 mL via INTRAVENOUS

## 2019-05-23 MED ORDER — CIPROFLOXACIN HCL 500 MG PO TABS: 500.0000 mg | ORAL_TABLET | Freq: Two times a day (BID) | ORAL | 0 refills | Status: DC

## 2019-05-23 MED ORDER — METRONIDAZOLE 500 MG PO TABS: 500.0000 mg | ORAL_TABLET | Freq: Three times a day (TID) | ORAL | 0 refills | Status: AC

## 2019-05-23 MED ORDER — IOHEXOL 300 MG/ML  SOLN
100.0000 mL | Freq: Once | INTRAMUSCULAR | Status: AC | PRN
  Administered 2019-05-23: 10:00:00 100 mL via INTRAVENOUS

## 2019-05-23 NOTE — ED Notes (Signed)
Pt to CT

## 2019-05-23 NOTE — ED Provider Notes (Signed)
[facility] EMERGENCY DEPARTMENT Provider Note   CSN: [id number] Arrival date & time: 05/23/19  7628     History Chief Complaint  Patient presents with   Abdominal Pain   Fever    Alex Arias is a 64 y.o. male who presents to the ED today with complaints of gradual onset, constant, worsening, lower abdominal pain L > R x 3 days. Pt also complains of intermittent back pain specifically on the right side however states the abdominal pain and back pain do not seem connected. + Nausea. No recent heavy lifting or increase in straining. Pt with hx of multiple inguinal hernia repairs however states this feels different. Wife mentions pt began having a fever last night with tmax 101.1; he took Advil around 4 AM this morning. Temp in the ED 98.5. Pt still having regular BMs; wife states he is not passing gas from below however he is belching more. Pt denies chills, chest pain, SOB, urinary complaints, testicular pain/swelling, vomiting, or any other associated symptoms.  Wife briefly mentions pt has also had a slight cough - he has been vaccinated against COVID with pfizer vaccine in March.   Pt was in [place], [place] yesterday and seen at a UC; had labs done including CBC, CMP, and lipase which are unfortunately not in our system. U/A with increased specific gravity but no other acute findings including no hgb. Acute abdominal series without acute findings. Wife mentions that pt's WBC count was elevated however they do not have records with them. They were told to go to an ED for further evaluation and drove back in the middle of the night. They went to [facility] and were told to come to the ED for further evaluation.   PSHx: Bilateral inguinal hernia repair by Dr. Laurelyn Sickle 02/01/2011 R inguinal hernia repair from incarcerated hernia and small bowel resection by Dr. Violeta Gelinas 04/02/2014 Repair of recurrent R inguinal hernia at [facility] by Dr. Luiz Blare on  03/27/2016  The history is provided by the patient, the spouse and medical records.       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   Incarcerated right inguinal hernia 04/02/2014   Recurrent unilateral inguinal hernia with obstruction and without gangrene 04/02/2014   Bilateral inguinal hernia 01/23/2011    Past Surgical History:  Procedure Laterality Date   BOWEL RESECTION N/A 04/02/2014   Procedure: SMALL BOWEL RESECTION;  Surgeon: Violeta Gelinas, MD;  Location: [facility] OR;  Service: General;  Laterality: N/A;   INGUINAL HERNIA REPAIR  02/01/2011   Procedure: LAPAROSCOPIC INGUINAL HERNIA;  Surgeon: Almond Lint, MD;  Location: [facility] OR;  Service: General;  Laterality: Bilateral;  Laparoscopic inguinal repair with mesh - Bilateral   INGUINAL HERNIA REPAIR Right 04/02/2014   Procedure: HERNIA REPAIR INGUINAL INCARCERATED ;  Surgeon: Violeta Gelinas, MD;  Location: [facility] OR;  Service: General;  Laterality: Right;   WISDOM TOOTH EXTRACTION         No family history on file.  Social History   Tobacco Use  Smoking status: Never Smoker   Smokeless tobacco: Never Used  Substance Use Topics   Alcohol use: Yes    Comment: 1 - 3 wine or beer weekly   Drug use: No    Home Medications Prior to Admission medications   Medication Sig Start Date End Date Taking? Authorizing Provider  bismuth subsalicylate (PEPTO BISMOL) 262 MG/15ML suspension Take 15 mLs by mouth every 6 (six) hours as needed. For stomach upset    Yes [provider]  levothyroxine (SYNTHROID) 88 MCG tablet Take 88  mcg by mouth daily. 05/12/19  Yes [provider]  Multiple Vitamin (MULTI-VITAMIN) tablet Take 1 tablet by mouth daily.   Yes [provider]  olmesartan-hydrochlorothiazide (BENICAR HCT) 20-12.5 MG tablet Take 1 tablet by mouth daily. 03/18/19  Yes [provider]  aspirin 325 MG EC tablet Take 325 mg by mouth every 6 (six) hours as needed for pain.    [provider]  ciprofloxacin (CIPRO) 500 MG tablet Take 1 tablet (500 mg total) by mouth 2 (two) times daily. 05/23/19   Alroy Bailiff, Eutimio Gharibian, PA-C  metroNIDAZOLE (FLAGYL) 500 MG tablet Take 1 tablet (500 mg total) by mouth 3 (three) times daily for 7 days. 05/23/19 05/30/19  Eustaquio Maize, PA-C  Multiple Vitamin (MULITIVITAMIN WITH MINERALS) TABS Take 1 tablet by mouth daily.      [provider]  ondansetron (ZOFRAN ODT) 4 MG disintegrating tablet Take 1 tablet (4 mg total) by mouth every 8 (eight) hours as needed for nausea or vomiting. 05/23/19   Eustaquio Maize, PA-C    Allergies    Patient has no known allergies.  Review of Systems   Review of Systems  Constitutional: Positive for fever. Negative for chills.  Respiratory: Positive for cough. Negative for shortness of breath.   Cardiovascular: Negative for chest pain.  Gastrointestinal: Positive for abdominal pain and nausea. Negative for blood in stool, constipation, diarrhea and vomiting.  Genitourinary: Negative for difficulty urinating, dysuria, hematuria, scrotal swelling and testicular pain.  All other systems reviewed and are negative.   Physical Exam Updated Vital Signs BP 123/85    Pulse 82    Temp 98.5 F (36.9 C) (Oral)    Resp 16    Ht 6\' 4"  (1.93 m)    Wt 102.1 kg    SpO2 99%    BMI 27.39 kg/m   Physical Exam Vitals and nursing note reviewed.  Constitutional:      Appearance: He is not ill-appearing or diaphoretic.  HENT:     Head: Normocephalic and atraumatic.  Eyes:     Conjunctiva/sclera: Conjunctivae normal.  Cardiovascular:      Rate and Rhythm: Normal rate and regular rhythm.     Heart sounds: Normal heart sounds.  Pulmonary:     Effort: Pulmonary effort is normal.     Breath sounds: Normal breath sounds. No wheezing, rhonchi or rales.     Comments: Not actively coughing in the room. Able to speak in full sentences without difficulty. Satting 99% on RA. LCTAB.  Abdominal:     General: Bowel sounds are normal.     Palpations: Abdomen is soft.     Tenderness: There is abdominal tenderness in the left lower quadrant. There is guarding (voluntary). There is no right CVA tenderness, left CVA tenderness or rebound. Negative signs include McBurney's sign.     Hernia: No hernia is present.     Comments: Surgical scars noted to bilateral inguinal regions, abdomen appears distended, no overlying skin  changes, + LLQ abdominal TTP, +BS throughout, no r/g/r, neg murphy's, neg mcburney's, no CVA TTP  Musculoskeletal:     Cervical back: Neck supple.  Skin:    General: Skin is warm and dry.  Neurological:     Mental Status: He is alert.     ED Results / Procedures / Treatments   Labs (all labs ordered are listed, but only abnormal results are displayed) Labs Reviewed  COMPREHENSIVE METABOLIC PANEL - Abnormal; Notable for the following components:      Result Value   Glucose, Bld 113 (*)    Calcium 8.4 (*)    Total Bilirubin 1.3 (*)    All other components within normal limits  CBC WITH DIFFERENTIAL/PLATELET - Abnormal; Notable for the following components:   Platelets 141 (*)    Monocytes Absolute 1.1 (*)    All other components within normal limits  LIPASE, BLOOD  LACTIC ACID, PLASMA  URINALYSIS, ROUTINE W REFLEX MICROSCOPIC  LACTIC ACID, PLASMA    EKG None  Radiology CT Abdomen Pelvis W Contrast  Result Date: 05/23/2019 CLINICAL DATA:  Left lower quadrant pain and nausea for 4 days. EXAM: CT ABDOMEN AND PELVIS WITH CONTRAST TECHNIQUE: Multidetector CT imaging of the abdomen and pelvis was performed  using the standard protocol following bolus administration of intravenous contrast. CONTRAST:  OMNIPAQUE IOHEXOL 300 MG/ML  SOLN COMPARISON:  None. FINDINGS: Lower Chest: No acute findings. Hepatobiliary: No hepatic masses identified. Pancreas:  No mass or inflammatory changes. Spleen: Within normal limits in size and appearance. Adrenals/Urinary Tract: No masses identified. Tiny cyst noted in upper pole of left kidney. No evidence of ureteral calculi or hydronephrosis. Stomach/Bowel: Moderate diverticulitis is seen involving proximal sigmoid colon. No evidence of extraluminal air or abscess. No evidence of bowel obstruction. Vascular/Lymphatic: No pathologically enlarged lymph nodes. No abdominal aortic aneurysm. Aortic atherosclerosis noted. Reproductive:  No mass or other significant abnormality. Other: Postop changes from previous right inguinal hernia repair noted. No evidence of recurrent hernia or mass. Musculoskeletal:  No suspicious bone lesions identified. IMPRESSION: Moderate sigmoid diverticulitis. No evidence of abscess or other complication. Aortic Atherosclerosis (ICD10-I70.0). Electronically Signed   By: Danae Orleans M.D.   On: 05/23/2019 10:10    Procedures Procedures (including critical care time)  Medications Ordered in ED Medications  sodium chloride 0.9 % bolus 1,000 mL (0 mLs Intravenous Stopped 05/23/19 1024)  iohexol (OMNIPAQUE) 300 MG/ML solution 100 mL (100 mLs Intravenous Contrast Given 05/23/19 0941)    ED Course  I have reviewed the triage vital signs and the nursing notes.  Pertinent labs & imaging results that were available during my care of the patient were reviewed by me and considered in my medical decision making (see chart for details).  Clinical Course as of May 23 1035  Fri May 23, 2019  0842 WBC: 10.0 [MV]  0904 Lactic Acid, Venous: 0.8 [MV]    Clinical Course User Index [MV] Tanda Rockers, PA-C   MDM Rules/Calculators/A&P                       64 year old male who presents to the ED today complaining of lower abdominal pain, worse on left than right.  Seen in urgent care yesterday with work-up and told to come to ED for further evaluation.  Past surgical history of bilateral inguinal hernia repairs as well as incarcerated hernia on the right side with bowel resection in 2016 by Dr. Violeta Gelinas.  On arrival to the ED  patient is afebrile, nontachycardic and nontachypneic.  He did have Advil around 4 AM this morning after noted fever of 101.1.  Abdomen does appear more distended.  He has obvious tenderness to the left lower quadrant.  He has voluntary guarding.  Does complain of some lower back pain however he has no CVA tenderness bilaterally.  No urinary complaints.  No overlying skin changes to the abdomen and no obvious hernia palpated, there is concern for diverticulitis or other intra-abdominal infection at this point given fever and abdominal pain.  Will work-up for same with lab work including lactic acid with fever to rule out sepsis.  Will obtain CT abdomen and pelvis at this time.  Patient appears comfortable currently however wife states that he is a high pain tolerance.  He rates his pain a 1 out of 10 currently does not want anything for pain.  We will continue to monitor.   CBC without leukocytosis. Platelets appear to be at pt's baseline CMP with glucose 113, no other electrolyte abnormalities. LFTs within normal limits.  Lipase 26 Lactic acid 0.8 U/A without infection  CT scan shows moderate diverticulitis in the sigmoid. No abscess or other abnormality today.   On reeval pt continues to appear comfortable. Will discharge home at this time with Flagyl and Cipro. Zofran PRN for nausea. Pt advised to follow up with PCP early next week for reeval. Strict return precautions have been discussed with pt including worsening pain at any time or no improvement in 48 hours of abx use. Pt and wife are in agreement with plan and pt  stable for discharge home.   This note was prepared using Dragon voice recognition software and may include unintentional dictation errors due to the inherent limitations of voice recognition software.  Final Clinical Impression(s) / ED Diagnoses Final diagnoses:  Diverticulitis    Rx / DC Orders ED Discharge Orders         Ordered    ondansetron (ZOFRAN ODT) 4 MG disintegrating tablet  Every 8 hours PRN     05/23/19 1031    metroNIDAZOLE (FLAGYL) 500 MG tablet  3 times daily     05/23/19 1031    ciprofloxacin (CIPRO) 500 MG tablet  2 times daily     05/23/19 1031           Discharge Instructions     Your CT scan showed what is known as diverticulitis - infection in outpouchings of your distal colon Please pick up antibiotics and take as prescribed. I have also filled your zofran for nausea to use as needed Follow up with your PCP early next week (Monday-Wednesday) for reevaluation  Monitor your symptoms at home and take Tylenol as needed for fevers Increase the water in your diet. It is also important to soften stool naturally with OTC Miralax and increasing the amount of fiber in your diet You should start seeing improvement in your symptoms after 48 hours on antibiotics Return to the ED IMMEDIATELY if worsening pain at any time, no improvement in symptoms whatsoever after 48 hours on antibiotics, excessive vomiting, blood in stool, dark colored stool.         Tanda Rockers, PA-C 05/23/19 1037    Benjiman Core, MD 05/23/19 1640

## 2019-05-23 NOTE — Discharge Instructions (Addendum)
Your CT scan showed what is known as diverticulitis - infection in outpouchings of your distal colon Please pick up antibiotics and take as prescribed. I have also filled your zofran for nausea to use as needed Follow up with your PCP early next week (Monday-Wednesday) for reevaluation  Monitor your symptoms at home and take Tylenol as needed for fevers Increase the water in your diet. It is also important to soften stool naturally with OTC Miralax and increasing the amount of fiber in your diet You should start seeing improvement in your symptoms after 48 hours on antibiotics Return to the ED IMMEDIATELY if worsening pain at any time, no improvement in symptoms whatsoever after 48 hours on antibiotics, excessive vomiting, blood in stool, dark colored stool.

## 2019-05-23 NOTE — ED Triage Notes (Signed)
Pt c.o lower abd pain that radiates to his back that started 3 days ago, last BM yesterday. Nausea but no vomiting. Pt has hx of bilateral inguinal hernias with surgical repair 3-4 years ago. Pt also having a headache.

## 2019-05-23 NOTE — ED Notes (Signed)
Patient given discharge instructions. Questions were answered. Patient verbalized understanding of discharge instructions and care at home.  

## 2020-01-18 IMAGING — DX DG FOOT COMPLETE 3+V*L*
3 series · 3 of 3 positions shown · non-contrast
Comparison: 06/25/2015

CLINICAL DATA: Foot pain.

EXAM:
LEFT FOOT - COMPLETE 3+ VIEW

[dg foot complete left (1 of 3)]
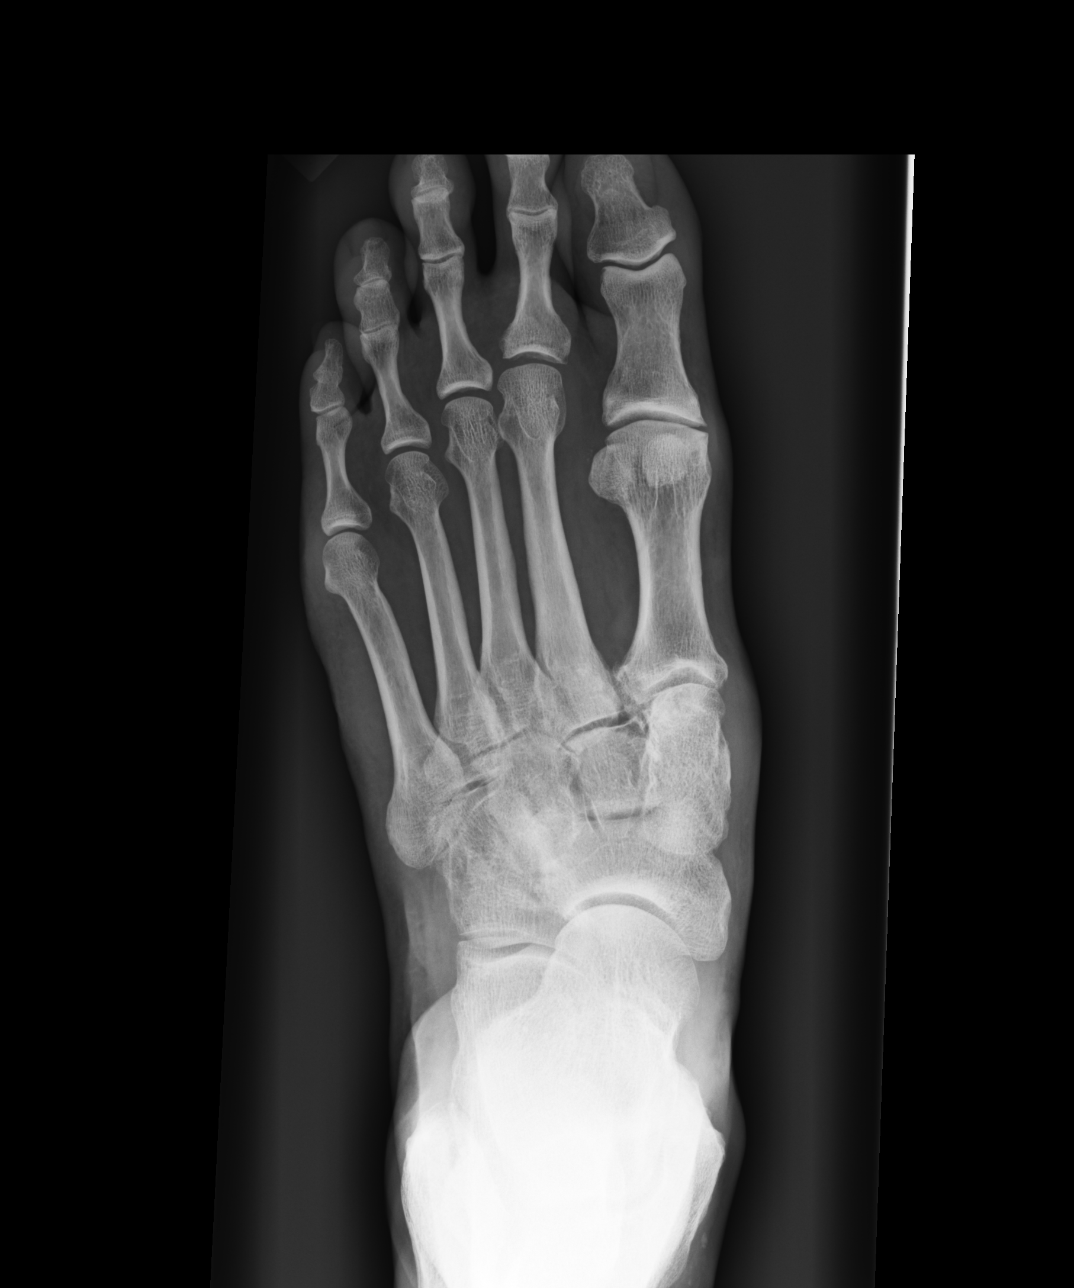

[dg foot complete left (2 of 3)]
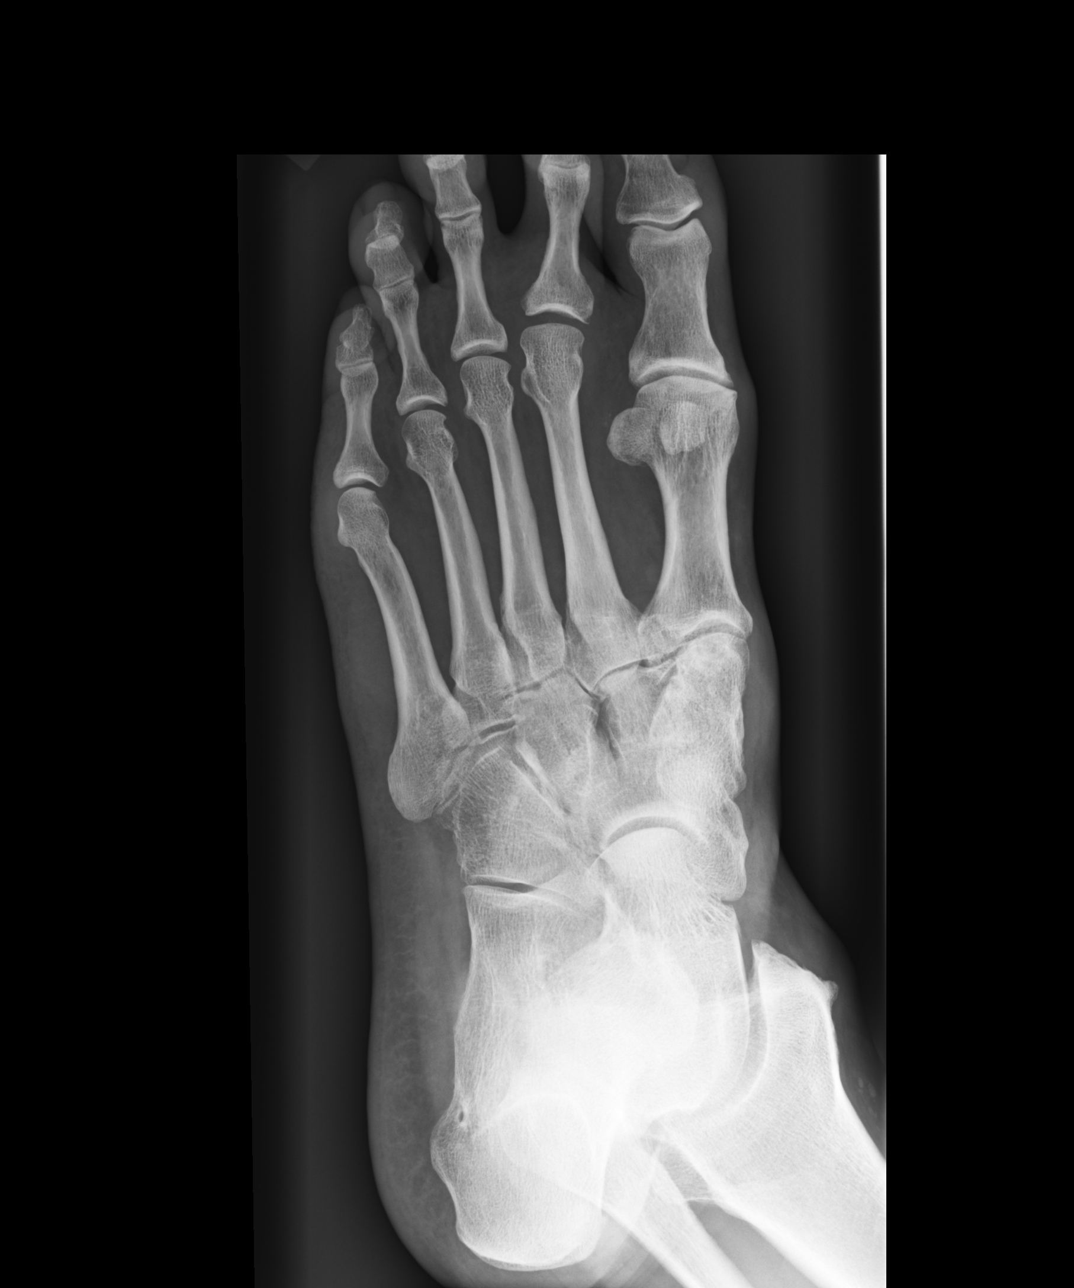

[dg foot complete left (3 of 3)]
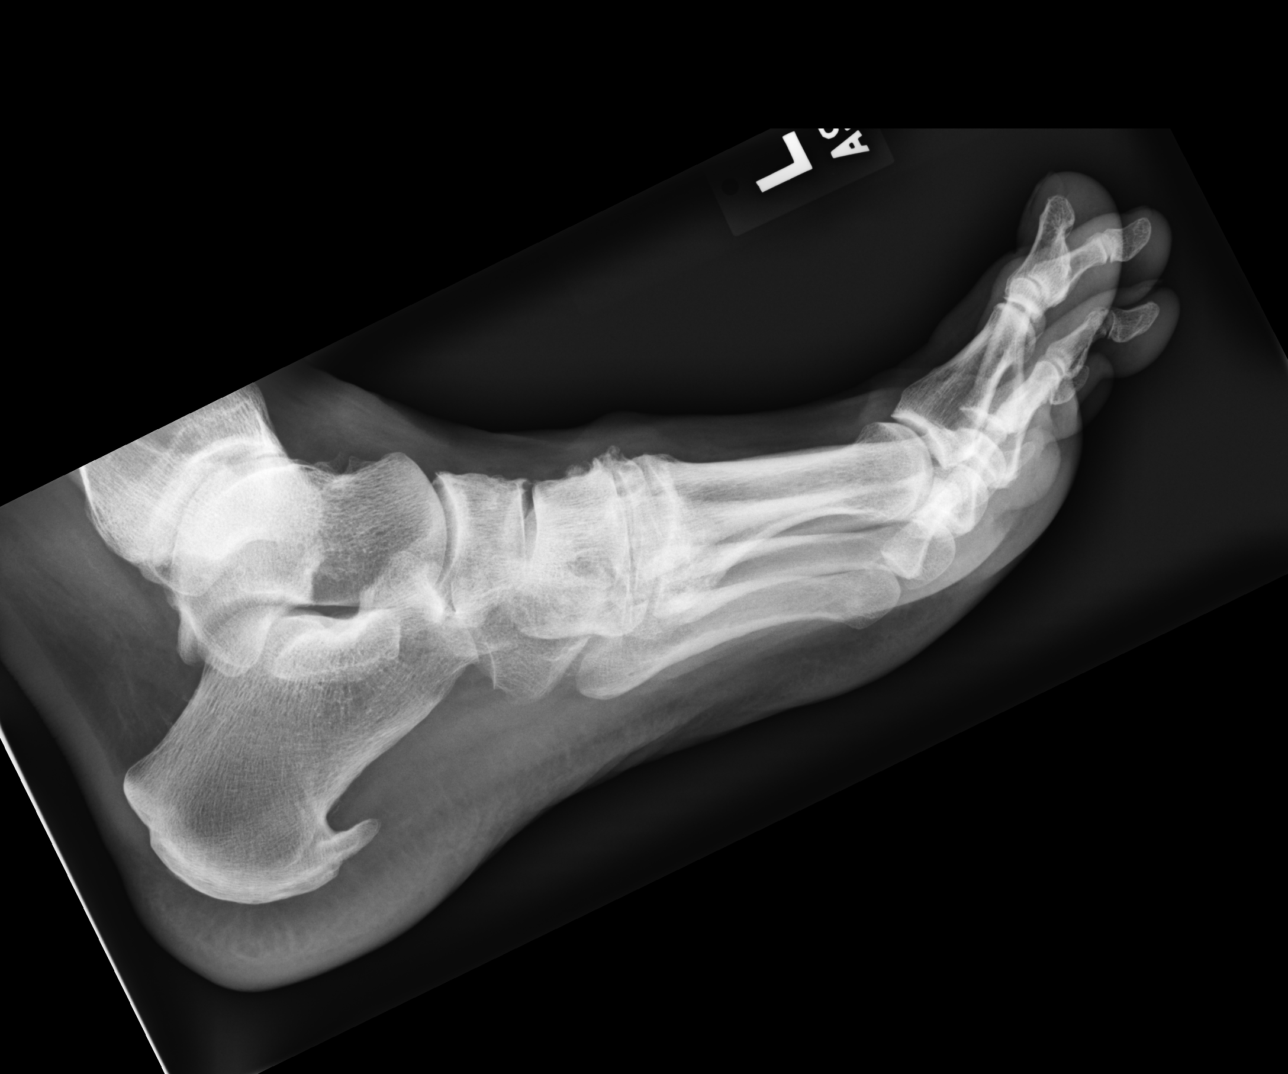

[3 of 3 positions shown; findings below may reference images not displayed]

FINDINGS: Short first metatarsal is again demonstrated. Moderate degenerative
changes at the first MTP joint appear relatively stable. There are
progressive midfoot degenerative changes most notably at the first
metatarsal articulation with the medial cuneiform. Joint space
narrowing, bony eburnation and subchondral cystic change noted. Mild
degenerative changes at the tibiotalar joint. Large calcaneal heel
spur.
IMPRESSION: Slight progression of degenerative changes at the tarsal metatarsal
joints.

Moderate stable degenerative changes at the first MTP joint.

Large calcaneal heel spur.

No acute bony findings.

## 2020-08-13 DIAGNOSIS — U071 COVID-19: Secondary | ICD-10-CM | POA: Diagnosis not present

## 2020-09-03 DIAGNOSIS — R058 Other specified cough: Secondary | ICD-10-CM | POA: Diagnosis not present

## 2020-09-07 DIAGNOSIS — M65312 Trigger thumb, left thumb: Secondary | ICD-10-CM | POA: Diagnosis not present

## 2020-09-21 DIAGNOSIS — K5792 Diverticulitis of intestine, part unspecified, without perforation or abscess without bleeding: Secondary | ICD-10-CM | POA: Diagnosis not present

## 2020-09-21 DIAGNOSIS — R1032 Left lower quadrant pain: Secondary | ICD-10-CM | POA: Diagnosis not present

## 2020-09-21 DIAGNOSIS — Z9049 Acquired absence of other specified parts of digestive tract: Secondary | ICD-10-CM | POA: Diagnosis not present

## 2020-09-24 DIAGNOSIS — U071 COVID-19: Secondary | ICD-10-CM | POA: Diagnosis not present

## 2020-09-24 DIAGNOSIS — K5732 Diverticulitis of large intestine without perforation or abscess without bleeding: Secondary | ICD-10-CM | POA: Diagnosis not present

## 2020-09-29 DIAGNOSIS — L738 Other specified follicular disorders: Secondary | ICD-10-CM | POA: Diagnosis not present

## 2020-09-29 DIAGNOSIS — D225 Melanocytic nevi of trunk: Secondary | ICD-10-CM | POA: Diagnosis not present

## 2020-09-29 DIAGNOSIS — L821 Other seborrheic keratosis: Secondary | ICD-10-CM | POA: Diagnosis not present

## 2020-09-29 DIAGNOSIS — L570 Actinic keratosis: Secondary | ICD-10-CM | POA: Diagnosis not present

## 2020-09-29 DIAGNOSIS — D2261 Melanocytic nevi of right upper limb, including shoulder: Secondary | ICD-10-CM | POA: Diagnosis not present

## 2020-09-29 DIAGNOSIS — B078 Other viral warts: Secondary | ICD-10-CM | POA: Diagnosis not present

## 2020-10-05 DIAGNOSIS — M65312 Trigger thumb, left thumb: Secondary | ICD-10-CM | POA: Diagnosis not present

## 2020-10-15 DIAGNOSIS — K5790 Diverticulosis of intestine, part unspecified, without perforation or abscess without bleeding: Secondary | ICD-10-CM | POA: Diagnosis not present

## 2020-10-15 DIAGNOSIS — I7090 Unspecified atherosclerosis: Secondary | ICD-10-CM | POA: Diagnosis not present

## 2020-10-15 DIAGNOSIS — I773 Arterial fibromuscular dysplasia: Secondary | ICD-10-CM | POA: Diagnosis not present

## 2020-10-15 DIAGNOSIS — Z125 Encounter for screening for malignant neoplasm of prostate: Secondary | ICD-10-CM | POA: Diagnosis not present

## 2020-10-15 DIAGNOSIS — Z23 Encounter for immunization: Secondary | ICD-10-CM | POA: Diagnosis not present

## 2020-10-15 DIAGNOSIS — N401 Enlarged prostate with lower urinary tract symptoms: Secondary | ICD-10-CM | POA: Diagnosis not present

## 2020-12-20 DIAGNOSIS — R002 Palpitations: Secondary | ICD-10-CM | POA: Diagnosis not present

## 2020-12-20 DIAGNOSIS — I10 Essential (primary) hypertension: Secondary | ICD-10-CM | POA: Diagnosis not present

## 2020-12-20 DIAGNOSIS — I7090 Unspecified atherosclerosis: Secondary | ICD-10-CM | POA: Diagnosis not present

## 2020-12-20 DIAGNOSIS — E785 Hyperlipidemia, unspecified: Secondary | ICD-10-CM | POA: Diagnosis not present

## 2021-02-09 DIAGNOSIS — N5201 Erectile dysfunction due to arterial insufficiency: Secondary | ICD-10-CM | POA: Diagnosis not present

## 2021-02-09 DIAGNOSIS — R351 Nocturia: Secondary | ICD-10-CM | POA: Diagnosis not present

## 2021-02-09 DIAGNOSIS — N401 Enlarged prostate with lower urinary tract symptoms: Secondary | ICD-10-CM | POA: Diagnosis not present

## 2021-03-22 DIAGNOSIS — Z0000 Encounter for general adult medical examination without abnormal findings: Secondary | ICD-10-CM | POA: Diagnosis not present

## 2021-03-22 DIAGNOSIS — I10 Essential (primary) hypertension: Secondary | ICD-10-CM | POA: Diagnosis not present

## 2021-03-22 DIAGNOSIS — I7090 Unspecified atherosclerosis: Secondary | ICD-10-CM | POA: Diagnosis not present

## 2021-03-22 DIAGNOSIS — B351 Tinea unguium: Secondary | ICD-10-CM | POA: Diagnosis not present

## 2021-03-22 DIAGNOSIS — E038 Other specified hypothyroidism: Secondary | ICD-10-CM | POA: Diagnosis not present

## 2021-05-22 DIAGNOSIS — K30 Functional dyspepsia: Secondary | ICD-10-CM | POA: Diagnosis not present

## 2021-05-22 DIAGNOSIS — R61 Generalized hyperhidrosis: Secondary | ICD-10-CM | POA: Diagnosis not present

## 2021-05-22 DIAGNOSIS — R079 Chest pain, unspecified: Secondary | ICD-10-CM | POA: Diagnosis not present

## 2021-05-22 DIAGNOSIS — R0789 Other chest pain: Secondary | ICD-10-CM | POA: Diagnosis not present

## 2021-05-24 DIAGNOSIS — E785 Hyperlipidemia, unspecified: Secondary | ICD-10-CM | POA: Diagnosis not present

## 2021-05-24 DIAGNOSIS — I773 Arterial fibromuscular dysplasia: Secondary | ICD-10-CM | POA: Diagnosis not present

## 2021-05-24 DIAGNOSIS — R0789 Other chest pain: Secondary | ICD-10-CM | POA: Diagnosis not present

## 2021-05-24 DIAGNOSIS — R0683 Snoring: Secondary | ICD-10-CM | POA: Diagnosis not present

## 2021-05-24 DIAGNOSIS — I10 Essential (primary) hypertension: Secondary | ICD-10-CM | POA: Diagnosis not present

## 2021-05-24 DIAGNOSIS — R079 Chest pain, unspecified: Secondary | ICD-10-CM | POA: Diagnosis not present

## 2021-06-10 DIAGNOSIS — D72824 Basophilia: Secondary | ICD-10-CM | POA: Diagnosis not present

## 2021-06-10 DIAGNOSIS — K219 Gastro-esophageal reflux disease without esophagitis: Secondary | ICD-10-CM | POA: Diagnosis not present

## 2021-08-03 DIAGNOSIS — R0681 Apnea, not elsewhere classified: Secondary | ICD-10-CM | POA: Diagnosis not present

## 2021-08-03 DIAGNOSIS — R0683 Snoring: Secondary | ICD-10-CM | POA: Diagnosis not present

## 2021-10-30 ENCOUNTER — Other Ambulatory Visit: Payer: Self-pay

## 2021-10-30 ENCOUNTER — Inpatient Hospital Stay (HOSPITAL_BASED_OUTPATIENT_CLINIC_OR_DEPARTMENT_OTHER)
Admission: EM | Admit: 2021-10-30 | Discharge: 2021-11-02 | DRG: 321 | Disposition: A | Discharge status: Home / Self Care | Payer: BC Managed Care – PPO | Attending: Cardiology | Admitting: Cardiology

## 2021-10-30 ENCOUNTER — Encounter (HOSPITAL_COMMUNITY): Payer: Self-pay

## 2021-10-30 ENCOUNTER — Emergency Department (HOSPITAL_BASED_OUTPATIENT_CLINIC_OR_DEPARTMENT_OTHER): Payer: BC Managed Care – PPO

## 2021-10-30 ENCOUNTER — Encounter (HOSPITAL_BASED_OUTPATIENT_CLINIC_OR_DEPARTMENT_OTHER): Payer: Self-pay

## 2021-10-30 ENCOUNTER — Encounter (HOSPITAL_COMMUNITY)
Admission: EM | Disposition: A | Discharge status: Home / Self Care | Payer: Self-pay | Source: Home / Self Care | Attending: Cardiology

## 2021-10-30 DIAGNOSIS — Z1152 Encounter for screening for COVID-19: Secondary | ICD-10-CM

## 2021-10-30 DIAGNOSIS — I3139 Other pericardial effusion (noninflammatory): Secondary | ICD-10-CM | POA: Diagnosis present

## 2021-10-30 DIAGNOSIS — E785 Hyperlipidemia, unspecified: Secondary | ICD-10-CM | POA: Diagnosis not present

## 2021-10-30 DIAGNOSIS — I2510 Atherosclerotic heart disease of native coronary artery without angina pectoris: Secondary | ICD-10-CM | POA: Diagnosis not present

## 2021-10-30 DIAGNOSIS — Z833 Family history of diabetes mellitus: Secondary | ICD-10-CM | POA: Diagnosis not present

## 2021-10-30 DIAGNOSIS — E876 Hypokalemia: Secondary | ICD-10-CM | POA: Diagnosis present

## 2021-10-30 DIAGNOSIS — I110 Hypertensive heart disease with heart failure: Secondary | ICD-10-CM | POA: Diagnosis present

## 2021-10-30 DIAGNOSIS — I25110 Atherosclerotic heart disease of native coronary artery with unstable angina pectoris: Secondary | ICD-10-CM | POA: Diagnosis present

## 2021-10-30 DIAGNOSIS — Z7982 Long term (current) use of aspirin: Secondary | ICD-10-CM | POA: Diagnosis not present

## 2021-10-30 DIAGNOSIS — R079 Chest pain, unspecified: Secondary | ICD-10-CM | POA: Diagnosis not present

## 2021-10-30 DIAGNOSIS — Z20822 Contact with and (suspected) exposure to covid-19: Secondary | ICD-10-CM | POA: Diagnosis not present

## 2021-10-30 DIAGNOSIS — R0789 Other chest pain: Secondary | ICD-10-CM | POA: Diagnosis not present

## 2021-10-30 DIAGNOSIS — Z955 Presence of coronary angioplasty implant and graft: Secondary | ICD-10-CM

## 2021-10-30 DIAGNOSIS — I5041 Acute combined systolic (congestive) and diastolic (congestive) heart failure: Secondary | ICD-10-CM | POA: Diagnosis present

## 2021-10-30 DIAGNOSIS — Z79890 Hormone replacement therapy (postmenopausal): Secondary | ICD-10-CM | POA: Diagnosis not present

## 2021-10-30 DIAGNOSIS — Z23 Encounter for immunization: Secondary | ICD-10-CM | POA: Diagnosis not present

## 2021-10-30 DIAGNOSIS — I773 Arterial fibromuscular dysplasia: Secondary | ICD-10-CM | POA: Diagnosis not present

## 2021-10-30 DIAGNOSIS — I708 Atherosclerosis of other arteries: Secondary | ICD-10-CM | POA: Diagnosis not present

## 2021-10-30 DIAGNOSIS — I10 Essential (primary) hypertension: Secondary | ICD-10-CM | POA: Diagnosis not present

## 2021-10-30 DIAGNOSIS — I255 Ischemic cardiomyopathy: Secondary | ICD-10-CM | POA: Diagnosis present

## 2021-10-30 DIAGNOSIS — Z79899 Other long term (current) drug therapy: Secondary | ICD-10-CM

## 2021-10-30 DIAGNOSIS — I214 Non-ST elevation (NSTEMI) myocardial infarction: Principal | ICD-10-CM | POA: Diagnosis present

## 2021-10-30 DIAGNOSIS — E039 Hypothyroidism, unspecified: Secondary | ICD-10-CM | POA: Diagnosis present

## 2021-10-30 DIAGNOSIS — I493 Ventricular premature depolarization: Secondary | ICD-10-CM | POA: Diagnosis not present

## 2021-10-30 DIAGNOSIS — E782 Mixed hyperlipidemia: Secondary | ICD-10-CM | POA: Clinically undetermined

## 2021-10-30 DIAGNOSIS — R110 Nausea: Secondary | ICD-10-CM | POA: Diagnosis not present

## 2021-10-30 DIAGNOSIS — Z8249 Family history of ischemic heart disease and other diseases of the circulatory system: Secondary | ICD-10-CM | POA: Diagnosis not present

## 2021-10-30 DIAGNOSIS — R0902 Hypoxemia: Secondary | ICD-10-CM | POA: Diagnosis not present

## 2021-10-30 DIAGNOSIS — I4891 Unspecified atrial fibrillation: Secondary | ICD-10-CM | POA: Diagnosis not present

## 2021-10-30 DIAGNOSIS — I480 Paroxysmal atrial fibrillation: Secondary | ICD-10-CM | POA: Diagnosis not present

## 2021-10-30 HISTORY — DX: Non-ST elevation (NSTEMI) myocardial infarction: I21.4

## 2021-10-30 HISTORY — PX: LEFT HEART CATH AND CORONARY ANGIOGRAPHY: CATH118249

## 2021-10-30 HISTORY — DX: Atherosclerotic heart disease of native coronary artery with unstable angina pectoris: I25.110

## 2021-10-30 HISTORY — PX: CORONARY/GRAFT ACUTE MI REVASCULARIZATION: CATH118305

## 2021-10-30 HISTORY — DX: Presence of coronary angioplasty implant and graft: Z95.5

## 2021-10-30 LAB — COMPREHENSIVE METABOLIC PANEL
ALT: 41 U/L (ref 0–44)
AST: 73 U/L — ABNORMAL HIGH (ref 15–41)
Albumin: 4.7 g/dL (ref 3.5–5.0)
Alkaline Phosphatase: 53 U/L (ref 38–126)
Anion gap: 14 (ref 5–15)
BUN: 18 mg/dL (ref 8–23)
CO2: 24 mmol/L (ref 22–32)
Calcium: 9.7 mg/dL (ref 8.9–10.3)
Chloride: 102 mmol/L (ref 98–111)
Creatinine, Ser: 1.17 mg/dL (ref 0.61–1.24)
GFR, Estimated: >60 mL/min (ref >60)
Glucose, Bld: 172 mg/dL — ABNORMAL HIGH (ref 70–99)
Potassium: 3.5 mmol/L (ref 3.5–5.1)
Sodium: 140 mmol/L (ref 135–145)
Total Bilirubin: 1.1 mg/dL (ref 0.3–1.2)
Total Protein: 7.7 g/dL (ref 6.5–8.1)

## 2021-10-30 LAB — CBC
HCT: 45.2 % (ref 39.0–52.0)
HCT: 43.8 % (ref 39.0–52.0)
Hemoglobin: 16.3 g/dL (ref 13.0–17.0)
Hemoglobin: 15.4 g/dL (ref 13.0–17.0)
MCH: 32 pg (ref 26.0–34.0)
MCH: 31.8 pg (ref 26.0–34.0)
MCHC: 36.1 g/dL — ABNORMAL HIGH (ref 30.0–36.0)
MCHC: 35.2 g/dL (ref 30.0–36.0)
MCV: 88.8 fL (ref 80.0–100.0)
MCV: 90.3 fL (ref 80.0–100.0)
Platelets: 170 10*3/uL (ref 150–400)
Platelets: 173 10*3/uL (ref 150–400)
RBC: 5.09 MIL/uL (ref 4.22–5.81)
RBC: 4.85 MIL/uL (ref 4.22–5.81)
RDW: 13.5 % (ref 11.5–15.5)
RDW: 13.5 % (ref 11.5–15.5)
WBC: 11.7 10*3/uL — ABNORMAL HIGH (ref 4.0–10.5)
WBC: 12.2 10*3/uL — ABNORMAL HIGH (ref 4.0–10.5)
nRBC: 0 % (ref 0.0–0.2)
nRBC: 0 % (ref 0.0–0.2)

## 2021-10-30 LAB — URINALYSIS, ROUTINE W REFLEX MICROSCOPIC
Bilirubin Urine: NEGATIVE
Glucose, UA: NEGATIVE mg/dL
Hgb urine dipstick: NEGATIVE
Ketones, ur: NEGATIVE mg/dL
Leukocytes,Ua: NEGATIVE
Nitrite: NEGATIVE
Protein, ur: NEGATIVE mg/dL
Specific Gravity, Urine: >1.046 — ABNORMAL HIGH (ref 1.005–1.030)
pH: 5 (ref 5.0–8.0)

## 2021-10-30 LAB — POCT ACTIVATED CLOTTING TIME
Activated Clotting Time: 263 seconds
Activated Clotting Time: 287 seconds
Activated Clotting Time: 588 seconds
Activated Clotting Time: 293 seconds

## 2021-10-30 LAB — CREATININE, SERUM
Creatinine, Ser: 1.13 mg/dL (ref 0.61–1.24)
GFR, Estimated: >60 mL/min (ref >60)

## 2021-10-30 LAB — RESP PANEL BY RT-PCR (FLU A&B, COVID) ARPGX2
Influenza A by PCR: NEGATIVE
Influenza B by PCR: NEGATIVE
SARS Coronavirus 2 by RT PCR: NEGATIVE

## 2021-10-30 LAB — MRSA NEXT GEN BY PCR, NASAL: MRSA by PCR Next Gen: NOT DETECTED

## 2021-10-30 LAB — TROPONIN I (HIGH SENSITIVITY)
Troponin I (High Sensitivity): 3857 ng/L (ref <18)
Troponin I (High Sensitivity): 7362 ng/L (ref <18)
Troponin I (High Sensitivity): 10586 ng/L (ref <18)
Troponin I (High Sensitivity): >24000 ng/L (ref <18)

## 2021-10-30 LAB — LIPASE, BLOOD: Lipase: <10 U/L — ABNORMAL LOW (ref 11–51)

## 2021-10-30 SURGERY — LEFT HEART CATH AND CORONARY ANGIOGRAPHY
Anesthesia: LOCAL

## 2021-10-30 MED ORDER — MIDAZOLAM HCL 2 MG/2ML IJ SOLN
INTRAMUSCULAR | Status: AC
  Filled 2021-10-30: qty 2

## 2021-10-30 MED ORDER — TIROFIBAN (AGGRASTAT) BOLUS VIA INFUSION
INTRAVENOUS | Status: DC | PRN
  Administered 2021-10-30: 2552.5 ug via INTRAVENOUS

## 2021-10-30 MED ORDER — NITROGLYCERIN 1 MG/10 ML FOR IR/CATH LAB
INTRA_ARTERIAL | Status: DC | PRN
  Administered 2021-10-30: 200 ug via INTRACORONARY

## 2021-10-30 MED ORDER — MIDAZOLAM HCL 2 MG/2ML IJ SOLN
INTRAMUSCULAR | Status: DC | PRN
  Administered 2021-10-30: 1 mg via INTRAVENOUS

## 2021-10-30 MED ORDER — FENTANYL CITRATE (PF) 100 MCG/2ML IJ SOLN
INTRAMUSCULAR | Status: DC | PRN
  Administered 2021-10-30: 25 ug via INTRAVENOUS

## 2021-10-30 MED ORDER — LIDOCAINE HCL (PF) 1 % IJ SOLN
INTRAMUSCULAR | Status: AC
  Filled 2021-10-30: qty 30

## 2021-10-30 MED ORDER — VERAPAMIL HCL 2.5 MG/ML IV SOLN
INTRAVENOUS | Status: DC | PRN
  Administered 2021-10-30: 100 ug via INTRACORONARY
  Administered 2021-10-30 (×2): 200 ug via INTRACORONARY

## 2021-10-30 MED ORDER — SODIUM CHLORIDE 0.9 % IV SOLN: INTRAVENOUS | Status: AC

## 2021-10-30 MED ORDER — ASPIRIN 325 MG PO TABS
325.0000 mg | ORAL_TABLET | Freq: Every day | ORAL | Status: DC
  Administered 2021-10-30: 325 mg via ORAL
  Filled 2021-10-30: qty 1

## 2021-10-30 MED ORDER — ASPIRIN 81 MG PO CHEW
81.0000 mg | CHEWABLE_TABLET | Freq: Every day | ORAL | Status: DC
  Administered 2021-10-31 – 2021-11-02 (×3): 81 mg via ORAL
  Filled 2021-10-30 (×3): qty 1

## 2021-10-30 MED ORDER — SODIUM CHLORIDE 0.9% FLUSH: 3.0000 mL | INTRAVENOUS | Status: DC | PRN

## 2021-10-30 MED ORDER — TIROFIBAN HCL IN NACL 5-0.9 MG/100ML-% IV SOLN
INTRAVENOUS | Status: AC
  Filled 2021-10-30: qty 100

## 2021-10-30 MED ORDER — HEPARIN SODIUM (PORCINE) 1000 UNIT/ML IJ SOLN
INTRAMUSCULAR | Status: AC
  Filled 2021-10-30: qty 10

## 2021-10-30 MED ORDER — ONDANSETRON HCL 4 MG/2ML IJ SOLN
4.0000 mg | Freq: Four times a day (QID) | INTRAMUSCULAR | Status: DC | PRN
  Administered 2021-10-30: 4 mg via INTRAVENOUS
  Filled 2021-10-30: qty 2

## 2021-10-30 MED ORDER — NITROGLYCERIN IN D5W 200-5 MCG/ML-% IV SOLN
0.0000 ug/min | INTRAVENOUS | Status: DC
  Administered 2021-10-30: 5 ug/min via INTRAVENOUS
  Filled 2021-10-30: qty 250

## 2021-10-30 MED ORDER — IOHEXOL 350 MG/ML SOLN
100.0000 mL | Freq: Once | INTRAVENOUS | Status: AC | PRN
  Administered 2021-10-30: 100 mL via INTRAVENOUS

## 2021-10-30 MED ORDER — HEPARIN (PORCINE) 25000 UT/250ML-% IV SOLN
1300.0000 [IU]/h | INTRAVENOUS | Status: DC
  Administered 2021-10-30: 1300 [IU]/h via INTRAVENOUS
  Filled 2021-10-30: qty 250

## 2021-10-30 MED ORDER — ONDANSETRON HCL 4 MG/2ML IJ SOLN
INTRAMUSCULAR | Status: AC
  Filled 2021-10-30: qty 2

## 2021-10-30 MED ORDER — IOHEXOL 350 MG/ML SOLN
INTRAVENOUS | Status: DC | PRN
  Administered 2021-10-30: 250 mL

## 2021-10-30 MED ORDER — TICAGRELOR 90 MG PO TABS
ORAL_TABLET | ORAL | Status: DC | PRN
  Administered 2021-10-30: 180 mg via ORAL

## 2021-10-30 MED ORDER — HEPARIN (PORCINE) IN NACL 1000-0.9 UT/500ML-% IV SOLN
INTRAVENOUS | Status: DC | PRN
  Administered 2021-10-30 (×2): 500 mL

## 2021-10-30 MED ORDER — VERAPAMIL HCL 2.5 MG/ML IV SOLN
INTRAVENOUS | Status: DC | PRN
  Administered 2021-10-30: 10 mL via INTRA_ARTERIAL

## 2021-10-30 MED ORDER — OXYCODONE HCL 5 MG PO TABS: 5.0000 mg | ORAL_TABLET | ORAL | Status: DC | PRN

## 2021-10-30 MED ORDER — NITROGLYCERIN 0.4 MG SL SUBL
0.4000 mg | SUBLINGUAL_TABLET | SUBLINGUAL | Status: DC | PRN
  Administered 2021-10-30 (×2): 0.4 mg via SUBLINGUAL
  Filled 2021-10-30: qty 1

## 2021-10-30 MED ORDER — ACETAMINOPHEN 325 MG PO TABS: 650.0000 mg | ORAL_TABLET | ORAL | Status: DC | PRN

## 2021-10-30 MED ORDER — TICAGRELOR 90 MG PO TABS
90.0000 mg | ORAL_TABLET | Freq: Two times a day (BID) | ORAL | Status: DC
  Administered 2021-10-31 – 2021-11-01 (×3): 90 mg via ORAL
  Filled 2021-10-30 (×4): qty 1

## 2021-10-30 MED ORDER — TIROFIBAN HCL IN NACL 5-0.9 MG/100ML-% IV SOLN: 0.1500 ug/kg/min | INTRAVENOUS | Status: DC

## 2021-10-30 MED ORDER — VERAPAMIL HCL 2.5 MG/ML IV SOLN
INTRAVENOUS | Status: AC
  Filled 2021-10-30: qty 2

## 2021-10-30 MED ORDER — LABETALOL HCL 5 MG/ML IV SOLN: 10.0000 mg | INTRAVENOUS | Status: AC | PRN

## 2021-10-30 MED ORDER — HEPARIN SODIUM (PORCINE) 5000 UNIT/ML IJ SOLN
5000.0000 [IU] | Freq: Three times a day (TID) | INTRAMUSCULAR | Status: DC
  Administered 2021-10-31 – 2021-11-01 (×5): 5000 [IU] via SUBCUTANEOUS
  Filled 2021-10-30 (×5): qty 1

## 2021-10-30 MED ORDER — HEPARIN (PORCINE) IN NACL 1000-0.9 UT/500ML-% IV SOLN
INTRAVENOUS | Status: AC
  Filled 2021-10-30: qty 1000

## 2021-10-30 MED ORDER — FAMOTIDINE IN NACL 20-0.9 MG/50ML-% IV SOLN
20.0000 mg | Freq: Once | INTRAVENOUS | Status: AC
  Administered 2021-10-30: 20 mg via INTRAVENOUS
  Filled 2021-10-30: qty 50

## 2021-10-30 MED ORDER — TICAGRELOR 90 MG PO TABS
ORAL_TABLET | ORAL | Status: AC
  Filled 2021-10-30: qty 2

## 2021-10-30 MED ORDER — ASPIRIN 81 MG PO TBEC
81.0000 mg | DELAYED_RELEASE_TABLET | Freq: Every day | ORAL | Status: DC
  Filled 2021-10-30: qty 1

## 2021-10-30 MED ORDER — LIDOCAINE HCL (PF) 1 % IJ SOLN
INTRAMUSCULAR | Status: DC | PRN
  Administered 2021-10-30: 2 mL

## 2021-10-30 MED ORDER — TIROFIBAN HCL IN NACL 5-0.9 MG/100ML-% IV SOLN
INTRAVENOUS | Status: AC | PRN
  Administered 2021-10-30: .15 ug/kg/min via INTRAVENOUS

## 2021-10-30 MED ORDER — HYDRALAZINE HCL 20 MG/ML IJ SOLN: 10.0000 mg | INTRAMUSCULAR | Status: AC | PRN

## 2021-10-30 MED ORDER — SODIUM CHLORIDE 0.9 % IV SOLN
INTRAVENOUS | Status: AC | PRN
  Administered 2021-10-30: 20 mL/h via INTRAVENOUS

## 2021-10-30 MED ORDER — SODIUM CHLORIDE 0.9 % IV SOLN: 250.0000 mL | INTRAVENOUS | Status: DC | PRN

## 2021-10-30 MED ORDER — FENTANYL CITRATE PF 50 MCG/ML IJ SOSY
50.0000 ug | PREFILLED_SYRINGE | Freq: Once | INTRAMUSCULAR | Status: AC
  Administered 2021-10-30: 50 ug via INTRAVENOUS
  Filled 2021-10-30: qty 1

## 2021-10-30 MED ORDER — SODIUM CHLORIDE 0.9% FLUSH
3.0000 mL | Freq: Two times a day (BID) | INTRAVENOUS | Status: DC
  Administered 2021-10-30 – 2021-11-02 (×6): 3 mL via INTRAVENOUS

## 2021-10-30 MED ORDER — LACTATED RINGERS IV BOLUS
1000.0000 mL | Freq: Once | INTRAVENOUS | Status: AC
  Administered 2021-10-30: 1000 mL via INTRAVENOUS

## 2021-10-30 MED ORDER — FENTANYL CITRATE (PF) 100 MCG/2ML IJ SOLN
INTRAMUSCULAR | Status: AC
  Filled 2021-10-30: qty 2

## 2021-10-30 MED ORDER — HEPARIN BOLUS VIA INFUSION
4000.0000 [IU] | Freq: Once | INTRAVENOUS | Status: AC
  Administered 2021-10-30: 4000 [IU] via INTRAVENOUS

## 2021-10-30 MED ORDER — ONDANSETRON HCL 4 MG/2ML IJ SOLN
4.0000 mg | Freq: Once | INTRAMUSCULAR | Status: AC
  Administered 2021-10-30: 4 mg via INTRAVENOUS

## 2021-10-30 MED ORDER — HEPARIN SODIUM (PORCINE) 1000 UNIT/ML IJ SOLN
INTRAMUSCULAR | Status: DC | PRN
  Administered 2021-10-30: 5000 [IU] via INTRAVENOUS
  Administered 2021-10-30: 3000 [IU] via INTRAVENOUS
  Administered 2021-10-30: 5000 [IU] via INTRAVENOUS
  Administered 2021-10-30: 2000 [IU] via INTRAVENOUS

## 2021-10-30 MED ORDER — NITROGLYCERIN 1 MG/10 ML FOR IR/CATH LAB
INTRA_ARTERIAL | Status: AC
  Filled 2021-10-30: qty 10

## 2021-10-30 SURGICAL SUPPLY — 18 items
BALLN SAPPHIRE 2.5X15 (BALLOONS) ×1
BALLOON SAPPHIRE 2.5X15 (BALLOONS) IMPLANT
CATH 5FR JL3.5 JR4 ANG PIG MP (CATHETERS) IMPLANT
CATH VISTA GUIDE 6FR XB3.5 (CATHETERS) IMPLANT
DEVICE RAD COMP TR BAND LRG (VASCULAR PRODUCTS) IMPLANT
ELECT DEFIB PAD ADLT CADENCE (PAD) IMPLANT
GLIDESHEATH SLEND A-KIT 6F 22G (SHEATH) IMPLANT
GUIDEWIRE INQWIRE 1.5J.035X260 (WIRE) IMPLANT
INQWIRE 1.5J .035X260CM (WIRE) ×1
KIT ENCORE 26 ADVANTAGE (KITS) IMPLANT
KIT HEART LEFT (KITS) ×1 IMPLANT
PACK CARDIAC CATHETERIZATION (CUSTOM PROCEDURE TRAY) ×1 IMPLANT
SHEATH PROBE COVER 6X72 (BAG) IMPLANT
STENT SYNERGY XD 3.0X16 (Permanent Stent) IMPLANT
SYNERGY XD 3.0X16 (Permanent Stent) ×1 IMPLANT
TRANSDUCER W/STOPCOCK (MISCELLANEOUS) ×1 IMPLANT
TUBING CIL FLEX 10 FLL-RA (TUBING) ×1 IMPLANT
WIRE ASAHI PROWATER 180CM (WIRE) IMPLANT

## 2021-10-30 NOTE — ED Notes (Signed)
Cath lab member notified that patient was on his way

## 2021-10-30 NOTE — ED Notes (Signed)
This RN received a call from [facility] and was informed that they did not have a truck readily available to transport this patient to [facility] cathlab.[facility] contacted for transport.

## 2021-10-30 NOTE — CV Procedure (Signed)
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  Read due to stenosis to 0% and converted TIMI grade I flow to TIMI grade II flow.  Multiple doses of IC verapamil was given for total of 400 mcg.  Attempted not to jailed the large second diagonal, and was successful.  Final stent size in proximal to mid LAD 3.0 x 16 Synergy. Apical hypokinesis/dyskinesis. Other vessels are normal.

## 2021-10-30 NOTE — ED Triage Notes (Addendum)
Reports late last night started with a little diarrhea and reports then it turned in to projectile vomiting.  Reports epigastric/chest pain.  Pain does not radiating anywhere.  Wife reports his abd is distended.  Patient reports gurgling in his chest. Wife reports hx of diverticulitis with incarcerated hernia and bowel resection.  Wife reports he has been burping a lot.  Patient still having nausea.

## 2021-10-30 NOTE — ED Provider Notes (Addendum)
[facility] EMERGENCY DEPT Provider Note   CSN: [id number] Arrival date & time: 10/30/21  1017     History  Chief Complaint  Patient presents with   Emesis    Alex Arias is a 66 y.o. male.   Emesis Associated symptoms: diarrhea      66 year old male with medical history significant for right inguinal hernia, status post laparoscopy and repair due to incarceration with associated small bowel resection with Dr. Grandville Silos who presents to the emergency department with epigastric abdominal pain, nausea, vomiting, diarrhea for the past 12 hours.  The patient states that he developed nausea and vomiting and then developed severe epigastric abdominal pain that radiates to his back.  He now endorses a dull back pain that is persistent with associated radiation to the front of his chest.  He endorses persistent nausea.  Denies any significant cardiac history.  He denies any fevers or chills.  He denies any cough or shortness of breath.  Home Medications Prior to Admission medications   Medication Sig Start Date End Date Taking? Authorizing Provider  aspirin 325 MG EC tablet Take 325 mg by mouth every 6 (six) hours as needed for pain.    [provider]  bismuth subsalicylate (PEPTO BISMOL) 262 MG/15ML suspension Take 15 mLs by mouth every 6 (six) hours as needed. For stomach upset     [provider]  ciprofloxacin (CIPRO) 500 MG tablet Take 1 tablet (500 mg total) by mouth 2 (two) times daily. 05/23/19   Eustaquio Maize, PA-C  levothyroxine (SYNTHROID) 88 MCG tablet Take 88 mcg by mouth daily. 05/12/19   [provider]  Multiple Vitamin (MULITIVITAMIN WITH MINERALS) TABS Take 1 tablet by mouth daily.      [provider]  Multiple Vitamin (MULTI-VITAMIN) tablet Take 1 tablet by mouth daily.    [provider]  olmesartan-hydrochlorothiazide (BENICAR HCT) 20-12.5 MG tablet Take 1 tablet by mouth daily. 03/18/19   [provider]  ondansetron (ZOFRAN ODT) 4 MG disintegrating tablet Take 1 tablet (4 mg total) by mouth every 8 (eight) hours as needed for nausea or vomiting. 05/23/19   Eustaquio Maize, PA-C      Allergies    Patient has no known allergies.    Review of Systems   Review of Systems  Cardiovascular:  Positive for chest pain.  Gastrointestinal:  Positive for diarrhea, nausea and vomiting.  Musculoskeletal:  Positive for back pain.  All other systems reviewed and are negative.   Physical Exam Updated Vital Signs BP 129/82   Pulse 64   Temp 98.4 F (36.9 C) (Oral)   Resp 20   Ht 6\' 4"  (1.93 m)   Wt 102.1 kg   SpO2 100%   BMI 27.39 kg/m  Physical Exam Vitals and nursing note reviewed.  Constitutional:      General: He is not in acute distress.    Appearance: He is well-developed.     Comments: Uncomfortable appearing, not diaphoretic  HENT:     Head: Normocephalic and atraumatic.  Eyes:     Conjunctiva/sclera: Conjunctivae normal.  Cardiovascular:     Rate and Rhythm: Normal rate and regular rhythm.  Pulmonary:     Effort: Pulmonary effort is normal. No respiratory distress.     Breath sounds: Normal breath sounds.  Abdominal:     Palpations: Abdomen is soft.     Tenderness: There is no abdominal tenderness. There is no guarding or rebound.  Musculoskeletal:  General: No swelling.     Cervical back: Neck supple.  Skin:    General: Skin is warm and dry.     Capillary Refill: Capillary refill takes less than 2 seconds.  Neurological:     Mental Status: He is alert.  Psychiatric:        Mood and Affect: Mood normal.     ED Results / Procedures / Treatments   Labs (all labs ordered are listed, but only abnormal results are displayed) Labs Reviewed  LIPASE, BLOOD - Abnormal; Notable for the following components:      Result Value   Lipase <10 (*)    All other components within normal limits  COMPREHENSIVE METABOLIC PANEL - Abnormal; Notable for the following  components:   Glucose, Bld 172 (*)    AST 73 (*)    All other components within normal limits  CBC - Abnormal; Notable for the following components:   WBC 11.7 (*)    MCHC 36.1 (*)    All other components within normal limits  TROPONIN I (HIGH SENSITIVITY) - Abnormal; Notable for the following components:   Troponin I (High Sensitivity) 3,857 (*)    All other components within normal limits  TROPONIN I (HIGH SENSITIVITY) - Abnormal; Notable for the following components:   Troponin I (High Sensitivity) 7,362 (*)    All other components within normal limits  TROPONIN I (HIGH SENSITIVITY) - Abnormal; Notable for the following components:   Troponin I (High Sensitivity) 10,586 (*)    All other components within normal limits  RESP PANEL BY RT-PCR (FLU A&B, COVID) ARPGX2  URINALYSIS, ROUTINE W REFLEX MICROSCOPIC  HEPARIN LEVEL (UNFRACTIONATED)    EKG None  Radiology CT Angio Chest/Abd/Pel for Dissection W and/or Wo Contrast  Result Date: 10/30/2021 CLINICAL DATA:  Patient reports some diarrhea last night, then developing projectile vomiting. Epigastric and chest pain, nonradiating. Distended abdomen. EXAM: CT ANGIOGRAPHY CHEST, ABDOMEN AND PELVIS TECHNIQUE: Multidetector CT imaging through the chest, abdomen and pelvis was performed using the standard protocol during bolus administration of intravenous contrast. Multiplanar reconstructed images and MIPs were obtained and reviewed to evaluate the vascular anatomy. RADIATION DOSE REDUCTION: This exam was performed according to the departmental dose-optimization program which includes automated exposure control, adjustment of the mA and/or kV according to patient size and/or use of iterative reconstruction technique. CONTRAST:  144mL OMNIPAQUE IOHEXOL 350 MG/ML SOLN COMPARISON:  09/24/2020 and 05/23/2019, CT abdomen and pelvis. FINDINGS: CTA CHEST FINDINGS Cardiovascular: Thoracic aorta is normal in caliber. No dissection. Mild atherosclerosis.  Arch branch vessels are widely patent. Heart is normal in size and configuration. Mild left and right coronary artery calcifications. No pericardial effusion. Mediastinum/Nodes: No enlarged mediastinal, hilar, or axillary lymph nodes. Thyroid gland, trachea, and esophagus demonstrate no significant findings. Lungs/Pleura: Lungs are clear. No pleural effusion or pneumothorax. Musculoskeletal: No fracture or acute finding. No bone lesion. No chest wall mass. Review of the MIP images confirms the above findings. CTA ABDOMEN AND PELVIS FINDINGS VASCULAR Aorta: Atherosclerosis with no significant stenosis. No dissection. No evidence of a penetrating ulcer or mural hematoma. Celiac: Patent without evidence of aneurysm, dissection, vasculitis or significant stenosis. SMA: Mild atherosclerotic plaque at the origin, without significant stenosis. No aneurysm or dissection. Renals: Both renal arteries are patent without evidence of aneurysm, dissection, vasculitis, fibromuscular dysplasia or significant stenosis. IMA: Patent without evidence of aneurysm, dissection, vasculitis or significant stenosis. Inflow: Patent without evidence of aneurysm, dissection, vasculitis or significant stenosis. Veins: No obvious venous abnormality within  the limitations of this arterial phase study. Review of the MIP images confirms the above findings. NON-VASCULAR Hepatobiliary: No focal liver abnormality is seen. No gallstones, gallbladder wall thickening, or biliary dilatation. Pancreas: Unremarkable. No pancreatic ductal dilatation or surrounding inflammatory changes. Spleen: Normal in size without focal abnormality. Adrenals/Urinary Tract: No adrenal mass. Mild renal cortical thinning. 1 cm low-density mass, posterior upper pole the left kidney, consistent with a cyst and unchanged, no follow-up recommended. No other renal masses, no stones and no hydronephrosis. Normal ureters. Normal bladder. Stomach/Bowel: Moderately distended stomach,  otherwise unremarkable. Small bowel and colon are normal in caliber. No wall thickening. No inflammation. Numerous colonic diverticula without diverticulitis. Normal appendix visualized. Lymphatic: No enlarged lymph nodes. Reproductive: Mildly enlarged prostate, 5.2 x 4.2 cm transversely. Other: Small fat containing umbilical hernia.  No ascites. Musculoskeletal: No fracture.  No bone lesion. Review of the MIP images confirms the above findings. IMPRESSION: CTA 1. No aortic aneurysm or dissection. No findings of acute aortic syndrome. 2. Aortic branch vessels are patent, with no hemodynamically significant stenosis. NON CTA 1. No acute findings within the chest, abdomen or pelvis. 2. Mild left and right coronary artery calcifications. Electronically Signed   By: Lajean Manes M.D.   On: 10/30/2021 12:11    Procedures .Critical Care  Performed by: Regan Lemming, MD Authorized by: Regan Lemming, MD   Critical care provider statement:    Critical care time (minutes):  94   Critical care was time spent personally by me on the following activities:  Development of treatment plan with patient or surrogate, discussions with consultants, evaluation of patient's response to treatment, examination of patient, ordering and review of laboratory studies, ordering and review of radiographic studies, ordering and performing treatments and interventions, pulse oximetry, re-evaluation of patient's condition and review of old charts   Care discussed with: admitting provider       Medications Ordered in ED Medications  ondansetron (ZOFRAN) 4 MG/2ML injection (  Not Given 10/30/21 1127)  aspirin tablet 325 mg ( Oral Automatically Held 11/07/21 1000)  nitroGLYCERIN (NITROSTAT) SL tablet 0.4 mg ( Sublingual MAR Hold 10/30/21 1511)  heparin ADULT infusion 100 units/mL (25000 units/259mL) (1,300 Units/hr Intravenous New Bag/Given 10/30/21 1205)  nitroGLYCERIN 50 mg in dextrose 5 % 250 mL (0.2 mg/mL) infusion (5  mcg/min Intravenous New Bag/Given 10/30/21 1400)  Heparin (Porcine) in NaCl 1000-0.9 UT/500ML-% SOLN (500 mLs  Given 10/30/21 1530)  fentaNYL (SUBLIMAZE) injection (25 mcg Intravenous Given 10/30/21 1532)  midazolam (VERSED) injection (1 mg Intravenous Given 10/30/21 1532)  lidocaine (PF) (XYLOCAINE) 1 % injection (2 mLs Infiltration Given 10/30/21 1532)  Radial Cocktail/Verapamil only (10 mLs Intra-arterial Given 10/30/21 1534)  heparin sodium (porcine) injection (5,000 Units Intravenous Given 10/30/21 1537)  ondansetron (ZOFRAN) injection 4 mg (4 mg Intravenous Given 10/30/21 1039)  fentaNYL (SUBLIMAZE) injection 50 mcg (50 mcg Intravenous Given 10/30/21 1109)  lactated ringers bolus 1,000 mL (0 mLs Intravenous Stopped 10/30/21 1207)  famotidine (PEPCID) IVPB 20 mg premix (0 mg Intravenous Stopped 10/30/21 1207)  iohexol (OMNIPAQUE) 350 MG/ML injection 100 mL (100 mLs Intravenous Contrast Given 10/30/21 1150)  heparin bolus via infusion 4,000 Units (4,000 Units Intravenous Bolus from Bag 10/30/21 1205)    ED Course/ Medical Decision Making/ A&P Clinical Course as of 10/30/21 1537  Sun Oct 30, 2021  1126 Troponin I (High Sensitivity)(!!): 3,857 [JL]  1339 Troponin I (High Sensitivity)(!!): 7,062 [JL]    Clinical Course User Index [JL] Regan Lemming, MD  Medical Decision Making Amount and/or Complexity of Data Reviewed Labs: ordered. Decision-making details documented in ED Course. Radiology: ordered.  Risk OTC drugs. Prescription drug management. Decision regarding hospitalization.    66 year old male with medical history significant for right inguinal hernia, status post laparoscopy and repair due to incarceration with associated small bowel resection with Dr. Grandville Silos who presents to the emergency department with epigastric abdominal pain, nausea, vomiting, diarrhea for the past 12 hours.  The patient states that he developed nausea and vomiting and  then developed severe epigastric abdominal pain that radiates to his back.  He now endorses a dull back pain that is persistent with associated radiation to the front of his chest.  He endorses persistent nausea.  Denies any significant cardiac history.  He denies any fevers or chills.  He denies any cough or shortness of breath.  On arrival, the patient was afebrile, not tachycardic or tachypneic, mildly hypertensive BP 145/85, saturating 100% on room air.  Sinus rhythm noted on cardiac telemetry.  Physical exam generally unremarkable with the exception of a Generally uncomfortable male with no diaphoresis, lungs clear to auscultation bilaterally, no significant abdominal tenderness.  Differential diagnosis includes ACS, PE, aortic dissection, Boerhaave syndrome, gastroenteritis, less likely but considered SBO.  Patient's EKG initially in triage revealed evidence of ST segment changes in the anterior leads and ST segment depressions inferiorly. Attempted to reach the STEMI physician oncall but did not receive immediate call back so paged Dr. Quentin Ore on call cardiology. Did discuss this EKG with on-call cardiology, Dr. Quentin Ore who agreed no STEMI.  The patient's initial troponin resulted elevated greater than 3700 concerning for ACS.  Additionally considered aortic dissection given the patient's chest pain radiating to the back.  CT angiogram for aortic dissection was ordered and results were negative for dissection: IMPRESSION:  CTA    1. No aortic aneurysm or dissection. No findings of acute aortic  syndrome.  2. Aortic branch vessels are patent, with no hemodynamically  significant stenosis.    NON CTA    1. No acute findings within the chest, abdomen or pelvis.  2. Mild left and right coronary artery calcifications.    The patient was administered an aspirin 325 mg in addition to 2 sublingual nitroglycerin.  He continued to have ongoing pain and discomfort.  After discussion with  cardiology, plan was admission for NSTEMI, however the patient continued to have back pain.  His repeat troponin was notably climbing greater than 7300.  I informed the patient of his diagnosis of likely MI/ACS.  I the patient was initially admitted to the hospitalist service for NSTEMI however, given the patient's ongoing chest discomfort, the patient was started on nitroglycerin gtt. with ongoing discomfort, cardiology was re-engaged and the cardiac Cath Lab was subsequently activated.  The patient was continued on a heparin gtt. following ruling out of dissection.  Of note, there was an issue with EKGs being transmitted to [facility] and IT was called, unfortunately there is no tech available on the weekend to resolve the issue.  EMS was then called and the patient was then transferred emergently to the Cath Lab.  On repeat assessment, the patient's pain was improved but still he remains symptomatic.  He remained hemodynamically stable at time of transfer.  Final Clinical Impression(s) / ED Diagnoses Final diagnoses:  NSTEMI (non-ST elevated myocardial infarction) ([place])    Rx / DC Orders ED Discharge Orders     None  Regan Lemming, MD 10/30/21 1538    Regan Lemming, MD 10/30/21 [id number]    Regan Lemming, MD 10/30/21 1820

## 2021-10-30 NOTE — H&P (Signed)
Awakened at 2 AM with nausea, vomiting, and diarrhea, has not felt well since then.  Realized that he was having a mild discomfort in his chest that he grades as 3/10.  When the discomfort did not resolve, he went to the [facility] emergency room where delta troponins were elevated and the EKG has some ST abnormality suggesting ischemia involving the V1 and V2 with inferior ST depression.  No history of diabetes, hypertension, tobacco use.  Does have history of fibromuscular dysplasia which could put him at increased risk for SCAD.  Family history CAD.  Vital signs are normal.  He does not appear ill.  Skin color is normal without cyanosis or pallor.  Pulses are 2+ and symmetric in the radial and posterior tibial bilaterally.  No pulsatile mass is noted in the abdomen.  Cardiac auscultation reveals no murmur.  ECG as noted above.  Overall impression is non-ST elevation MI with ongoing pain with suspicion of total occlusion not being recognized electrocardiographically.  In Cath Lab he continues to complain of ongoing mild left parasternal discomfort radiating straight through into his back.  It is about the same intensity now as it was when it started.   Critical care time: 35 minutes.

## 2021-10-30 NOTE — Progress Notes (Signed)
ANTICOAGULATION CONSULT NOTE - Initial Consult  Pharmacy Consult for heparin Indication: chest pain/ACS  No Known Allergies  Patient Measurements: Height: 6\' 4"  (193 cm) Weight: 102.1 kg (225 lb) IBW/kg (Calculated) : 86.8 Heparin Dosing Weight: TBW  Vital Signs: Temp: 98 F (36.7 C) (10/15 1025) BP: 136/83 (10/15 1200) Pulse Rate: 93 (10/15 1200)  Labs: Recent Labs    10/30/21 1035  HGB 16.3  HCT 45.2  PLT 170  CREATININE 1.17  TROPONINIHS 3,857*    Estimated Creatinine Clearance: 74.2 mL/min (by C-G formula based on SCr of 1.17 mg/dL).   Medical History: Past Medical History:  Diagnosis Date   Abdominal distension    Abdominal pain    Cough    Family history of diabetes mellitus    Itching    Pleurisy    Right inguinal hernia    Unspecified viral infection, in conditions classified elsewhere and of unspecified site    viral syndrome - per notes from [facility] dated 01/03/11    Assessment: 66 YOM presenting with CP/epigastric pain, elevated troponin, he is not on anticoagulation PTA, CBC wnl  Goal of Therapy:  Heparin level 0.3-0.7 units/ml Monitor platelets by anticoagulation protocol: Yes   Plan:  Heparin 4000 units IV x 1, and gtt at 1300 units/hr F/u 6 hour heparin level F/u cards eval and recs  Bertis Ruddy, PharmD Clinical Pharmacist ED Pharmacist Phone # [phone] 10/30/2021 12:09 PM

## 2021-10-30 NOTE — Progress Notes (Signed)
Plan of Care Note for accepted transfer   Patient: Alex Arias MRN: [id number]   DOA: 10/30/2021  Facility requesting transfer: [facility] Requesting Provider: Dr. Armandina Gemma  Reason for transfer: chest pain/ACS  Facility course: 66 year old male with history of bilateral inguinal hernias with incarcerated right hernia, HTN, fibromuscular dysplasia of renal artery who presented with chest pain. It started out last night with N/V/D and then had epigastric pain that radiated to his back. Still having ongoing chest pain.   Vitals: stable Pertinent labs: AST: 73, troponin 3857>pending  CTA chest: no dissection. No acute findings within the chest, abdomen, pelvis. Mild left and right coronary artery calcifications.  In ED: cardiology consulted-Dr. Quentin Ore, started on heparin gtt, given ASA and NTG.   Plan of care: The patient is accepted for admission to Progressive unit, at [facility]..    Author: Orma Flaming, MD 10/30/2021  Check [email or web address] for on-call coverage.  Nursing staff, Please call [place] number on Amion as soon as patient's arrival, so appropriate admitting provider can evaluate the pt.

## 2021-10-30 NOTE — H&P (Incomplete)
The patient has been seen in conjunction with Fabian Sharp, PA-C. All aspects of care have been considered and discussed. The patient has been personally interviewed, examined, and all clinical data has been reviewed.  The patient has acute coronary syndrome without ST segment elevation criteria for ST elevation MI but with suspicion that he has a high lateral or anterior wall ischemic event that is evolving based upon the initial EKG where there was minimal V2 ST elevation. Because of ongoing pain, elevated troponin levels, and the EKG, we have recommended urgent coronary angiography to define anatomy and help guide statin therapy.  CRITICAL care time 40 minutes.  Cardiology Admission History and Physical   Patient ID: Alex Arias MRN: [id number]; DOB: [birth year]   Admission date: 10/30/2021  PCP:  Christain Sacramento, MD   [facility] Cardiologist:  Sinclair Grooms, MD   Chief Complaint:  NSTEMI  Patient Profile:   Alex Arias is a 66 y.o. male with HTN, fibromuscular dysplasia of renal artery, and hx of hernias who is being seen 10/30/2021 for the evaluation of NSTEMI.  History of Present Illness:   Alex Arias has no prior cardiac history. He presented to [facility] this morning with chest pain concerning for angina. HS troponin trended from 3857 --> 7362. EKG with ST depression inferior leads. Initial plan for IM admission and consideration of heart catheterization for Monday. However, patient continued to have chest pain not controlled with SL or IV nitro and decision was made to transport to [facility] cath lab for definitive angiography.   Past Medical History:  Diagnosis Date   Abdominal distension    Abdominal pain    Cough    Family history of diabetes mellitus    Itching    Pleurisy    Right inguinal hernia    Unspecified viral infection, in conditions classified elsewhere and of unspecified site    viral syndrome - per notes from [facility]  dated 01/03/11    Past Surgical History:  Procedure Laterality Date   BOWEL RESECTION N/A 04/02/2014   Procedure: SMALL BOWEL RESECTION;  Surgeon: Georganna Skeans, MD;  Location: [place];  Service: General;  Laterality: N/A;   INGUINAL HERNIA REPAIR  02/01/2011   Procedure: LAPAROSCOPIC INGUINAL HERNIA;  Surgeon: Stark Klein, MD;  Location: [place];  Service: General;  Laterality: Bilateral;  Laparoscopic inguinal repair with mesh - Bilateral   INGUINAL HERNIA REPAIR Right 04/02/2014   Procedure: HERNIA REPAIR INGUINAL INCARCERATED ;  Surgeon: Georganna Skeans, MD;  Location: [place];  Service: General;  Laterality: Right;   WISDOM TOOTH EXTRACTION       Medications Prior to Admission: Prior to Admission medications   Medication Sig Start Date End Date Taking? Authorizing Provider  aspirin 325 MG EC tablet Take 325 mg by mouth every 6 (six) hours as needed for pain.    [provider]  bismuth subsalicylate (PEPTO BISMOL) 262 MG/15ML suspension Take 15 mLs by mouth every 6 (six) hours as needed. For stomach upset     [provider]  ciprofloxacin (CIPRO) 500 MG tablet Take 1 tablet (500 mg total) by mouth 2 (two) times daily. 05/23/19   Eustaquio Maize, PA-C  levothyroxine (SYNTHROID) 88 MCG tablet Take 88 mcg by mouth daily. 05/12/19   [provider]  Multiple Vitamin (MULITIVITAMIN WITH MINERALS) TABS Take 1 tablet by mouth daily.      [provider]  Multiple Vitamin (MULTI-VITAMIN) tablet Take 1 tablet by mouth  daily.    [provider]  olmesartan-hydrochlorothiazide (BENICAR HCT) 20-12.5 MG tablet Take 1 tablet by mouth daily. 03/18/19   [provider]  ondansetron (ZOFRAN ODT) 4 MG disintegrating tablet Take 1 tablet (4 mg total) by mouth every 8 (eight) hours as needed for nausea or vomiting. 05/23/19   Eustaquio Maize, PA-C     Allergies:   No Known Allergies  Social History:   Social History   Socioeconomic History   Marital status:  Married    Spouse name: Not on file   Number of children: Not on file   Years of education: Not on file   Highest education level: Not on file  Occupational History   Not on file  Tobacco Use   Smoking status: Never   Smokeless tobacco: Never  Substance and Sexual Activity   Alcohol use: Yes    Comment: 1 - 3 wine or beer weekly   Drug use: No   Sexual activity: Not on file  Other Topics Concern   Not on file  Social History Narrative   Not on file   Social Determinants of Health   Financial Resource Strain: Not on file  Food Insecurity: Not on file  Transportation Needs: Not on file  Physical Activity: Not on file  Stress: Not on file  Social Connections: Not on file  Intimate Partner Violence: Not on file    Family History:   The patient's family history is not on file.    ROS:  Please see the history of present illness.  All other ROS reviewed and negative.     Physical Exam/Data:   Vitals:   10/30/21 1200 10/30/21 1230 10/30/21 1336 10/30/21 1400  BP: 136/83 128/83  129/82  Pulse: 93 86  64  Resp: 18 17  20   Temp:   98.4 F (36.9 C)   TempSrc:   Oral   SpO2: 99% 94%  98%  Weight:      Height:        Intake/Output Summary (Last 24 hours) at 10/30/2021 1459 Last data filed at 10/30/2021 1207 Gross per 24 hour  Intake 1044.62 ml  Output --  Net 1044.62 ml      10/30/2021   10:26 AM 05/23/2019    7:48 AM 06/25/2015    8:25 AM  Last 3 Weights  Weight (lbs) 225 lb 225 lb 215 lb  Weight (kg) 102.059 kg 102.059 kg 97.523 kg     Body mass index is 27.39 kg/m.  General:  Well nourished, well developed, in no acute distress patient is having ongoing moderate chest discomfort when examined in the Cath Lab. HEENT: normal Neck: no JVD Vascular: No carotid bruits; Distal pulses 2+ bilaterally   Cardiac:  normal S1, S2; RRR; no murmur or pericardial rub Lungs:  clear to auscultation bilaterally, no wheezing, rhonchi or rales  Abd: soft, nontender, no  hepatomegaly  Ext: no edema Musculoskeletal:  No deformities, BUE and BLE strength normal and equal Skin: warm and dry  Neuro:  CNs 2-12 intact, no focal abnormalities noted Psych:  Normal affect    EKG:  The ECG that was done was personally reviewed and demonstrates sinus rhythm HR 59, anteroseptal Q waves, ST depression inferiorly  Relevant CV Studies:  Heart catheterization final report pending  Laboratory Data:  High Sensitivity Troponin:   Recent Labs  Lab 10/30/21 1035 10/30/21 1232  TROPONINIHS 3,857* 7,362*      Chemistry Recent Labs  Lab 10/30/21 1035  NA  140  K 3.5  CL 102  CO2 24  GLUCOSE 172*  BUN 18  CREATININE 1.17  CALCIUM 9.7  GFRNONAA >60  ANIONGAP 14    Recent Labs  Lab 10/30/21 1035  PROT 7.7  ALBUMIN 4.7  AST 73*  ALT 41  ALKPHOS 53  BILITOT 1.1   Lipids No results for input(s): "CHOL", "TRIG", "HDL", "LABVLDL", "LDLCALC", "CHOLHDL" in the last 168 hours. Hematology Recent Labs  Lab 10/30/21 1035  WBC 11.7*  RBC 5.09  HGB 16.3  HCT 45.2  MCV 88.8  MCH 32.0  MCHC 36.1*  RDW 13.5  PLT 170   Thyroid No results for input(s): "TSH", "FREET4" in the last 168 hours. BNPNo results for input(s): "BNP", "PROBNP" in the last 168 hours.  DDimer No results for input(s): "DDIMER" in the last 168 hours.   Radiology/Studies:  CT Angio Chest/Abd/Pel for Dissection W and/or Wo Contrast  Result Date: 10/30/2021 CLINICAL DATA:  Patient reports some diarrhea last night, then developing projectile vomiting. Epigastric and chest pain, nonradiating. Distended abdomen. EXAM: CT ANGIOGRAPHY CHEST, ABDOMEN AND PELVIS TECHNIQUE: Multidetector CT imaging through the chest, abdomen and pelvis was performed using the standard protocol during bolus administration of intravenous contrast. Multiplanar reconstructed images and MIPs were obtained and reviewed to evaluate the vascular anatomy. RADIATION DOSE REDUCTION: This exam was performed according to the  departmental dose-optimization program which includes automated exposure control, adjustment of the mA and/or kV according to patient size and/or use of iterative reconstruction technique. CONTRAST:  130mL OMNIPAQUE IOHEXOL 350 MG/ML SOLN COMPARISON:  09/24/2020 and 05/23/2019, CT abdomen and pelvis. FINDINGS: CTA CHEST FINDINGS Cardiovascular: Thoracic aorta is normal in caliber. No dissection. Mild atherosclerosis. Arch branch vessels are widely patent. Heart is normal in size and configuration. Mild left and right coronary artery calcifications. No pericardial effusion. Mediastinum/Nodes: No enlarged mediastinal, hilar, or axillary lymph nodes. Thyroid gland, trachea, and esophagus demonstrate no significant findings. Lungs/Pleura: Lungs are clear. No pleural effusion or pneumothorax. Musculoskeletal: No fracture or acute finding. No bone lesion. No chest wall mass. Review of the MIP images confirms the above findings. CTA ABDOMEN AND PELVIS FINDINGS VASCULAR Aorta: Atherosclerosis with no significant stenosis. No dissection. No evidence of a penetrating ulcer or mural hematoma. Celiac: Patent without evidence of aneurysm, dissection, vasculitis or significant stenosis. SMA: Mild atherosclerotic plaque at the origin, without significant stenosis. No aneurysm or dissection. Renals: Both renal arteries are patent without evidence of aneurysm, dissection, vasculitis, fibromuscular dysplasia or significant stenosis. IMA: Patent without evidence of aneurysm, dissection, vasculitis or significant stenosis. Inflow: Patent without evidence of aneurysm, dissection, vasculitis or significant stenosis. Veins: No obvious venous abnormality within the limitations of this arterial phase study. Review of the MIP images confirms the above findings. NON-VASCULAR Hepatobiliary: No focal liver abnormality is seen. No gallstones, gallbladder wall thickening, or biliary dilatation. Pancreas: Unremarkable. No pancreatic ductal  dilatation or surrounding inflammatory changes. Spleen: Normal in size without focal abnormality. Adrenals/Urinary Tract: No adrenal mass. Mild renal cortical thinning. 1 cm low-density mass, posterior upper pole the left kidney, consistent with a cyst and unchanged, no follow-up recommended. No other renal masses, no stones and no hydronephrosis. Normal ureters. Normal bladder. Stomach/Bowel: Moderately distended stomach, otherwise unremarkable. Small bowel and colon are normal in caliber. No wall thickening. No inflammation. Numerous colonic diverticula without diverticulitis. Normal appendix visualized. Lymphatic: No enlarged lymph nodes. Reproductive: Mildly enlarged prostate, 5.2 x 4.2 cm transversely. Other: Small fat containing umbilical hernia.  No ascites. Musculoskeletal: No fracture.  No bone lesion. Review of the MIP images confirms the above findings. IMPRESSION: CTA 1. No aortic aneurysm or dissection. No findings of acute aortic syndrome. 2. Aortic branch vessels are patent, with no hemodynamically significant stenosis. NON CTA 1. No acute findings within the chest, abdomen or pelvis. 2. Mild left and right coronary artery calcifications. Electronically Signed   By: Lajean Manes M.D.   On: 10/30/2021 12:11     Assessment and Plan:   NSTEMI HS troponin 3857 --> 7362 EKG with anteroseptal Q waves and ST depression inferiorly ASA, heparin, nitro given    Risk factor modification Will check a lipid panel and LP (a)   Hypertension Fibromuscular dysplasia of renal artery Maintained on olmesartan-HCTZ 20-12.5 mg  Will need to follow pressure   Hypothyroidism Continue synthroid   Risk Assessment/Risk Scores:    TIMI Risk Score for Unstable Angina or Non-ST Elevation MI:   The patient's TIMI risk score is 5, which indicates a 26% risk of all cause mortality, new or recurrent myocardial infarction or need for urgent revascularization in the next 14 days.       Severity of  Illness: The appropriate patient status for this patient is INPATIENT. Inpatient status is judged to be reasonable and necessary in order to provide the required intensity of service to ensure the patient's safety. The patient's presenting symptoms, physical exam findings, and initial radiographic and laboratory data in the context of their chronic comorbidities is felt to place them at high risk for further clinical deterioration. Furthermore, it is not anticipated that the patient will be medically stable for discharge from the hospital within 2 midnights of admission.   * I certify that at the point of admission it is my clinical judgment that the patient will require inpatient hospital care spanning beyond 2 midnights from the point of admission due to high intensity of service, high risk for further deterioration and high frequency of surveillance required.*   For questions or updates, please contact [place] Please consult [email or web address] for contact info under     Signed, Ledora Bottcher, PA  10/30/2021 2:59 PM

## 2021-10-30 NOTE — ED Notes (Signed)
CRITICAL VALUE STICKER  CRITICAL VALUE:Trop 3,857  RECEIVER (on-site recipient of call):I.Tyeasha Ebbs  DATE & TIME NOTIFIED: 10/30/21 1113  MESSENGER (representative from lab):lab  MD NOTIFIED: [place]  TIME OF NOTIFICATION:1114  RESPONSE:

## 2021-10-30 NOTE — ED Notes (Signed)
[facility] at bedside. Butch Penny RN will be going with Transport to aid in patient's care. VSS at this time. Pt AxOx4.

## 2021-10-30 NOTE — ED Notes (Signed)
[facility] to arrive in 5-10 mins for transport

## 2021-10-31 ENCOUNTER — Telehealth (HOSPITAL_COMMUNITY): Payer: Self-pay | Admitting: Pharmacy Technician

## 2021-10-31 ENCOUNTER — Encounter: Payer: Self-pay | Admitting: *Deleted

## 2021-10-31 ENCOUNTER — Other Ambulatory Visit (HOSPITAL_COMMUNITY): Payer: Self-pay

## 2021-10-31 ENCOUNTER — Encounter (HOSPITAL_COMMUNITY): Payer: Self-pay | Admitting: Interventional Cardiology

## 2021-10-31 DIAGNOSIS — Z955 Presence of coronary angioplasty implant and graft: Secondary | ICD-10-CM

## 2021-10-31 DIAGNOSIS — E785 Hyperlipidemia, unspecified: Secondary | ICD-10-CM

## 2021-10-31 DIAGNOSIS — I214 Non-ST elevation (NSTEMI) myocardial infarction: Secondary | ICD-10-CM | POA: Insufficient documentation

## 2021-10-31 DIAGNOSIS — Z006 Encounter for examination for normal comparison and control in clinical research program: Secondary | ICD-10-CM

## 2021-10-31 DIAGNOSIS — E876 Hypokalemia: Secondary | ICD-10-CM

## 2021-10-31 DIAGNOSIS — I10 Essential (primary) hypertension: Secondary | ICD-10-CM

## 2021-10-31 DIAGNOSIS — I25110 Atherosclerotic heart disease of native coronary artery with unstable angina pectoris: Secondary | ICD-10-CM | POA: Diagnosis not present

## 2021-10-31 DIAGNOSIS — I5041 Acute combined systolic (congestive) and diastolic (congestive) heart failure: Secondary | ICD-10-CM | POA: Diagnosis not present

## 2021-10-31 LAB — LIPID PANEL
Cholesterol: 122 mg/dL (ref 0–200)
HDL: 44 mg/dL (ref >40)
LDL Cholesterol: 57 mg/dL (ref 0–99)
Total CHOL/HDL Ratio: 2.8 RATIO
Triglycerides: 106 mg/dL (ref <150)
VLDL: 21 mg/dL (ref 0–40)

## 2021-10-31 LAB — CBC
HCT: 41.4 % (ref 39.0–52.0)
Hemoglobin: 14.8 g/dL (ref 13.0–17.0)
MCH: 31.9 pg (ref 26.0–34.0)
MCHC: 35.7 g/dL (ref 30.0–36.0)
MCV: 89.2 fL (ref 80.0–100.0)
Platelets: 152 10*3/uL (ref 150–400)
RBC: 4.64 MIL/uL (ref 4.22–5.81)
RDW: 13.7 % (ref 11.5–15.5)
WBC: 10.6 10*3/uL — ABNORMAL HIGH (ref 4.0–10.5)
nRBC: 0 % (ref 0.0–0.2)

## 2021-10-31 LAB — BASIC METABOLIC PANEL
Anion gap: 9 (ref 5–15)
BUN: 15 mg/dL (ref 8–23)
CO2: 22 mmol/L (ref 22–32)
Calcium: 8.4 mg/dL — ABNORMAL LOW (ref 8.9–10.3)
Chloride: 106 mmol/L (ref 98–111)
Creatinine, Ser: 1.1 mg/dL (ref 0.61–1.24)
GFR, Estimated: >60 mL/min (ref >60)
Glucose, Bld: 132 mg/dL — ABNORMAL HIGH (ref 70–99)
Potassium: 3.5 mmol/L (ref 3.5–5.1)
Sodium: 137 mmol/L (ref 135–145)

## 2021-10-31 LAB — HEMOGLOBIN A1C
Hgb A1c MFr Bld: 5.1 % (ref 4.8–5.6)
Mean Plasma Glucose: 99.67 mg/dL

## 2021-10-31 MED ORDER — ATORVASTATIN CALCIUM 40 MG PO TABS
40.0000 mg | ORAL_TABLET | Freq: Every day | ORAL | Status: DC
  Administered 2021-10-31 – 2021-11-02 (×3): 40 mg via ORAL
  Filled 2021-10-31 (×3): qty 1

## 2021-10-31 MED ORDER — ORAL CARE MOUTH RINSE: 15.0000 mL | OROMUCOSAL | Status: DC | PRN

## 2021-10-31 MED ORDER — CHLORHEXIDINE GLUCONATE CLOTH 2 % EX PADS
6.0000 | MEDICATED_PAD | Freq: Every day | CUTANEOUS | Status: DC
  Administered 2021-10-31 – 2021-11-02 (×3): 6 via TOPICAL

## 2021-10-31 MED ORDER — ROSUVASTATIN CALCIUM 20 MG PO TABS: 40.0000 mg | ORAL_TABLET | Freq: Every day | ORAL | Status: DC

## 2021-10-31 MED ORDER — POTASSIUM CHLORIDE CRYS ER 20 MEQ PO TBCR
40.0000 meq | EXTENDED_RELEASE_TABLET | Freq: Once | ORAL | Status: AC
  Administered 2021-10-31: 40 meq via ORAL
  Filled 2021-10-31: qty 2

## 2021-10-31 MED ORDER — CARVEDILOL 3.125 MG PO TABS
3.1250 mg | ORAL_TABLET | Freq: Two times a day (BID) | ORAL | Status: DC
  Administered 2021-10-31 – 2021-11-02 (×5): 3.125 mg via ORAL
  Filled 2021-10-31 (×5): qty 1

## 2021-10-31 NOTE — Progress Notes (Unsigned)
EVOLVE Informed Consent   Subject Name: Alex Arias  Subject met inclusion and exclusion criteria.  The informed consent form, study requirements and expectations were reviewed with the subject and questions and concerns were addressed prior to the signing of the consent form.  The subject verbalized understanding of the trial requirements.  The subject agreed to participate in the EVOLVE trial and signed the informed consent at 11:57 on 10-31-2021.  The informed consent was obtained prior to performance of any protocol-specific procedures for the subject.  A copy of the signed informed consent was given to the subject and a copy was placed in the subject's medical record.   [place] Zakayla Martinec, Research Coordinator 10/31/2021       Protocol # [id number]   Subject ID#   [id number]                       DAY 1 Date:        31-Oct-2021 Randomization:   Geographic Region:    [place]  Randomization Date:     31-Oct-2021  Randomization Time:  12:09     Treatment type Assigned   '[x]'$  Treatment                                   '[]'$   Control         Protocol # [id number]  Subject Initial ID#:  [id number]                              Age in years: 62  *Demographics are found in the [facility] EMR source.   Protocol Version: v2.00 [id number]  Were all Eligibility Criteria Met?  $Rem'[x]'vJOz$  Yes  $Re'[]'xhg$   No  Screening Visit Date:               31-Oct-2021      Protocol # [id number]   Subject ID#                               DAY 1 Date:        dd-mmm-yyyy     Initial Study Treatment- Evolocumab Self-administration  Training for Evolocumab Self-Administration Completed?  $RemoveBef'[]'yeDudRAacD$  YES  $Re'[]'RDI$   NO  Training for Evolocumab Self-Administration Date:   _____  dd-mmm-yyyy  Evolocumab Administered?  $RemoveBefore'[]'RWAFZdKtKUlsm$  YES  $Re'[]'EEx$   NO  Evolocumab Start Date: ________  dd-mmm-yyyy  Lot Number of Initial Dose Administered: _  Additional Lot Number Distributed to Subject: -  Additional Lot Number Distributed to Subject: -  Additional  Lot Number Distributed to Subject: -  Additional Lot Number Distributed to Subject: -  Additional Lot Number Distributed to Subject: -

## 2021-10-31 NOTE — Progress Notes (Signed)
Rounding Note    Patient Name: Alex Arias Date of Encounter: 10/31/2021  [place] Cardiologist: Sinclair Grooms, MD   Patient Profile     66 y.o. male with history of bilateral inguinal hernias with incarcerated right hernia, HTN, fibromuscular dysplasia of renal artery who presented with chest pain. It started out last night with N/V/D and then had epigastric pain that radiated to his back.  With + Troponin, borderline ischemic EKG changes & ongoing pain, he was taken urgently to the [facility] lab as an Urgent NSTEMI - Cath with 99% subTO of LAD => DES PCI; pathophysiology consistent with aborted STEMI.  EF 40-45% (LAD WMA) - Echo pending.   Assessment & Plan    Principal Problem:   NSTEMI (non-ST elevated myocardial infarction) ([place]) - + Troponin with Anterior & Inferior ST depressions => ongoing CP => Urgent Cath; hsTroponin > 24 K Active Problems:   Coronary artery disease involving native coronary artery of native heart with unstable angina pectoris (HCC)   Presence of drug coated stent in LAD coronary artery   Acute combined systolic and diastolic heart failure (HCC) - NSTEMI, LAD sub TO => LVEDP 21-25 mmHg   Essential hypertension   Hyperlipidemia with target LDL less than 70   Hypokalemia  NSTEMI/CAD/PCI:  DAPT - ASA/Brilinta x min 6 months, then could d/c ASA & continue Brilinta monotherapy to complete year 1.  ICM: Echo pending - EF 40-45% on LV Gram -- add Coreg low dose now consider SGLT-2I & if BP would tolerate,  ARB/ARNI (was on Olmesartan @ home) pending result of BB initiation.  BP stable, but with ICM, freq PVCs & AIVR, will start Coreg 3.125 mg BID  Off of IV NTG  Lipids reviewed - @ goal (but need to reassess in 3 months) was on low dose Atorvastatin 20 mg => . will increase to at least 40mg  -  will evaluate for EVOLVE (Repatha study) - but LDL already @ 57  HypoKalemia - 3.5 -> PO supplement  DISPO: [facility] to transfer to [facility].  Anticipate  d/c by Wed 10/18 to allow time for Medication titration - Ambulate in hallway today  =================================================================== Subjective   Feels well - no further CP.  No sensation of palpitations or arrhythmia  Has not yet walked  Inpatient Medications    Scheduled Meds:  aspirin  81 mg Oral Daily   carvedilol  3.125 mg Oral BID WC   Chlorhexidine Gluconate Cloth  6 each Topical Daily   heparin  5,000 Units Subcutaneous Q8H   potassium chloride  40 mEq Oral Once   rosuvastatin  40 mg Oral Daily   sodium chloride flush  3 mL Intravenous Q12H   ticagrelor  90 mg Oral BID   Continuous Infusions:  sodium chloride     nitroGLYCERIN Stopped (10/30/21 1541)   PRN Meds: sodium chloride, acetaminophen, nitroGLYCERIN, ondansetron (ZOFRAN) IV, mouth rinse, oxyCODONE, sodium chloride flush   Vital Signs    Vitals:   10/31/21 0745 10/31/21 0800 10/31/21 0815 10/31/21 0830  BP: 127/83 129/84 126/84 127/84  Pulse: 90 86 88 85  Resp: 18 20 18 18   Temp: 99.6 F (37.6 C)     TempSrc: Oral     SpO2: 98% 98% 97% 98%  Weight:      Height:        Intake/Output Summary (Last 24 hours) at 10/31/2021 0839 Last data filed at 10/31/2021 0730 Gross per 24 hour  Intake 1813.19 ml  Output 400 ml  Net 1413.19 ml      10/30/2021   10:26 AM 05/23/2019    7:48 AM 06/25/2015    8:25 AM  Last 3 Weights  Weight (lbs) 225 lb 225 lb 215 lb  Weight (kg) 102.059 kg 102.059 kg 97.523 kg     Lab Results  Component Value Date   CHOL 122 10/31/2021   HDL 44 10/31/2021   LDLCALC 57 10/31/2021   TRIG 106 10/31/2021   CHOLHDL 2.8 10/31/2021    Telemetry    SR 80s, PVCs, AIVR - Personally Reviewed  ECG      NSR 91, LAD, SMI - ~ recent. - Personally Reviewed   Cardiac Studies   Cath-PCI 10/30/2021: 99% thrombotic SubTO of mLAD just after D2 -> DES PCI 3.0 x 16 Synergy XD => deployed to 3.0 mm.  2 big Diags, LCx-OM1 & Large Dominant RCA - rPDA & PAV-RPL1-3 free  of disease. EF ~40-45%, LVEDP 21--25 mmHg mid-apical anterior & apical HK.  Physical Exam   GEN: No acute distress.   Neck: No JVD or buit Cardiac: RRR, no murmurs, rubs, or gallops.  Respiratory: Clear to auscultation bilaterally.no-labored  GI: Soft, nontender, non-distended; NABS MS: No edema; No deformity. R Radial site C/D/I - + [place]. Neuro:  Nonfocal  Psych: Normal affect   Labs    High Sensitivity Troponin:   Recent Labs  Lab 10/30/21 1035 10/30/21 1232 10/30/21 1430 10/30/21 1836  TROPONINIHS 3,857* 7,362* 10,586* >24,000*     Chemistry Recent Labs  Lab 10/30/21 1035 10/30/21 1836 10/31/21 0232  NA 140  --  137  K 3.5  --  3.5  CL 102  --  106  CO2 24  --  22  GLUCOSE 172*  --  132*  BUN 18  --  15  CREATININE 1.17 1.13 1.10  CALCIUM 9.7  --  8.4*  PROT 7.7  --   --   ALBUMIN 4.7  --   --   AST 73*  --   --   ALT 41  --   --   ALKPHOS 53  --   --   BILITOT 1.1  --   --   GFRNONAA >60 >60 >60  ANIONGAP 14  --  9    Lipids  Recent Labs  Lab 10/31/21 0232  CHOL 122  TRIG 106  HDL 44  LDLCALC 57  CHOLHDL 2.8    Hematology Recent Labs  Lab 10/30/21 1035 10/30/21 1836 10/31/21 0232  WBC 11.7* 12.2* 10.6*  RBC 5.09 4.85 4.64  HGB 16.3 15.4 14.8  HCT 45.2 43.8 41.4  MCV 88.8 90.3 89.2  MCH 32.0 31.8 31.9  MCHC 36.1* 35.2 35.7  RDW 13.5 13.5 13.7  PLT 170 173 152   Thyroid No results for input(s): "TSH", "FREET4" in the last 168 hours.  BNPNo results for input(s): "BNP", "PROBNP" in the last 168 hours.  DDimer No results for input(s): "DDIMER" in the last 168 hours.   Radiology    CARDIAC CATHETERIZATION  Result Date: 10/30/2021 CONCLUSIONS: 99% stenosis of the mid LAD after the large second diagonal.  TIMI grade I flow was noted in the vessel.  The vessel was treated with angioplasty and stenting using a 3.0 x 16 Synergy reducing the stenosis to 0% with improvement in TIMI flow to 2.5.  Sluggish flow was related to distal  embolization and was treated with IV Aggrastat, IC verapamil, and nitroglycerin intracoronary. Right dominant anatomy without significant  obstruction. Left main is widely patent. Circumflex is small and widely patent. Mid to apical anterior wall severe hypokinesis.  EF 40 to 50%.  LVEDP greater than 22 mmHg. RECOMMENDATIONS: Preventive therapy with high intensity statin, beta-blocker, and ACE inhibitor therapy if tolerated. Dual antiplatelet therapy with aspirin and Brilinta.  Should continue high intensity antiplatelet treatment for at least 6 months and then perhaps Brilinta monotherapy or converting to clopidogrel monotherapy could be considered. Patient will need a 2D Doppler echocardiogram.   CT Angio Chest/Abd/Pel for Dissection W and/or Wo Contrast  Result Date: 10/30/2021 CLINICAL DATA:  Patient reports some diarrhea last night, then developing projectile vomiting. Epigastric and chest pain, nonradiating. Distended abdomen. EXAM: CT ANGIOGRAPHY CHEST, ABDOMEN AND PELVIS TECHNIQUE: Multidetector CT imaging through the chest, abdomen and pelvis was performed using the standard protocol during bolus administration of intravenous contrast. Multiplanar reconstructed images and MIPs were obtained and reviewed to evaluate the vascular anatomy. RADIATION DOSE REDUCTION: This exam was performed according to the departmental dose-optimization program which includes automated exposure control, adjustment of the mA and/or kV according to patient size and/or use of iterative reconstruction technique. CONTRAST:  152mL OMNIPAQUE IOHEXOL 350 MG/ML SOLN COMPARISON:  09/24/2020 and 05/23/2019, CT abdomen and pelvis. FINDINGS: CTA CHEST FINDINGS Cardiovascular: Thoracic aorta is normal in caliber. No dissection. Mild atherosclerosis. Arch branch vessels are widely patent. Heart is normal in size and configuration. Mild left and right coronary artery calcifications. No pericardial effusion. Mediastinum/Nodes: No enlarged  mediastinal, hilar, or axillary lymph nodes. Thyroid gland, trachea, and esophagus demonstrate no significant findings. Lungs/Pleura: Lungs are clear. No pleural effusion or pneumothorax. Musculoskeletal: No fracture or acute finding. No bone lesion. No chest wall mass. Review of the MIP images confirms the above findings. CTA ABDOMEN AND PELVIS FINDINGS VASCULAR Aorta: Atherosclerosis with no significant stenosis. No dissection. No evidence of a penetrating ulcer or mural hematoma. Celiac: Patent without evidence of aneurysm, dissection, vasculitis or significant stenosis. SMA: Mild atherosclerotic plaque at the origin, without significant stenosis. No aneurysm or dissection. Renals: Both renal arteries are patent without evidence of aneurysm, dissection, vasculitis, fibromuscular dysplasia or significant stenosis. IMA: Patent without evidence of aneurysm, dissection, vasculitis or significant stenosis. Inflow: Patent without evidence of aneurysm, dissection, vasculitis or significant stenosis. Veins: No obvious venous abnormality within the limitations of this arterial phase study. Review of the MIP images confirms the above findings. NON-VASCULAR Hepatobiliary: No focal liver abnormality is seen. No gallstones, gallbladder wall thickening, or biliary dilatation. Pancreas: Unremarkable. No pancreatic ductal dilatation or surrounding inflammatory changes. Spleen: Normal in size without focal abnormality. Adrenals/Urinary Tract: No adrenal mass. Mild renal cortical thinning. 1 cm low-density mass, posterior upper pole the left kidney, consistent with a cyst and unchanged, no follow-up recommended. No other renal masses, no stones and no hydronephrosis. Normal ureters. Normal bladder. Stomach/Bowel: Moderately distended stomach, otherwise unremarkable. Small bowel and colon are normal in caliber. No wall thickening. No inflammation. Numerous colonic diverticula without diverticulitis. Normal appendix visualized.  Lymphatic: No enlarged lymph nodes. Reproductive: Mildly enlarged prostate, 5.2 x 4.2 cm transversely. Other: Small fat containing umbilical hernia.  No ascites. Musculoskeletal: No fracture.  No bone lesion. Review of the MIP images confirms the above findings. IMPRESSION: CTA 1. No aortic aneurysm or dissection. No findings of acute aortic syndrome. 2. Aortic branch vessels are patent, with no hemodynamically significant stenosis. NON CTA 1. No acute findings within the chest, abdomen or pelvis. 2. Mild left and right coronary artery calcifications. Electronically Signed   By:  Lajean Manes M.D.   On: 10/30/2021 12:11       For questions or updates, please contact [place] Please consult [email or web address] for contact info under        Signed, Glenetta Hew, MD  10/31/2021, 8:39 AM

## 2021-10-31 NOTE — Research (Signed)
EVOLVE Informed Consent   Subject Name: Neldon Shepard  Subject met inclusion and exclusion criteria.  The informed consent form, study requirements and expectations were reviewed with the subject and questions and concerns were addressed prior to the signing of the consent form.  The subject verbalized understanding of the trial requirements.  The subject agreed to participate in the EVOLVE  trial and signed the informed consent at 11:56  on 10-31-2021.  The informed consent was obtained prior to performance of any protocol-specific procedures for the subject.  A copy of the signed informed consent was given to the subject and a copy was placed in the subject's medical record.   [place] Ariel Wingrove,Research Coordinator 10/31/2021  11:57      Protocol # [id number]   Subject ID#  [id number]                             DAY 1 Date:        31-Oct-2021  Randomization:   Geographic Region:    [place]  Randomization Date:     31-Oct-2021  Randomization Time:  12:09     Treatment type Assigned   '[x]'$  Treatment                                   '[]'$   Control            Protocol # [id number]  Subject Initial ID#:      [id number]                            Age in years: 2  *Demographics are found in the [facility] EMR source.   Protocol Version: v2.00 [id number]  Were all Eligibility Criteria Met?  $Rem'[x]'PTUy$  Yes  $Re'[]'pMW$   No  Screening Visit Date:                31-Oct-2021      Protocol # [id number]   Subject ID#  [id number]                             DAY 1 Date:        dd-mmm-yyyy     Initial Study Treatment- Evolocumab Self-administration  Training for Evolocumab Self-Administration Completed?  $RemoveBef'[x]'oOcjOjxzIQ$  YES  $Re'[]'FgA$   NO  Training for Evolocumab Self-Administration Date:  01-Nov-2021  Evolocumab Administered?  $RemoveBefore'[x]'yjzFhCPRRvEsB$  YES  $Re'[]'IgF$   NO  Evolocumab Start Date:   01-Nov-2021  Lot Number of Initial Dose Administered: [id number]  Additional Lot Number Distributed to Subject: [id number]  Additional Lot Number  Distributed to Subject: [id number]  Additional Lot Number Distributed to Subject: [id number]  Additional Lot Number Distributed to Subject: [id number]  Additional Lot Number Distributed to Subject: - [id number]

## 2021-10-31 NOTE — TOC Benefit Eligibility Note (Addendum)
Patient Teacher, English as a foreign language completed.    The patient is currently admitted and upon discharge could be taking Brilinta 90 mg.  The current 30 day co-pay is $40.00.   The patient is currently admitted and upon discharge could be taking Farxiga 10 mg.  Requires Prior Authorization  The patient is currently admitted and upon discharge could be taking Jardiance 10 mg.  Requires Prior Authorization  The patient is insured through [place], [place] Patient Advocate Specialist [place] Patient Advocate Team Direct Number: [phone]  Fax: [phone]

## 2021-10-31 NOTE — Progress Notes (Signed)
Pt ambulated with RN earlier, 2 laps. Left materials for pt and wife to review and discussed briefly. Eager to do CRPII. Will place referral for [place]. Left MI book, diet, exercise info. [place], ACSM-CEP 10/31/2021 2:03 PM

## 2021-10-31 NOTE — Telephone Encounter (Signed)
Pharmacy Patient Advocate Encounter  Insurance verification completed.    The patient is insured through [employer]   The patient is currently admitted and ran test claims for the following: Brilinta.  Copays and coinsurance results were relayed to Inpatient clinical team.  

## 2021-11-01 ENCOUNTER — Inpatient Hospital Stay (HOSPITAL_COMMUNITY): Payer: BC Managed Care – PPO

## 2021-11-01 ENCOUNTER — Telehealth (HOSPITAL_COMMUNITY): Payer: Self-pay | Admitting: Pharmacy Technician

## 2021-11-01 ENCOUNTER — Other Ambulatory Visit (HOSPITAL_COMMUNITY): Payer: Self-pay

## 2021-11-01 DIAGNOSIS — I214 Non-ST elevation (NSTEMI) myocardial infarction: Secondary | ICD-10-CM | POA: Diagnosis not present

## 2021-11-01 DIAGNOSIS — I480 Paroxysmal atrial fibrillation: Secondary | ICD-10-CM | POA: Diagnosis not present

## 2021-11-01 DIAGNOSIS — I4891 Unspecified atrial fibrillation: Secondary | ICD-10-CM

## 2021-11-01 LAB — ECHOCARDIOGRAM COMPLETE
Height: 76 in
S' Lateral: 2.8 cm
Weight: 3600 oz

## 2021-11-01 LAB — BASIC METABOLIC PANEL
Anion gap: 14 (ref 5–15)
BUN: 11 mg/dL (ref 8–23)
CO2: 25 mmol/L (ref 22–32)
Calcium: 8.9 mg/dL (ref 8.9–10.3)
Chloride: 102 mmol/L (ref 98–111)
Creatinine, Ser: 1.24 mg/dL (ref 0.61–1.24)
GFR, Estimated: >60 mL/min (ref >60)
Glucose, Bld: 128 mg/dL — ABNORMAL HIGH (ref 70–99)
Potassium: 3.9 mmol/L (ref 3.5–5.1)
Sodium: 141 mmol/L (ref 135–145)

## 2021-11-01 LAB — LIPOPROTEIN A (LPA): Lipoprotein (a): 115.1 nmol/L — ABNORMAL HIGH (ref <75.0)

## 2021-11-01 MED ORDER — CLOPIDOGREL BISULFATE 75 MG PO TABS
75.0000 mg | ORAL_TABLET | Freq: Every day | ORAL | Status: DC
  Administered 2021-11-02: 75 mg via ORAL
  Filled 2021-11-01: qty 1

## 2021-11-01 MED ORDER — AMIODARONE LOAD VIA INFUSION
150.0000 mg | Freq: Once | INTRAVENOUS | Status: AC
  Administered 2021-11-01: 150 mg via INTRAVENOUS
  Filled 2021-11-01: qty 83.34

## 2021-11-01 MED ORDER — AMIODARONE HCL IN DEXTROSE 360-4.14 MG/200ML-% IV SOLN
30.0000 mg/h | INTRAVENOUS | Status: DC
  Administered 2021-11-02: 30 mg/h via INTRAVENOUS
  Filled 2021-11-01: qty 200

## 2021-11-01 MED ORDER — AMIODARONE HCL IN DEXTROSE 360-4.14 MG/200ML-% IV SOLN
60.0000 mg/h | INTRAVENOUS | Status: AC
  Administered 2021-11-01 (×2): 60 mg/h via INTRAVENOUS
  Filled 2021-11-01 (×2): qty 200

## 2021-11-01 MED ORDER — EMPAGLIFLOZIN 10 MG PO TABS
10.0000 mg | ORAL_TABLET | Freq: Every day | ORAL | Status: DC
  Administered 2021-11-02: 10 mg via ORAL
  Filled 2021-11-01 (×2): qty 1

## 2021-11-01 MED ORDER — APIXABAN 5 MG PO TABS
5.0000 mg | ORAL_TABLET | Freq: Two times a day (BID) | ORAL | Status: DC
  Administered 2021-11-01 – 2021-11-02 (×2): 5 mg via ORAL
  Filled 2021-11-01 (×2): qty 1

## 2021-11-01 MED ORDER — INFLUENZA VAC A&B SA ADJ QUAD 0.5 ML IM PRSY
0.5000 mL | PREFILLED_SYRINGE | INTRAMUSCULAR | Status: AC
  Administered 2021-11-02: 0.5 mL via INTRAMUSCULAR
  Filled 2021-11-01 (×2): qty 0.5

## 2021-11-01 MED ORDER — CLOPIDOGREL BISULFATE 300 MG PO TABS
300.0000 mg | ORAL_TABLET | Freq: Once | ORAL | Status: AC
  Administered 2021-11-01: 300 mg via ORAL
  Filled 2021-11-01: qty 1

## 2021-11-01 MED ORDER — STUDY - EVOLVE-MI - EVOLOCUMAB (REPATHA) 140 MG/ML SQ INJECTION (PI-STUCKEY)
140.0000 mg | INJECTION | SUBCUTANEOUS | Status: DC
  Administered 2021-11-01: 140 mg via SUBCUTANEOUS
  Filled 2021-11-01: qty 1

## 2021-11-01 NOTE — Discharge Summary (Incomplete)
Discharge Summary    Patient ID: Alex Arias MRN: [id number]; DOB: 07/30/1955  Admit date: 10/30/2021 Discharge date: 11/02/2021  PCP:  Barbie Banner, MD   [facility] Providers Cardiologist:  Lesleigh Noe, MD   ; Marykay Lex, MD.  Discharge Diagnoses    Principal Problem:   NSTEMI (non-ST elevated myocardial infarction) (HCC) - + Troponin with Anterior & Inferior ST depressions => ongoing CP => Urgent Cath; hsTroponin > 24 K Active Problems:   Essential hypertension   Acute combined systolic and diastolic heart failure (HCC) - NSTEMI, LAD sub TO => LVEDP 21-25 mmHg   Coronary artery disease involving native coronary artery of native heart with unstable angina pectoris (HCC)   Presence of drug coated stent in LAD coronary artery   Hyperlipidemia with target LDL less than 70   Hypokalemia   New onset atrial fibrillation (HCC)   Diagnostic Studies/Procedures    Cath-PCI 10/30/2021: 99% thrombotic SubTO of mLAD just after D2 -> DES PCI 3.0 x 16 Synergy XD => deployed to 3.0 mm.  2 big Diags, LCx-OM1 & Large Dominant RCA - rPDA & PAV-RPL1-3 free of disease. EF ~40-45%, LVEDP 21--25 mmHg mid-apical anterior & apical HK.   Diagnostic: Dominance: Right                                                                     Intervention                    LV Gram wall motion (1 normal, 2 Hypokinetic)     Echo 11/01/2021: LVEF 40 to 45% with mildly reduced function.  Severe hypokinesis of the mid-apical anterior anteroseptal and apical wall.  Moderate LVH.  Unable to assess diastolic function as the patient was in A-fib at the time.  Normal mitral and aortic valve.  Small pericardial effusion normal RV size and function.    History of Present Illness     Alex Arias is a 66 y.o. male with HTN, fibromuscular dysplasia of renal artery, and hx of hernias. He presented to [facility] the morning of admission with chest pain concerning for angina. HS troponin  trended from 3857 --> 7362. EKG with ST depression inferior leads. Initial plan for IM admission and consideration of heart catheterization for Monday. However, patient continued to have chest pain not controlled with SL or IV nitro and decision was made to transport to [facility] cath lab for definitive angiography.    Hospital Course     NSTEMI/CAD/PCI -- Underwent cardiac catheterization noted above with 99% stenosis of mid LAD after large second diagonal treated with PCI/DES x1.  Did have sluggish flow related to distal embolization that was treated with IV Aggrastat, intracoronary verapamil and intracoronary nitroglycerin. hsTn peaked at >24000. Recommendations for DAPT with aspirin/Brilinta initially, with development of new onset atrial fibrillation he was transitioned to monotherapy with Plavix with the addition of Eliquis. -- Continue Plavix, low-dose beta-blocker, atorvastatin 40 mg daily   ICM  -- EF 40-45% on LV gram, follow-up echocardiogram showed LVEF of 40 to 45%, moderate LVH, severe hypokinesis of the LV, mid apical anterior wall and anterior septal wall.  Normal RV size and function with small pericardial effusion. --Continue  low-dose carvedilol 3.125 mg twice daily, Jardiance and spironolactone 12.5 mg daily -- Consider addition of ARB/ARNI at outpatient follow-up pending blood pressure  Hypertension -- BP stable with the addition of Coreg 3.125 mg twice daily as well as spironolactone 12.5 mg daily   Hyperlipidemia -- LP(a) 115, LDL 57, HDL 44 -- Atorvastatin was increased to 40 mg daily this admission -- Enrolled in the EVOLVE trial -- Will need LFT/FLP in 8 weeks   Hypokalemia  -- K+ 3.3, supplemented -- Discharged on oral potassium 20 mg daily -- BMET at follow up appt   New onset atrial fibrillation -- successfully, cardioverted on  IV amiodarone -- Plan for at least 1 month of amiodarone completing the standard loading dosing.  Plan for 400 mg twice daily to complete 5  days, then reduce to 200 mg twice daily for 5 days and then reduce to 200 mg daily maintenance for 1 month -- Roughly 2 to 3 weeks following discontinuation of amiodarone, would order 14-day Zio patch monitor to determine A-fib burden -- Started on Eliquis 5 mg twice daily with CHA2DS2-VASc score 4.  Patient was seen by Dr. Ellyn Hack and deemed stable for discharge.  Follow-up in the office arranged.  Medication sent to [facility].  Educated by [employer].D. prior to discharge. => Dr. Tamala Julian was the performing physician for his MI related PCI.  If possible it would be nice for him to have a follow-up visit with Dr. Tamala Julian, but with his impending retirement, for long-term follow-up care, the patient is requested to follow-up with Dr. Ellyn Hack.  Did the patient have an acute coronary syndrome (MI, NSTEMI, STEMI, etc) this admission?:  Yes                               AHA/ACC Clinical Performance & Quality Measures: Aspirin prescribed? - Yes ADP Receptor Inhibitor (Plavix/Clopidogrel, Brilinta/Ticagrelor or Effient/Prasugrel) prescribed (includes medically managed patients)? - Yes Beta Blocker prescribed? - Yes High Intensity Statin (Lipitor 40-80mg  or Crestor 20-40mg ) prescribed? - Yes EF assessed during THIS hospitalization? - Yes For EF <40%, was ACEI/ARB prescribed? - Not Applicable (EF >/= 74%) For EF <40%, Aldosterone Antagonist (Spironolactone or Eplerenone) prescribed? - Yes Cardiac Rehab Phase II ordered (including medically managed patients)? - Yes       The patient will be scheduled for a TOC follow up appointment in 10-14 days.  A message has been sent to the [facility] and Scheduling Pool at the office where the patient should be seen for follow up.  _____________  Discharge Vitals Blood pressure 139/63, pulse 60, temperature 98.3 F (36.8 C), temperature source Oral, resp. rate 13, height 6\' 4"  (1.93 m), weight 104.5 kg, SpO2 97 %.  Filed Weights   10/30/21 1026 11/02/21 0500  Weight:  102.1 kg 104.5 kg    Physical Exam    GEN: Well-nourished, nonambulatory.  No acute distress. Neck: No JVD or bruit Cardiac: RRR, normal S1 and S2.  No M/R/G.  Back in sinus rhythm. Respiratory: CTA B, nonlabored, good air movement. GI: Soft, nontender nondistended.  Normoactive bowel sounds. MS: No clubbing cyanosis or edema.  Radial cath site stable.  Left antecubital IV had been bleeding. Neuro:  Nonfocal  Psych: Normal affect     Labs & Radiologic Studies    CBC Recent Labs    10/31/21 0232 11/02/21 0509  WBC 10.6* 7.5  HGB 14.8 13.8  HCT 41.4 39.1  MCV 89.2  90.5  PLT 152 915*   Basic Metabolic Panel Recent Labs    11/01/21 0729 11/02/21 0509  NA 141 139  K 3.9 3.3*  CL 102 104  CO2 25 24  GLUCOSE 128* 108*  BUN 11 12  CREATININE 1.24 1.15  CALCIUM 8.9 8.9  MG  --  2.0   Liver Function Tests No results for input(s): "AST", "ALT", "ALKPHOS", "BILITOT", "PROT", "ALBUMIN" in the last 72 hours.  No results for input(s): "LIPASE", "AMYLASE" in the last 72 hours.  High Sensitivity Troponin:   Recent Labs  Lab 10/30/21 1035 10/30/21 1232 10/30/21 1430 10/30/21 1836  TROPONINIHS 3,857* 7,362* 10,586* >24,000*    BNP Invalid input(s): "POCBNP" D-Dimer No results for input(s): "DDIMER" in the last 72 hours. Hemoglobin A1C Recent Labs    10/31/21 0232  HGBA1C 5.1   Fasting Lipid Panel Recent Labs    10/31/21 0232  CHOL 122  HDL 44  LDLCALC 57  TRIG 106  CHOLHDL 2.8   Thyroid Function Tests No results for input(s): "TSH", "T4TOTAL", "T3FREE", "THYROIDAB" in the last 72 hours.  Invalid input(s): "FREET3" _____________  ECHOCARDIOGRAM COMPLETE  Result Date: 11/01/2021    ECHOCARDIOGRAM REPORT   Patient Name:   Alex Arias Date of Exam: 11/01/2021 Medical Rec #:  [id number]   Height:       76.0 in Accession #:    [id number]  Weight:       225.0 lb Date of Birth:  August 02, 1955   BSA:          2.329 m Patient Age:    54 years    BP:            111/76 mmHg Patient Gender: M           HR:           123 bpm. Exam Location:  Inpatient Procedure: 2D Echo, Pediatric Echo, Color Doppler and Strain Analysis Indications:    Acute myocardial infarction, unspecified I21.9  History:        Patient has no prior history of Echocardiogram examinations. CAD                 and Acute MI.  Sonographer:    Darlina Sicilian RDCS Referring Phys: Daneen Schick, W IMPRESSIONS  1. Left ventricular ejection fraction, by estimation, is 40 to 45%. The left ventricle has mildly decreased function. The left ventricle demonstrates regional wall motion abnormalities (see scoring diagram/findings for description). There is moderate left ventricular hypertrophy. Left ventricular diastolic function could not be evaluated. There is severe hypokinesis of the left ventricular, mid-apical anterior wall, anteroseptal wall and apical segment.  2. Right ventricular systolic function is normal. The right ventricular size is normal.  3. A small pericardial effusion is present. The pericardial effusion is circumferential.  4. The mitral valve is grossly normal. Trivial mitral valve regurgitation.  5. The aortic valve is tricuspid. Aortic valve regurgitation is not visualized. Comparison(s): No prior Echocardiogram. FINDINGS  Left Ventricle: Left ventricular ejection fraction, by estimation, is 40 to 45%. The left ventricle has mildly decreased function. The left ventricle demonstrates regional wall motion abnormalities. Severe hypokinesis of the left ventricular, mid-apical  anterior wall, anteroseptal wall and apical segment. The left ventricular internal cavity size was normal in size. There is moderate left ventricular hypertrophy. Left ventricular diastolic function could not be evaluated due to atrial fibrillation. Left ventricular diastolic function could not be evaluated. Right Ventricle: The right ventricular size is normal. No increase  in right ventricular wall thickness. Right ventricular  systolic function is normal. Left Atrium: Left atrial size was normal in size. Right Atrium: Right atrial size was normal in size. Pericardium: A small pericardial effusion is present. The pericardial effusion is circumferential. The pericardial effusion appears to contain mixed echogenic material. Mitral Valve: The mitral valve is grossly normal. Trivial mitral valve regurgitation. Tricuspid Valve: The tricuspid valve is not well visualized. Tricuspid valve regurgitation is not demonstrated. Aortic Valve: The aortic valve is tricuspid. Aortic valve regurgitation is not visualized. Pulmonic Valve: The pulmonic valve was normal in structure. Pulmonic valve regurgitation is not visualized. Aorta: The aortic root and ascending aorta are structurally normal, with no evidence of dilitation. Venous: The inferior vena cava was not well visualized. IAS/Shunts: No atrial level shunt detected by color flow Doppler.  LEFT VENTRICLE PLAX 2D LVIDd:         4.00 cm LVIDs:         2.80 cm LV PW:         1.50 cm LV IVS:        1.40 cm LVOT diam:     2.20 cm   3D Volume EF: LV SV:         52        3D EF:        49 % LV SV Index:   23        LV EDV:       142 ml LVOT Area:     3.80 cm  LV ESV:       73 ml                          LV SV:        69 ml LEFT ATRIUM             Index        RIGHT ATRIUM           Index LA diam:        3.60 cm 1.55 cm/m   RA Area:     19.00 cm LA Vol (A2C):   46.0 ml 19.75 ml/m  RA Volume:   51.50 ml  22.11 ml/m LA Vol (A4C):   48.2 ml 20.70 ml/m LA Biplane Vol: 47.9 ml 20.57 ml/m  AORTIC VALVE LVOT Vmax:   90.10 cm/s LVOT Vmean:  57.900 cm/s LVOT VTI:    0.138 m  AORTA Ao Root diam: 3.60 cm Ao Asc diam:  3.65 cm  SHUNTS Systemic VTI:  0.14 m Systemic Diam: 2.20 cm Lyman Bishop MD Electronically signed by Lyman Bishop MD Signature Date/Time: 11/01/2021/5:57:48 PM    Final    CARDIAC CATHETERIZATION  Result Date: 10/30/2021 CONCLUSIONS: 99% stenosis of the mid LAD after the large second  diagonal.  TIMI grade I flow was noted in the vessel.  The vessel was treated with angioplasty and stenting using a 3.0 x 16 Synergy reducing the stenosis to 0% with improvement in TIMI flow to 2.5.  Sluggish flow was related to distal embolization and was treated with IV Aggrastat, IC verapamil, and nitroglycerin intracoronary. Right dominant anatomy without significant obstruction. Left main is widely patent. Circumflex is small and widely patent. Mid to apical anterior wall severe hypokinesis.  EF 40 to 50%.  LVEDP greater than 22 mmHg. RECOMMENDATIONS: Preventive therapy with high intensity statin, beta-blocker, and ACE inhibitor therapy if tolerated. Dual antiplatelet therapy with aspirin and Brilinta.  Should continue high intensity antiplatelet treatment for at least 6 months and then perhaps Brilinta monotherapy or converting to clopidogrel monotherapy could be considered. Patient will need a 2D Doppler echocardiogram.   CT Angio Chest/Abd/Pel for Dissection W and/or Wo Contrast  Result Date: 10/30/2021 CLINICAL DATA:  Patient reports some diarrhea last night, then developing projectile vomiting. Epigastric and chest pain, nonradiating. Distended abdomen. EXAM: CT ANGIOGRAPHY CHEST, ABDOMEN AND PELVIS TECHNIQUE: Multidetector CT imaging through the chest, abdomen and pelvis was performed using the standard protocol during bolus administration of intravenous contrast. Multiplanar reconstructed images and MIPs were obtained and reviewed to evaluate the vascular anatomy. RADIATION DOSE REDUCTION: This exam was performed according to the departmental dose-optimization program which includes automated exposure control, adjustment of the mA and/or kV according to patient size and/or use of iterative reconstruction technique. CONTRAST:  145mL OMNIPAQUE IOHEXOL 350 MG/ML SOLN COMPARISON:  09/24/2020 and 05/23/2019, CT abdomen and pelvis. FINDINGS: CTA CHEST FINDINGS Cardiovascular: Thoracic aorta is normal in  caliber. No dissection. Mild atherosclerosis. Arch branch vessels are widely patent. Heart is normal in size and configuration. Mild left and right coronary artery calcifications. No pericardial effusion. Mediastinum/Nodes: No enlarged mediastinal, hilar, or axillary lymph nodes. Thyroid gland, trachea, and esophagus demonstrate no significant findings. Lungs/Pleura: Lungs are clear. No pleural effusion or pneumothorax. Musculoskeletal: No fracture or acute finding. No bone lesion. No chest wall mass. Review of the MIP images confirms the above findings. CTA ABDOMEN AND PELVIS FINDINGS VASCULAR Aorta: Atherosclerosis with no significant stenosis. No dissection. No evidence of a penetrating ulcer or mural hematoma. Celiac: Patent without evidence of aneurysm, dissection, vasculitis or significant stenosis. SMA: Mild atherosclerotic plaque at the origin, without significant stenosis. No aneurysm or dissection. Renals: Both renal arteries are patent without evidence of aneurysm, dissection, vasculitis, fibromuscular dysplasia or significant stenosis. IMA: Patent without evidence of aneurysm, dissection, vasculitis or significant stenosis. Inflow: Patent without evidence of aneurysm, dissection, vasculitis or significant stenosis. Veins: No obvious venous abnormality within the limitations of this arterial phase study. Review of the MIP images confirms the above findings. NON-VASCULAR Hepatobiliary: No focal liver abnormality is seen. No gallstones, gallbladder wall thickening, or biliary dilatation. Pancreas: Unremarkable. No pancreatic ductal dilatation or surrounding inflammatory changes. Spleen: Normal in size without focal abnormality. Adrenals/Urinary Tract: No adrenal mass. Mild renal cortical thinning. 1 cm low-density mass, posterior upper pole the left kidney, consistent with a cyst and unchanged, no follow-up recommended. No other renal masses, no stones and no hydronephrosis. Normal ureters. Normal bladder.  Stomach/Bowel: Moderately distended stomach, otherwise unremarkable. Small bowel and colon are normal in caliber. No wall thickening. No inflammation. Numerous colonic diverticula without diverticulitis. Normal appendix visualized. Lymphatic: No enlarged lymph nodes. Reproductive: Mildly enlarged prostate, 5.2 x 4.2 cm transversely. Other: Small fat containing umbilical hernia.  No ascites. Musculoskeletal: No fracture.  No bone lesion. Review of the MIP images confirms the above findings. IMPRESSION: CTA 1. No aortic aneurysm or dissection. No findings of acute aortic syndrome. 2. Aortic branch vessels are patent, with no hemodynamically significant stenosis. NON CTA 1. No acute findings within the chest, abdomen or pelvis. 2. Mild left and right coronary artery calcifications. Electronically Signed   By: Lajean Manes M.D.   On: 10/30/2021 12:11   Disposition   Pt is being discharged home today in good condition.  Follow-up Plans & Appointments     Follow-up Information     Elgie Collard, PA-C Follow up on 11/09/2021.   Specialty: Cardiology Why: at  2:20pm for your follow up with Dr. Darliss Ridgel' [place] information: [address] [place] [address] [phone]                Discharge Instructions     Amb Referral to Cardiac Rehabilitation   Complete by: As directed    Diagnosis:  Coronary Stents PTCA NSTEMI     After initial evaluation and assessments completed: Virtual Based Care may be provided alone or in conjunction with Phase 2 Cardiac Rehab based on patient barriers.: Yes   Intensive Cardiac Rehabilitation (ICR) [place] location only OR Traditional Cardiac Rehabilitation (TCR) *If criteria for ICR are not met will enroll in TCR [facility] only): Yes   Call MD for:  difficulty breathing, headache or visual disturbances   Complete by: As directed    Call MD for:  persistant dizziness or light-headedness   Complete by: As directed    Call MD for:  redness,  tenderness, or signs of infection (pain, swelling, redness, odor or green/yellow discharge around incision site)   Complete by: As directed    Diet - low sodium heart healthy   Complete by: As directed    Discharge instructions   Complete by: As directed    Radial Site Care Refer to this sheet in the next few weeks. These instructions provide you with information on caring for yourself after your procedure. Your caregiver may also give you more specific instructions. Your treatment has been planned according to current medical practices, but problems sometimes occur. Call your caregiver if you have any problems or questions after your procedure. HOME CARE INSTRUCTIONS You may shower the day after the procedure. Remove the bandage (dressing) and gently wash the site with plain soap and water. Gently pat the site dry.  Do not apply powder or lotion to the site.  Do not submerge the affected site in water for 3 to 5 days.  Inspect the site at least twice daily.  Do not flex or bend the affected arm for 24 hours.  No lifting over 5 pounds (2.3 kg) for 5 days after your procedure.  Do not drive home if you are discharged the same day of the procedure. Have someone else drive you.  You may drive 24 hours after the procedure unless otherwise instructed by your caregiver.  What to expect: Any bruising will usually fade within 1 to 2 weeks.  Blood that collects in the tissue (hematoma) may be painful to the touch. It should usually decrease in size and tenderness within 1 to 2 weeks.  SEEK IMMEDIATE MEDICAL CARE IF: You have unusual pain at the radial site.  You have redness, warmth, swelling, or pain at the radial site.  You have drainage (other than a small amount of blood on the dressing).  You have chills.  You have a fever or persistent symptoms for more than 72 hours.  You have a fever and your symptoms suddenly get worse.  Your arm becomes pale, cool, tingly, or numb.  You have heavy  bleeding from the site. Hold pressure on the site.   PLEASE DO NOT MISS ANY DOSES OF YOUR PLAVIX!!!!! Also keep a log of you blood pressures and bring back to your follow up appt. Please call the office with any questions.   Patients taking blood thinners should generally stay away from medicines like ibuprofen, Advil, Motrin, naproxen, and Aleve due to risk of stomach bleeding. You may take Tylenol as directed or talk to your  primary doctor about alternatives.   PLEASE ENSURE THAT YOU DO NOT RUN OUT OF YOUR PLAVIX. This medication is very important to remain on for at least one year. IF you have issues obtaining this medication due to cost please CALL the office 3-5 business days prior to running out in order to prevent missing doses of this medication.   Increase activity slowly   Complete by: As directed         Discharge Medications   Allergies as of 11/02/2021   No Known Allergies      Medication List     STOP taking these medications    olmesartan-hydrochlorothiazide 20-12.5 MG tablet Commonly known as: BENICAR HCT       TAKE these medications    amiodarone 200 MG tablet Commonly known as: PACERONE Take 2 tablets (400 mg total) by mouth 2 (two) times daily for 5 days, THEN 1 tablet (200 mg total) 2 (two) times daily for 5 days, THEN 1 tablet (200 mg total) daily. Start taking on: November 02, 2021   atorvastatin 40 MG tablet Commonly known as: LIPITOR Take 1 tablet (40 mg total) by mouth daily. Start taking on: November 03, 2021 What changed:  medication strength how much to take   bismuth subsalicylate 491 PH/15AV suspension Commonly known as: PEPTO BISMOL Take 30 mLs by mouth every 6 (six) hours as needed. For stomach upset   carvedilol 3.125 MG tablet Commonly known as: COREG Take 1 tablet (3.125 mg total) by mouth 2 (two) times daily with a meal.   clopidogrel 75 MG tablet Commonly known as: PLAVIX Take 1 tablet (75 mg total) by mouth daily.    Eliquis 5 MG Tabs tablet Generic drug: apixaban Take 1 tablet (5 mg total) by mouth 2 (two) times daily.   EVOLVE-MI evolocumab 140 mg/1 mL SQ injection Inject 140 mg into the skin every 14 (fourteen) days. For Investigational Use Only. Inject subcutaneously into abdomen, thigh, or upper arm every 14 days. Rotate injection sites and do not inject into areas where skin is tender, bruised, or red. Please contact [place] for any questions or concerns regarding this medication.   Jardiance 10 MG Tabs tablet Generic drug: empagliflozin Take 1 tablet (10 mg total) by mouth daily. Start taking on: November 03, 2021   levothyroxine 88 MCG tablet Commonly known as: SYNTHROID Take 88 mcg by mouth daily.   multivitamin with minerals Tabs tablet Take 1 tablet by mouth daily.   nitroGLYCERIN 0.4 MG SL tablet Commonly known as: NITROSTAT Place 1 tablet (0.4 mg total) under the tongue every 5 (five) minutes as needed for chest pain.   pantoprazole 40 MG tablet Commonly known as: PROTONIX Take 40 mg by mouth daily.   potassium chloride SA 20 MEQ tablet Commonly known as: KLOR-CON M Take 1 tablet (20 mEq total) by mouth daily.   spironolactone 25 MG tablet Commonly known as: ALDACTONE Take 1/2 tablet (12.5 mg total) by mouth daily. Start taking on: November 03, 2021           Outstanding Labs/Studies   BMET at follow up FLP/LFTs in 8 weeks   Duration of Discharge Encounter   Greater than 30 minutes including physician time.  Signed, Reino Bellis, NP 11/02/2021, 1:36 PM    ATTENDING ATTESTATION  I have seen, examined and evaluated the patient prior to discharge along with Reino Bellis, NP.  Please see final progress note for full details. After reviewing all the available data and chart,  we discussed the patients laboratory, study & physical findings as well as symptoms in detail.   I agree with the findings, examinations, summary as well as  impression and recommendations noted above.  Attending adjustments noted in italics.  Full details in final progress note.  His first follow-up will be with Nicholes Rough, PA at the end of the month.  I suspect that he may be able to see Dr. Tamala Julian for an initial MD follow-up after his STEMI, however in the interest of continuity of care, with Dr. Thompson Caul upcoming retirement would recommend that he would then follow-up with me (Dr. Ellyn Hack) upon Dr. Tamala Julian retiring.Leonie Man, MD, MS Glenetta Hew, M.D., M.S. Interventional Cardiologist  [place]  Pager # [phone] Phone # [phone] [address] [place], [place] [address]

## 2021-11-01 NOTE — Progress Notes (Signed)
Rounding Note    Patient Name: Alex Arias Date of Encounter: 11/01/2021  [place] Cardiologist: Sinclair Grooms, MD   Patient Profile     66 y.o. male with history of bilateral inguinal hernias with incarcerated right hernia, HTN, fibromuscular dysplasia of renal artery who presented with chest pain. It started out last night with N/V/D and then had epigastric pain that radiated to his back.  With + Troponin, borderline ischemic EKG changes & ongoing pain, he was taken urgently to the [facility] lab as an Urgent NSTEMI - Cath with 99% subTO of LAD => DES PCI; pathophysiology consistent with aborted STEMI.  EF 40-45% (LAD WMA) - Echo pending.   Assessment & Plan    Principal Problem:   NSTEMI (non-ST elevated myocardial infarction) ([place]) - + Troponin with Anterior & Inferior ST depressions => ongoing CP => Urgent Cath; hsTroponin > 24 K Active Problems:   Coronary artery disease involving native coronary artery of native heart with unstable angina pectoris (HCC)   Presence of drug coated stent in LAD coronary artery   Acute combined systolic and diastolic heart failure (HCC) - NSTEMI, LAD sub TO => LVEDP 21-25 mmHg   Essential hypertension   Hyperlipidemia with target LDL less than 70   Hypokalemia  NSTEMI/CAD/PCI:  DAPT - ASA/Brilinta x min 6 months, then could d/c ASA & continue Brilinta monotherapy to complete year 1.  On Atorvastatin 40 mg (along with Repatha -- > can probably reduce statin at 2nd OP f/u) ICM: Echo STILL pending - EF 40-45% on LV Gram -- add Coreg low dose now consider SGLT-2I & if BP would tolerate,  ARB/ARNI (was on Olmesartan @ home) pending result of BB initiation. - BP not likely to tolerate at this point - reassess in OP f/u If Echo shows EF at least 45% or higher, can consider d/c this PM.   BP stable, but with ICM, freq PVCs & AIVR, - initiated  Coreg 3.125 mg BID on 10/16, tolerating  Current BP in 100-110s - so will hold off on re-starting  ARB& d/c home HCTZ. Would restart ARB in OP if BP tolerates.   Lipids reviewed - @ goal (but need to reassess in 3 months) was on low dose Atorvastatin 20 mg => . will increase to at least 40mg   for next several months, but probably reduce by ~3 months depending upon levels.  Has been enrolled in the EVOLVE trial HypoKalemia - 3.9 today - no need for additional supplementation  DISPO: Doing well - pending Echo Evaluation to ensure stable EF & no structural abnormalities.   If stable -- would consider d/c  Family had questions about his snoring and nurses noting apnea on monitor.  He had an outpatient sleep study pending that had to be canceled I think because of this hospitalization.  If is not able to be rescheduled per PCP, would try to arrange an first hospital follow-up with APP..  =================================================================== Subjective   Feels well - no further CP.  No sensation of palpitations or arrhythmia  Has not yet walked  Inpatient Medications    Scheduled Meds:  aspirin  81 mg Oral Daily   atorvastatin  40 mg Oral Daily   carvedilol  3.125 mg Oral BID WC   Chlorhexidine Gluconate Cloth  6 each Topical Daily   heparin  5,000 Units Subcutaneous Q8H   sodium chloride flush  3 mL Intravenous Q12H   ticagrelor  90 mg Oral BID  Continuous Infusions:  sodium chloride     PRN Meds: sodium chloride, acetaminophen, nitroGLYCERIN, ondansetron (ZOFRAN) IV, mouth rinse, oxyCODONE, sodium chloride flush   Vital Signs    Vitals:   11/01/21 0700 11/01/21 0800 11/01/21 0843 11/01/21 0900  BP: 108/80  107/76 107/72  Pulse: 70  74 70  Resp: 18  17 16   Temp:  98.9 F (37.2 C)    TempSrc:  Oral    SpO2: 93% 95% 96% 95%  Weight:      Height:        Intake/Output Summary (Last 24 hours) at 11/01/2021 1041 Last data filed at 11/01/2021 0300 Gross per 24 hour  Intake 670 ml  Output 700 ml  Net -30 ml      10/30/2021   10:26 AM 05/23/2019    7:48  AM 06/25/2015    8:25 AM  Last 3 Weights  Weight (lbs) 225 lb 225 lb 215 lb  Weight (kg) 102.059 kg 102.059 kg 97.523 kg     Lab Results  Component Value Date   CHOL 122 10/31/2021   HDL 44 10/31/2021   LDLCALC 57 10/31/2021   TRIG 106 10/31/2021   CHOLHDL 2.8 10/31/2021    Telemetry    SR 80s, PVCs, AIVR - Personally Reviewed  ECG    No study today Personally Reviewed  Cardiac Studies   Cath-PCI 10/30/2021: 99% thrombotic SubTO of mLAD just after D2 -> DES PCI 3.0 x 16 Synergy XD => deployed to 3.0 mm.  2 big Diags, LCx-OM1 & Large Dominant RCA - rPDA & PAV-RPL1-3 free of disease. EF ~40-45%, LVEDP 21--25 mmHg mid-apical anterior & apical HK.  Physical Exam   GEN: No acute distress.  Healthy-appearing.  Well-groomed. Neck: No JVD or buit Cardiac: RRR, normal S1 and S2.  No M/R/G.  No edema. Respiratory: CTA B, nonlabored.  Good air movement.  GI: Soft/NT/ND; NABS MS: No C/C/E.  Radial cath site clean dry and intact with normal [place].  Neuro:  Nonfocal  Psych: Normal affect   Labs    High Sensitivity Troponin:   Recent Labs  Lab 10/30/21 1035 10/30/21 1232 10/30/21 1430 10/30/21 1836  TROPONINIHS 3,857* 7,362* 10,586* >24,000*     Chemistry Recent Labs  Lab 10/30/21 1035 10/30/21 1836 10/31/21 0232 11/01/21 0729  NA 140  --  137 141  K 3.5  --  3.5 3.9  CL 102  --  106 102  CO2 24  --  22 25  GLUCOSE 172*  --  132* 128*  BUN 18  --  15 11  CREATININE 1.17 1.13 1.10 1.24  CALCIUM 9.7  --  8.4* 8.9  PROT 7.7  --   --   --   ALBUMIN 4.7  --   --   --   AST 73*  --   --   --   ALT 41  --   --   --   ALKPHOS 53  --   --   --   BILITOT 1.1  --   --   --   GFRNONAA >60 >60 >60 >60  ANIONGAP 14  --  9 14    Lipids  Recent Labs  Lab 10/31/21 0232  CHOL 122  TRIG 106  HDL 44  LDLCALC 57  CHOLHDL 2.8    Hematology Recent Labs  Lab 10/30/21 1035 10/30/21 1836 10/31/21 0232  WBC 11.7* 12.2* 10.6*  RBC 5.09 4.85 4.64  HGB 16.3 15.4 14.8   HCT  45.2 43.8 41.4  MCV 88.8 90.3 89.2  MCH 32.0 31.8 31.9  MCHC 36.1* 35.2 35.7  RDW 13.5 13.5 13.7  PLT 170 173 152   Thyroid No results for input(s): "TSH", "FREET4" in the last 168 hours.  BNPNo results for input(s): "BNP", "PROBNP" in the last 168 hours.  DDimer No results for input(s): "DDIMER" in the last 168 hours.   Radiology    CARDIAC CATHETERIZATION  Result Date: 10/30/2021 CONCLUSIONS: 99% stenosis of the mid LAD after the large second diagonal.  TIMI grade I flow was noted in the vessel.  The vessel was treated with angioplasty and stenting using a 3.0 x 16 Synergy reducing the stenosis to 0% with improvement in TIMI flow to 2.5.  Sluggish flow was related to distal embolization and was treated with IV Aggrastat, IC verapamil, and nitroglycerin intracoronary. Right dominant anatomy without significant obstruction. Left main is widely patent. Circumflex is small and widely patent. Mid to apical anterior wall severe hypokinesis.  EF 40 to 50%.  LVEDP greater than 22 mmHg. RECOMMENDATIONS: Preventive therapy with high intensity statin, beta-blocker, and ACE inhibitor therapy if tolerated. Dual antiplatelet therapy with aspirin and Brilinta.  Should continue high intensity antiplatelet treatment for at least 6 months and then perhaps Brilinta monotherapy or converting to clopidogrel monotherapy could be considered. Patient will need a 2D Doppler echocardiogram.   CT Angio Chest/Abd/Pel for Dissection W and/or Wo Contrast  Result Date: 10/30/2021 CLINICAL DATA:  Patient reports some diarrhea last night, then developing projectile vomiting. Epigastric and chest pain, nonradiating. Distended abdomen. EXAM: CT ANGIOGRAPHY CHEST, ABDOMEN AND PELVIS TECHNIQUE: Multidetector CT imaging through the chest, abdomen and pelvis was performed using the standard protocol during bolus administration of intravenous contrast. Multiplanar reconstructed images and MIPs were obtained and reviewed to  evaluate the vascular anatomy. RADIATION DOSE REDUCTION: This exam was performed according to the departmental dose-optimization program which includes automated exposure control, adjustment of the mA and/or kV according to patient size and/or use of iterative reconstruction technique. CONTRAST:  133mL OMNIPAQUE IOHEXOL 350 MG/ML SOLN COMPARISON:  09/24/2020 and 05/23/2019, CT abdomen and pelvis. FINDINGS: CTA CHEST FINDINGS Cardiovascular: Thoracic aorta is normal in caliber. No dissection. Mild atherosclerosis. Arch branch vessels are widely patent. Heart is normal in size and configuration. Mild left and right coronary artery calcifications. No pericardial effusion. Mediastinum/Nodes: No enlarged mediastinal, hilar, or axillary lymph nodes. Thyroid gland, trachea, and esophagus demonstrate no significant findings. Lungs/Pleura: Lungs are clear. No pleural effusion or pneumothorax. Musculoskeletal: No fracture or acute finding. No bone lesion. No chest wall mass. Review of the MIP images confirms the above findings. CTA ABDOMEN AND PELVIS FINDINGS VASCULAR Aorta: Atherosclerosis with no significant stenosis. No dissection. No evidence of a penetrating ulcer or mural hematoma. Celiac: Patent without evidence of aneurysm, dissection, vasculitis or significant stenosis. SMA: Mild atherosclerotic plaque at the origin, without significant stenosis. No aneurysm or dissection. Renals: Both renal arteries are patent without evidence of aneurysm, dissection, vasculitis, fibromuscular dysplasia or significant stenosis. IMA: Patent without evidence of aneurysm, dissection, vasculitis or significant stenosis. Inflow: Patent without evidence of aneurysm, dissection, vasculitis or significant stenosis. Veins: No obvious venous abnormality within the limitations of this arterial phase study. Review of the MIP images confirms the above findings. NON-VASCULAR Hepatobiliary: No focal liver abnormality is seen. No gallstones,  gallbladder wall thickening, or biliary dilatation. Pancreas: Unremarkable. No pancreatic ductal dilatation or surrounding inflammatory changes. Spleen: Normal in size without focal abnormality. Adrenals/Urinary Tract: No adrenal mass. Mild  renal cortical thinning. 1 cm low-density mass, posterior upper pole the left kidney, consistent with a cyst and unchanged, no follow-up recommended. No other renal masses, no stones and no hydronephrosis. Normal ureters. Normal bladder. Stomach/Bowel: Moderately distended stomach, otherwise unremarkable. Small bowel and colon are normal in caliber. No wall thickening. No inflammation. Numerous colonic diverticula without diverticulitis. Normal appendix visualized. Lymphatic: No enlarged lymph nodes. Reproductive: Mildly enlarged prostate, 5.2 x 4.2 cm transversely. Other: Small fat containing umbilical hernia.  No ascites. Musculoskeletal: No fracture.  No bone lesion. Review of the MIP images confirms the above findings. IMPRESSION: CTA 1. No aortic aneurysm or dissection. No findings of acute aortic syndrome. 2. Aortic branch vessels are patent, with no hemodynamically significant stenosis. NON CTA 1. No acute findings within the chest, abdomen or pelvis. 2. Mild left and right coronary artery calcifications. Electronically Signed   By: Lajean Manes M.D.   On: 10/30/2021 12:11       For questions or updates, please contact [place] Please consult [email or web address] for contact info under        Signed, Glenetta Hew, MD  11/01/2021, 10:41 AM

## 2021-11-01 NOTE — Progress Notes (Addendum)
   Received call from RN regarding patient converting to Afib this afternoon. EKG confirms new onset afib. Given need for DOAC, will transition to plavix this evening with 300mg  load, then 75mg  daily starting tomorrow. Start IV amiodarone with 150mg  bolus with attempts to convert. Start Eliquis 5mg  BID this evening.   SignedReino Bellis, NP-C 11/01/2021, 3:45 PM Pager: [phone] .  Timing was roughly the 1:30 in the afternoon when he went in A-fib on telemetry. I personally went to talk to the patient about the new A-fib and plans going forward.  Patient and wife are appreciative and understanding.. Obviously, this will delay discharge.   Glenetta Hew, MD

## 2021-11-01 NOTE — Care Management (Signed)
  Transition of Care [facility]) Screening Note   Patient Details  Name: Alex Arias Date of Birth: Apr 24, 1955   [facility] CM/SW Contact:    Bethena Roys, RN Phone Number: 11/01/2021, 4:33 PM    Transition of Care Department [facility]) has reviewed the patient and no TOC needs have been identified at this time. We will continue to monitor patient advancement through interdisciplinary progression rounds. If new patient transition needs arise, please place a TOC consult.

## 2021-11-01 NOTE — Telephone Encounter (Signed)
Patient Advocate Encounter  Prior Authorization for Jardiance 10MG  tablets has been approved.    PA# [id number] Key: [id number] Effective dates: 11/01/2021 through 11/01/2022  Patients co-pay is $40.00.     Lyndel Safe, [place] Patient Advocate Specialist [place] Patient Advocate Team Direct Number: [phone]  Fax: [phone]

## 2021-11-01 NOTE — TOC Benefit Eligibility Note (Signed)
Patient Teacher, English as a foreign language completed.    The patient is currently admitted and upon discharge could be taking Eliquis 5 mg.  The current 30 day co-pay is $40.00.   The patient is insured through [place], [place] Patient [place] Patient Advocate Team Direct Number: [phone]  Fax: [phone]

## 2021-11-01 NOTE — Progress Notes (Addendum)
Pt is ambulating independently w/o chest pain or sob. Pt reported that he walked 3 laps at 0600. Pt received MI book and was educated on stent location, Birlinta and Aspirin med, risk factotrs, restrictions, ex guidelines, NTG use,, heart healthy diet, and CRPII. Pt was referred to [facility].  [phone]  Alex Arias 8:43 AM 11/01/2021

## 2021-11-01 NOTE — Progress Notes (Signed)
  Echocardiogram 2D Echocardiogram has been performed.  Darlina Sicilian M 11/01/2021, 3:04 PM

## 2021-11-02 ENCOUNTER — Other Ambulatory Visit (HOSPITAL_COMMUNITY): Payer: Self-pay

## 2021-11-02 ENCOUNTER — Encounter (HOSPITAL_COMMUNITY): Payer: Self-pay | Admitting: Cardiology

## 2021-11-02 LAB — BASIC METABOLIC PANEL
Anion gap: 11 (ref 5–15)
BUN: 12 mg/dL (ref 8–23)
CO2: 24 mmol/L (ref 22–32)
Calcium: 8.9 mg/dL (ref 8.9–10.3)
Chloride: 104 mmol/L (ref 98–111)
Creatinine, Ser: 1.15 mg/dL (ref 0.61–1.24)
GFR, Estimated: >60 mL/min (ref >60)
Glucose, Bld: 108 mg/dL — ABNORMAL HIGH (ref 70–99)
Potassium: 3.3 mmol/L — ABNORMAL LOW (ref 3.5–5.1)
Sodium: 139 mmol/L (ref 135–145)

## 2021-11-02 LAB — CBC
HCT: 39.1 % (ref 39.0–52.0)
Hemoglobin: 13.8 g/dL (ref 13.0–17.0)
MCH: 31.9 pg (ref 26.0–34.0)
MCHC: 35.3 g/dL (ref 30.0–36.0)
MCV: 90.5 fL (ref 80.0–100.0)
Platelets: 137 10*3/uL — ABNORMAL LOW (ref 150–400)
RBC: 4.32 MIL/uL (ref 4.22–5.81)
RDW: 13.4 % (ref 11.5–15.5)
WBC: 7.5 10*3/uL (ref 4.0–10.5)
nRBC: 0 % (ref 0.0–0.2)

## 2021-11-02 LAB — MAGNESIUM: Magnesium: 2 mg/dL (ref 1.7–2.4)

## 2021-11-02 MED ORDER — POTASSIUM CHLORIDE CRYS ER 20 MEQ PO TBCR
20.0000 meq | EXTENDED_RELEASE_TABLET | Freq: Every day | ORAL | 0 refills | Status: DC
  Filled 2021-11-02: qty 60, 60d supply, fill #0

## 2021-11-02 MED ORDER — EMPAGLIFLOZIN 10 MG PO TABS
10.0000 mg | ORAL_TABLET | Freq: Every day | ORAL | 2 refills | Status: DC
  Filled 2021-11-02: qty 30, 30d supply, fill #0

## 2021-11-02 MED ORDER — SPIRONOLACTONE 25 MG PO TABS
12.5000 mg | ORAL_TABLET | Freq: Every day | ORAL | 1 refills | Status: DC
  Filled 2021-11-02: qty 15, 30d supply, fill #0

## 2021-11-02 MED ORDER — POTASSIUM CHLORIDE CRYS ER 20 MEQ PO TBCR
40.0000 meq | EXTENDED_RELEASE_TABLET | Freq: Two times a day (BID) | ORAL | Status: DC
  Administered 2021-11-02: 40 meq via ORAL
  Filled 2021-11-02: qty 2

## 2021-11-02 MED ORDER — AMIODARONE HCL 200 MG PO TABS
ORAL_TABLET | ORAL | 0 refills | Status: DC
  Filled 2021-11-02: qty 60, 40d supply, fill #0

## 2021-11-02 MED ORDER — AMIODARONE HCL 200 MG PO TABS
400.0000 mg | ORAL_TABLET | Freq: Two times a day (BID) | ORAL | Status: DC
  Administered 2021-11-02: 400 mg via ORAL
  Filled 2021-11-02: qty 2

## 2021-11-02 MED ORDER — SPIRONOLACTONE 12.5 MG HALF TABLET
12.5000 mg | ORAL_TABLET | Freq: Every day | ORAL | Status: DC
  Administered 2021-11-02: 12.5 mg via ORAL
  Filled 2021-11-02: qty 1

## 2021-11-02 MED ORDER — APIXABAN 5 MG PO TABS
5.0000 mg | ORAL_TABLET | Freq: Two times a day (BID) | ORAL | 3 refills | Status: DC
  Filled 2021-11-02: qty 60, 30d supply, fill #0

## 2021-11-02 MED ORDER — POTASSIUM CHLORIDE CRYS ER 20 MEQ PO TBCR
40.0000 meq | EXTENDED_RELEASE_TABLET | Freq: Every day | ORAL | 0 refills | Status: DC
  Filled 2021-11-02: qty 60, 30d supply, fill #0

## 2021-11-02 MED ORDER — CARVEDILOL 3.125 MG PO TABS
3.1250 mg | ORAL_TABLET | Freq: Two times a day (BID) | ORAL | 0 refills | Status: DC
  Filled 2021-11-02: qty 60, 30d supply, fill #0

## 2021-11-02 MED ORDER — NITROGLYCERIN 0.4 MG SL SUBL
0.4000 mg | SUBLINGUAL_TABLET | SUBLINGUAL | 2 refills | Status: AC | PRN
  Filled 2021-11-02: qty 25, 7d supply, fill #0

## 2021-11-02 MED ORDER — CLOPIDOGREL BISULFATE 75 MG PO TABS
75.0000 mg | ORAL_TABLET | Freq: Every day | ORAL | 2 refills | Status: DC
  Filled 2021-11-02: qty 30, 30d supply, fill #0

## 2021-11-02 MED ORDER — ATORVASTATIN CALCIUM 40 MG PO TABS
40.0000 mg | ORAL_TABLET | Freq: Every day | ORAL | 0 refills | Status: AC
  Filled 2021-11-02: qty 30, 30d supply, fill #0

## 2021-11-02 NOTE — Progress Notes (Signed)
Pt declined ambulation due to eating, has stated that he walked this morning w/o cp and sob. Discussed medication switch to plavix and CRPII. Is eager to join CRPII at [facility] 11/02/2021 11:28 AM

## 2021-11-02 NOTE — Progress Notes (Signed)
Heart Failure Navigator Progress Note  Assessed for Heart & Vascular TOC clinic readiness.  Patient does not meet criteria due to patient scheduled for a hospital cardiology follow up on 11/09/21. .   Navigator available for reassessment of patient.   Earnestine Leys, BSN, Clinical cytogeneticist Only

## 2021-11-02 NOTE — Progress Notes (Signed)
Rounding Note    Patient Name: Alex Arias Date of Encounter: 11/02/2021  [facility] Cardiologist: Sinclair Grooms, MD   Patient Profile     66 y.o. male with history of bilateral inguinal hernias with incarcerated right hernia, HTN, fibromuscular dysplasia of renal artery who presented with chest pain. It started out last night with N/V/D and then had epigastric pain that radiated to his back.  With + Troponin, borderline ischemic EKG changes & ongoing pain, he was taken urgently to the [facility] lab as an Urgent NSTEMI - Cath with 99% subTO of LAD => DES PCI; pathophysiology consistent with aborted STEMI.  EF 40-45% (LAD WMA) - Echo pending.   Assessment & Plan    Principal Problem:   NSTEMI (non-ST elevated myocardial infarction) ([place]) - + Troponin with Anterior & Inferior ST depressions => ongoing CP => Urgent Cath; hsTroponin > 24 K Active Problems:   Coronary artery disease involving native coronary artery of native heart with unstable angina pectoris (HCC)   Presence of drug coated stent in LAD coronary artery   Acute combined systolic and diastolic heart failure (HCC) - NSTEMI, LAD sub TO => LVEDP 21-25 mmHg   New onset atrial fibrillation (HCC)   Essential hypertension   Hyperlipidemia with target LDL less than 70   Hypokalemia  NSTEMI/CAD/PCI:  > We will now plan SAPT with Plavix monotherapy in addition to Eliquis. Low-dose beta-blocker along with increased dose of atorvastatin and Repatha. Jardiance 10 mg started   ICM: Echo STILL pending - EF 40-45% on LV Gram --similar findings on echo.  Similar wall motion.  No indication for LifeVest. Coreg low dose now Started on Jardiance along with low-dose spironolactone in addition to carvedilol. Was on olmesartan at home, BP would not tolerate at this point.  Can consider adding in the outpatient setting. ARB/ARNI (was on Olmesartan @ home) pending result of BB initiation. - BP not likely to tolerate at this point -  reassess in OP f/u If Echo shows EF at least 45% or higher, can consider d/c this PM.   BP stable, but with ICM, freq PVCs & AIVR, - initiated  Coreg 3.125 mg BID on 10/16, tolerating  Current BP in 100-110s - so will hold off on re-starting ARB& d/c home HCTZ -> added low-dose spironolactone 12.5 mg daily. Would restart ARB in OP if BP tolerates.   Lipids reviewed - @ goal (but need to reassess in 3 months) was on low dose Atorvastatin 20 mg => . will increase to at least 40mg   for next several months, but probably reduce by ~3 months depending upon levels.  Has been enrolled in the EVOLVE trial  HypoKalemia -3.3.  Would probably discharge home on standing dose of oral potassium 40 mg daily, but with DSS and outpatient follow-up to determine if this is necessary once we have added spironolactone.  New onset atrial fibrillation -> successfully, cardioverted on amiodarone overnight. Plan for at least 1 month of amiodarone completing the standard loading dosing.  We will start with 400 mg twice daily this morning and stop IV We will continue for 400 mg twice daily to complete 5 days, then reduce to 200 mg twice daily for 5 days and then reduce to 200 mg daily maintenance => plan will be for 1 month coverage. Roughly 2 to 3 weeks following discontinuation of amiodarone, would order 14-day Zio patch monitor to determine A-fib burden Started on Eliquis 5 mg twice daily with CHA2DS2-VASc score  4.  DISPO: Successfully cardioverted overnight.  Was switched from Brilinta to Plavix and started on Eliquis successfully.  Aspirin will be discontinued. Was otherwise stable hemodynamically stable here today with no active symptoms back in sinus rhythm.  Stable for discharge home.   This patients CHA2DS2-VASc Score and unadjusted Ischemic Stroke Rate (% per year) is equal to 4.8 % stroke rate/year from a score of 4  Above score calculated as 1 point each if present [CHF, HTN, DM, Vascular=MI/PAD/Aortic  Plaque, Age if 65-74, or Male]  =================================================================== Subjective   Feels well - no further CP.  No sensation of palpitations or arrhythmia  Has not yet walked  Inpatient Medications    Scheduled Meds:  amiodarone  400 mg Oral BID   apixaban  5 mg Oral BID   atorvastatin  40 mg Oral Daily   carvedilol  3.125 mg Oral BID WC   Chlorhexidine Gluconate Cloth  6 each Topical Daily   clopidogrel  75 mg Oral Daily   empagliflozin  10 mg Oral Daily   influenza vaccine adjuvanted  0.5 mL Intramuscular Tomorrow-1000   potassium chloride  40 mEq Oral BID   sodium chloride flush  3 mL Intravenous Q12H   spironolactone  12.5 mg Oral Daily   EVOLVE-MI evolocumab  140 mg Subcutaneous Q14 Days   Continuous Infusions:  sodium chloride     PRN Meds: sodium chloride, acetaminophen, nitroGLYCERIN, ondansetron (ZOFRAN) IV, mouth rinse, oxyCODONE, sodium chloride flush   Vital Signs    Vitals:   11/02/21 0930 11/02/21 0945 11/02/21 1000 11/02/21 1015  BP: 103/77  114/83   Pulse: (!) 57 62 (!) 59 (!) 57  Resp: 17 20 15 14   Temp:      TempSrc:      SpO2: 94% 95% 95% 97%  Weight:      Height:        Intake/Output Summary (Last 24 hours) at 11/02/2021 1034 Last data filed at 11/02/2021 1000 Gross per 24 hour  Intake 850.42 ml  Output 600 ml  Net 250.42 ml      11/02/2021    5:00 AM 10/30/2021   10:26 AM 05/23/2019    7:48 AM  Last 3 Weights  Weight (lbs) 230 lb 6.1 oz 225 lb 225 lb  Weight (kg) 104.5 kg 102.059 kg 102.059 kg     Lab Results  Component Value Date   CHOL 122 10/31/2021   HDL 44 10/31/2021   LDLCALC 57 10/31/2021   TRIG 106 10/31/2021   CHOLHDL 2.8 10/31/2021    Telemetry    SR 80s, PVCs, AIVR - Personally Reviewed  ECG    No study today Personally Reviewed  Cardiac Studies   Cath-PCI 10/30/2021: 99% thrombotic SubTO of mLAD just after D2 -> DES PCI 3.0 x 16 Synergy XD => deployed to 3.0 mm.  2 big Diags,  LCx-OM1 & Large Dominant RCA - rPDA & PAV-RPL1-3 free of disease. EF ~40-45%, LVEDP 21--25 mmHg mid-apical anterior & apical HK.  Diagnostic: Dominance: Right       Intervention  LV Gram wall motion (1 normal, 2 Hypokinetic)    Echo 11/01/2021: LVEF 40 to 45% with mildly reduced function.  Severe hypokinesis of the mid-apical anterior anteroseptal and apical wall.  Moderate LVH.  Unable to assess diastolic function as the patient was in A-fib at the time.  Normal mitral and aortic valve.  Small pericardial effusion normal RV size and function.  Physical Exam   GEN: Well-nourished, nonambulatory.  No acute distress. Neck: No JVD or bruit Cardiac: RRR, normal S1 and S2.  No M/R/G.  Back in sinus rhythm. Respiratory: CTA B, nonlabored, good air movement. GI: Soft, nontender nondistended.  Normoactive bowel sounds. MS: No clubbing cyanosis or edema.  Radial cath site stable.  Left antecubital IV had been bleeding. Neuro:  Nonfocal  Psych: Normal affect   Labs    High Sensitivity Troponin:   Recent Labs  Lab 10/30/21 1035 10/30/21 1232 10/30/21 1430 10/30/21 1836  TROPONINIHS 3,857* 7,362* 10,586* >24,000*     Chemistry Recent Labs  Lab 10/30/21 1035 10/30/21 1836 10/31/21 0232 11/01/21 0729 11/02/21 0509  NA 140  --  137 141 139  K 3.5  --  3.5 3.9 3.3*  CL 102  --  106 102 104  CO2 24  --  22 25 24   GLUCOSE 172*  --  132* 128* 108*  BUN 18  --  15 11 12   CREATININE 1.17   < > 1.10 1.24 1.15  CALCIUM 9.7  --  8.4* 8.9 8.9  MG  --   --   --   --  2.0  PROT 7.7  --   --   --   --   ALBUMIN 4.7  --   --   --   --   AST 73*  --   --   --   --   ALT 41  --   --   --   --   ALKPHOS 53  --   --   --   --   BILITOT 1.1  --   --   --   --   GFRNONAA >60   < > >60 >60 >60  ANIONGAP 14  --  9 14 11    < > = values in this interval not displayed.    Lipids  Recent Labs  Lab 10/31/21 0232  CHOL 122  TRIG 106  HDL 44  LDLCALC 57  CHOLHDL 2.8    Hematology Recent Labs   Lab 10/30/21 1836 10/31/21 0232 11/02/21 0509  WBC 12.2* 10.6* 7.5  RBC 4.85 4.64 4.32  HGB 15.4 14.8 13.8  HCT 43.8 41.4 39.1  MCV 90.3 89.2 90.5  MCH 31.8 31.9 31.9  MCHC 35.2 35.7 35.3  RDW 13.5 13.7 13.4  PLT 173 152 137*   Thyroid No results for input(s): "TSH", "FREET4" in the last 168 hours.  BNPNo results for input(s): "BNP", "PROBNP" in the last 168 hours.  DDimer No results for input(s): "DDIMER" in the last 168 hours.   Radiology   CT Angio Chest/Abd/Pel for Dissection W and/or Wo Contrast  Result Date: 10/30/2021 CLINICAL DATA:  Patient reports some diarrhea last night, then developing projectile vomiting. Epigastric and chest pain, nonradiating. Distended abdomen. EXAM: CT ANGIOGRAPHY CHEST, ABDOMEN AND PELVIS TECHNIQUE: Multidetector CT imaging through the chest, abdomen and pelvis was performed using the standard protocol during bolus administration of intravenous contrast. Multiplanar reconstructed images and MIPs were obtained and reviewed to evaluate the vascular anatomy. RADIATION DOSE REDUCTION: This exam was performed according to the departmental dose-optimization program which includes automated exposure control, adjustment of the mA and/or kV according to patient size and/or use of iterative reconstruction technique. CONTRAST:  159mL OMNIPAQUE IOHEXOL 350 MG/ML SOLN COMPARISON:  09/24/2020 and 05/23/2019, CT abdomen and pelvis. FINDINGS: CTA CHEST FINDINGS Cardiovascular: Thoracic aorta is normal in caliber. No dissection. Mild atherosclerosis. Arch branch vessels are widely patent. Heart is normal in size  and configuration. Mild left and right coronary artery calcifications. No pericardial effusion. Mediastinum/Nodes: No enlarged mediastinal, hilar, or axillary lymph nodes. Thyroid gland, trachea, and esophagus demonstrate no significant findings. Lungs/Pleura: Lungs are clear. No pleural effusion or pneumothorax. Musculoskeletal: No fracture or acute finding. No  bone lesion. No chest wall mass. Review of the MIP images confirms the above findings. CTA ABDOMEN AND PELVIS FINDINGS VASCULAR Aorta: Atherosclerosis with no significant stenosis. No dissection. No evidence of a penetrating ulcer or mural hematoma. Celiac: Patent without evidence of aneurysm, dissection, vasculitis or significant stenosis. SMA: Mild atherosclerotic plaque at the origin, without significant stenosis. No aneurysm or dissection. Renals: Both renal arteries are patent without evidence of aneurysm, dissection, vasculitis, fibromuscular dysplasia or significant stenosis. IMA: Patent without evidence of aneurysm, dissection, vasculitis or significant stenosis. Inflow: Patent without evidence of aneurysm, dissection, vasculitis or significant stenosis. Veins: No obvious venous abnormality within the limitations of this arterial phase study. Review of the MIP images confirms the above findings. NON-VASCULAR Hepatobiliary: No focal liver abnormality is seen. No gallstones, gallbladder wall thickening, or biliary dilatation. Pancreas: Unremarkable. No pancreatic ductal dilatation or surrounding inflammatory changes. Spleen: Normal in size without focal abnormality. Adrenals/Urinary Tract: No adrenal mass. Mild renal cortical thinning. 1 cm low-density mass, posterior upper pole the left kidney, consistent with a cyst and unchanged, no follow-up recommended. No other renal masses, no stones and no hydronephrosis. Normal ureters. Normal bladder. Stomach/Bowel: Moderately distended stomach, otherwise unremarkable. Small bowel and colon are normal in caliber. No wall thickening. No inflammation. Numerous colonic diverticula without diverticulitis. Normal appendix visualized. Lymphatic: No enlarged lymph nodes. Reproductive: Mildly enlarged prostate, 5.2 x 4.2 cm transversely. Other: Small fat containing umbilical hernia.  No ascites. Musculoskeletal: No fracture.  No bone lesion. Review of the MIP images confirms  the above findings. IMPRESSION: CTA 1. No aortic aneurysm or dissection. No findings of acute aortic syndrome. 2. Aortic branch vessels are patent, with no hemodynamically significant stenosis. NON CTA 1. No acute findings within the chest, abdomen or pelvis. 2. Mild left and right coronary artery calcifications. Electronically Signed   By: Lajean Manes M.D.   On: 10/30/2021 12:11       For questions or updates, please contact [place] Please consult [email or web address] for contact info under        Signed, Glenetta Hew, MD  11/02/2021, 10:34 AM

## 2021-11-02 NOTE — Hospital Course (Signed)
   Admitted 10/30/2021 with non-STEMI with persistent pain and EKG changes concerning for ongoing ischemia => underwent cardiac catheterization with 99% subTO of LAD => DES PCI; pathophysiology consistent with aborted STEMI.  EF 40-45% (LAD WMA)  => successful DES PCI of the LAD performed restoring TIMI-3 flow after IC NTG for poor reflow and diffuse distal spasm. 11/01/2021: ~1330 with 1400 (shortly after echocardiogram performed, developed A-fib borderline RVR ==> planned discharge aborted to allow for treatment. Initiated amiodarone bolus and drip Dosed with Eliquis-initiated twice daily dosing Plan to convert from Brilinta to Plavix loading a.m. 11/02/2021 Plan DC aspirin by discharge.

## 2021-11-09 ENCOUNTER — Ambulatory Visit: Payer: BC Managed Care – PPO | Attending: Physician Assistant

## 2021-11-09 ENCOUNTER — Encounter: Payer: Self-pay | Admitting: Physician Assistant

## 2021-11-09 ENCOUNTER — Ambulatory Visit: Payer: BC Managed Care – PPO | Attending: Physician Assistant | Admitting: Physician Assistant

## 2021-11-09 VITALS — BP 120/72 | HR 53 | Ht 77.0 in | Wt 221.6 lb

## 2021-11-09 DIAGNOSIS — Z955 Presence of coronary angioplasty implant and graft: Secondary | ICD-10-CM

## 2021-11-09 DIAGNOSIS — I25110 Atherosclerotic heart disease of native coronary artery with unstable angina pectoris: Secondary | ICD-10-CM

## 2021-11-09 DIAGNOSIS — E109 Type 1 diabetes mellitus without complications: Secondary | ICD-10-CM

## 2021-11-09 DIAGNOSIS — I10 Essential (primary) hypertension: Secondary | ICD-10-CM

## 2021-11-09 DIAGNOSIS — I214 Non-ST elevation (NSTEMI) myocardial infarction: Secondary | ICD-10-CM | POA: Diagnosis not present

## 2021-11-09 DIAGNOSIS — E785 Hyperlipidemia, unspecified: Secondary | ICD-10-CM

## 2021-11-09 NOTE — Progress Notes (Unsigned)
Enrolled for [employer] to mail a ZIO XT long term holter monitor 12/13/21 to the patients address on file.  Patient to apply 2-3 weeks after completing 1 month dosing of Amiodarone, (starting 11/02/21).  Dr. Tamala Julian to read.

## 2021-11-09 NOTE — Progress Notes (Addendum)
Office Visit    Patient Name: Alex Arias Date of Encounter: 11/09/2021  PCP:  Barbie Banner, MD   [facility]  Cardiologist:  Lesleigh Noe, MD  Advanced Practice Provider:  No care team member to display Electrophysiologist:  None   HPI    Alex Arias is a 66 y.o. male with a past medical history significant for hypertension, BPH, hyperlipidemia presents today for hospital follow-up appointment.  Recently seen by [facility] cardiology 05/24/2021 and was evaluated for atypical chest pain.  Troponin is also negative at his prior ED visit.  They were considering ischemia versus GERD.  Changed from Prilosec to Protonix.  They checked an ESE (exercise stress echo) to assess for ischemia and then plan for LHC if positive.  Ultimately cardiac catheterization was performed which showed 99% stenosis of mid to distal LAD.  Successful drug-eluting stenting was performed.  Today, he has been doing well post PCI.  We went through what to expect after his stent placement.  He also had some issues with atrial fibrillation and is currently taking Eliquis.  We have ordered a ZIO monitor today which will be placed in about a month (after he finishes amiodarone).  He is tolerating all medications without any issues.  No further chest pain or shortness of breath.  Slowly increasing his exercise tolerance and plans to do cardiac rehab.  Blood pressure well controlled today.  Heart rate 53 but asymptomatic.  Reports no shortness of breath nor dyspnea on exertion. Reports no chest pain, pressure, or tightness. No edema, orthopnea, PND. Reports no palpitations.    Past Medical History    Past Medical History:  Diagnosis Date   Abdominal distension    Abdominal pain    Coronary artery disease involving native coronary artery of native heart with unstable angina pectoris (HCC) 10/30/2021   Cath-PCI 10/30/2021: 99% thrombotic SubTO of mLAD just after D2 -> DES PCI 3.0 x 16  Synergy XD => deployed to 3.0 mm.  2 big Diags, LCx-OM1 & Large Dominant RCA - rPDA & PAV-RPL1-3 free of disease. EF ~40-45%, LVEDP 21--25 mmHg mid-apical anterior & apical HK.   Cough    Family history of diabetes mellitus    Itching    NSTEMI (non-ST elevated myocardial infarction) (HCC) - + Troponin with Anterior & Inferior ST depressions => ongoing CP => Urgent Cath 10/30/2021   Cath with 99% subTO of LAD => DES PCI   Pleurisy    Presence of drug coated stent in LAD coronary artery 10/30/2021   mLAD 99% thrombotic lesion: DES PCI w/ Synergy XD 3 x 16 => deployed ~3-3.1 mm   Right inguinal hernia    Unspecified viral infection, in conditions classified elsewhere and of unspecified site    viral syndrome - per notes from [facility] dated 01/03/11   Past Surgical History:  Procedure Laterality Date   BOWEL RESECTION N/A 04/02/2014   Procedure: SMALL BOWEL RESECTION;  Surgeon: Violeta Gelinas, MD;  Location: [facility] OR;  Service: General;  Laterality: N/A;   CORONARY/GRAFT ACUTE MI REVASCULARIZATION N/A 10/30/2021   Procedure: Coronary/Graft Acute MI Revascularization;  Surgeon: Lyn Records, MD;  Location: [facility] INVASIVE CV LAB;  Service: Cardiovascular;  Laterality: N/A;   INGUINAL HERNIA REPAIR  02/01/2011   Procedure: LAPAROSCOPIC INGUINAL HERNIA;  Surgeon: Almond Lint, MD;  Location: [facility] OR;  Service: General;  Laterality: Bilateral;  Laparoscopic inguinal repair with mesh - Bilateral   INGUINAL HERNIA REPAIR Right 04/02/2014  Procedure: HERNIA REPAIR INGUINAL INCARCERATED ;  Surgeon: Violeta Gelinas, MD;  Location: [facility] OR;  Service: General;  Laterality: Right;   LEFT HEART CATH AND CORONARY ANGIOGRAPHY N/A 10/30/2021   Procedure: LEFT HEART CATH AND CORONARY ANGIOGRAPHY;  Surgeon: Lyn Records, MD;  Location: [facility] INVASIVE CV LAB;  Service: Cardiovascular;  Laterality: N/A;   WISDOM TOOTH EXTRACTION      Allergies  No Known Allergies  EKGs/Labs/Other Studies Reviewed:   The following  studies were reviewed today:  Cardiac catheterization and PCI 10/30/2021  Mid LAD to Dist LAD lesion  Stent  A drug-eluting stent was successfully placed.  Post-Intervention Lesion Assessment  The intervention was successful. Pre-interventional TIMI flow is 1. Post-intervention TIMI flow is 3. No complications occurred at this lesion.  There is a 0% residual stenosis post intervention.     Wall Motion              Left Heart  Left Ventricle LV end diastolic pressure is moderately elevated. The left ventricular ejection fraction is 45-50% by visual estimate. There are LV function abnormalities due to segmental dysfunction.   Coronary Diagrams  Diagnostic Dominance: Right  Intervention    EKG:  EKG is not ordered today.   Recent Labs: 10/30/2021: ALT 41 11/02/2021: BUN 12; Creatinine, Ser 1.15; Hemoglobin 13.8; Magnesium 2.0; Platelets 137; Potassium 3.3; Sodium 139  Recent Lipid Panel    Component Value Date/Time   CHOL 122 10/31/2021 0232   TRIG 106 10/31/2021 0232   HDL 44 10/31/2021 0232   CHOLHDL 2.8 10/31/2021 0232   VLDL 21 10/31/2021 0232   LDLCALC 57 10/31/2021 0232    Home Medications   Current Meds  Medication Sig   amiodarone (PACERONE) 200 MG tablet Take 2 tablets (400 mg total) by mouth 2 (two) times daily for 5 days, THEN 1 tablet (200 mg total) 2 (two) times daily for 5 days, THEN 1 tablet (200 mg total) daily.   apixaban (ELIQUIS) 5 MG TABS tablet Take 1 tablet (5 mg total) by mouth 2 (two) times daily.   atorvastatin (LIPITOR) 40 MG tablet Take 1 tablet (40 mg total) by mouth daily.   bismuth subsalicylate (PEPTO BISMOL) 262 MG/15ML suspension Take 30 mLs by mouth every 6 (six) hours as needed. For stomach upset   carvedilol (COREG) 3.125 MG tablet Take 1 tablet (3.125 mg total) by mouth 2 (two) times daily with a meal.   clopidogrel (PLAVIX) 75 MG tablet Take 1 tablet (75 mg total) by mouth daily.   empagliflozin (JARDIANCE) 10 MG TABS  tablet Take 1 tablet (10 mg total) by mouth daily.   levothyroxine (SYNTHROID) 88 MCG tablet Take 88 mcg by mouth daily.   Multiple Vitamin (MULITIVITAMIN WITH MINERALS) TABS Take 1 tablet by mouth daily.     nitroGLYCERIN (NITROSTAT) 0.4 MG SL tablet Place 1 tablet (0.4 mg total) under the tongue every 5 (five) minutes as needed for chest pain.   pantoprazole (PROTONIX) 40 MG tablet Take 40 mg by mouth daily.   potassium chloride SA (KLOR-CON M) 20 MEQ tablet Take 1 tablet (20 mEq total) by mouth daily.   spironolactone (ALDACTONE) 25 MG tablet Take 1/2 tablet (12.5 mg total) by mouth daily.   Study - EVOLVE-MI - evolocumab (REPATHA) 140 mg/mL SQ injection (PI-[place]uc[place]) Inject 140 mg into the skin every 14 (fourteen) days. For Investigational Use Only. Inject subcutaneously into abdomen, thigh, or upper arm every 14 days. Rotate injection sites and do not inject into areas  where skin is tender, bruised, or red. Please contact [place] for any questions or concerns regarding this medication.     Review of Systems      All other systems reviewed and are otherwise negative except as noted above.  Physical Exam    VS:  BP 120/72   Pulse (!) 53   Ht 6\' 5"  (1.956 m)   Wt 221 lb 9.6 oz (100.5 kg)   SpO2 98%   BMI 26.28 kg/m  , BMI Body mass index is 26.28 kg/m.  Wt Readings from Last 3 Encounters:  11/09/21 221 lb 9.6 oz (100.5 kg)  11/02/21 230 lb 6.1 oz (104.5 kg)  05/23/19 225 lb (102.1 kg)     GEN: Well nourished, well developed, in no acute distress. HEENT: normal. Neck: Supple, no JVD, carotid bruits, or masses. Cardiac: RRR, no murmurs, rubs, or gallops. No clubbing, cyanosis, edema.  Radials/PT 2+ and equal bilaterally.  Respiratory:  Respirations regular and unlabored, clear to auscultation bilaterally. GI: Soft, nontender, nondistended. MS: No deformity or atrophy. Skin: Warm and dry, no rash. Neuro:  Strength and sensation are intact. Psych: Normal  affect.  Assessment & Plan    NSTEMI/CAD s/p LAD PCI -Continue GDMT: Eliquis 5 mg twice daily, Lipitor 40 mg daily, Coreg 3.125 mg twice daily, Plavix 75 mg daily, Jardiance 10 mg daily, spironolactone 12.5 mg daily, nitro as needed -Euvolemic on exam today -Plan to repeat echocardiogram in 2 months  Hypertension -Well-controlled today in the office -Continue current medications  Hyperlipidemia -Recent lipid panel showed LDL 57, HDL 44, total cholesterol 122, and triglycerides 106 -All values at goal -Plan to repeat lipid panel in 1 year -Continue current regimen         Cardiac Rehabilitation Eligibility Assessment  The patient is ready to start cardiac rehabilitation from a cardiac standpoint.   Disposition: Follow up 3 months with Sinclair Grooms, MD or APP.  Signed, Elgie Collard, PA-C 11/09/2021, 6:00 PM [place]

## 2021-11-09 NOTE — Patient Instructions (Signed)
Medication Instructions:  Your physician recommends that you continue on your current medications as directed. Please refer to the Current Medication list given to you today.  *If you need a refill on your cardiac medications before your next appointment, please call your pharmacy*   Lab Work: None If you have labs (blood work) drawn today and your tests are completely normal, you will receive your results only by: [place] (if you have MyChart) OR A paper copy in the mail If you have any lab test that is abnormal or we need to change your treatment, we will call you to review the results.   Testing/Procedures: Your physician has requested that you have an echocardiogram in December after you have finished wearing the [facility] monitor. Echocardiography is a painless test that uses sound waves to create images of your heart. It provides your doctor with information about the size and shape of your heart and how well your heart's chambers and valves are working. This procedure takes approximately one hour. There are no restrictions for this procedure. Please do NOT wear cologne, perfume, aftershave, or lotions (deodorant is allowed). Please arrive 15 minutes prior to your appointment time.   ZIO XT- Long Term Monitor Instructions  Your physician has requested you wear a ZIO patch monitor for 14 days in late November. This is a single patch monitor. [employer] supplies one patch monitor per enrollment. Additional stickers are not available. Please do not apply patch if you will be having a Nuclear Stress Test,  Echocardiogram, Cardiac CT, MRI, or Chest Xray during the period you would be wearing the  monitor. The patch cannot be worn during these tests. You cannot remove and re-apply the  ZIO XT patch monitor.  Your ZIO patch monitor will be mailed 3 day USPS to your address on file. It may take 3-5 days  to receive your monitor after you have been enrolled.  Once you have received your  monitor, please review the enclosed instructions. Your monitor  has already been registered assigning a specific monitor serial # to you.  Billing and Patient Assistance Program Information  We have supplied [employer] with any of your insurance information on file for billing purposes. [employer] offers a sliding scale Patient Assistance Program for patients that do not have  insurance, or whose insurance does not completely cover the cost of the ZIO monitor.  You must apply for the Patient Assistance Program to qualify for this discounted rate.  To apply, please call [employer] at [phone], select option 4, select option 2, ask to apply for  Patient Assistance Program. Theodore Demark will ask your household income, and how many people  are in your household. They will quote your out-of-pocket cost based on that information.  [employer] will also be able to set up a 76-month, interest-free payment plan if needed.  Applying the monitor   Shave hair from upper left chest.  Hold abrader disc by orange tab. Rub abrader in 40 strokes over the upper left chest as  indicated in your monitor instructions.  Clean area with 4 enclosed alcohol pads. Let dry.  Apply patch as indicated in monitor instructions. Patch will be placed under collarbone on left  side of chest with arrow pointing upward.  Rub patch adhesive wings for 2 minutes. Remove white label marked "1". Remove the white  label marked "2". Rub patch adhesive wings for 2 additional minutes.  While looking in a mirror, press and release button in center of patch. A small green  light will  flash 3-4 times. This will be your only indicator that the monitor has been turned on.  Do not shower for the first 24 hours. You may shower after the first 24 hours.  Press the button if you feel a symptom. You will hear a small click. Record Date, Time and  Symptom in the Patient Logbook.  When you are ready to remove the patch, follow instructions on the last 2  pages of Patient  Logbook. Stick patch monitor onto the last page of Patient Logbook.  Place Patient Logbook in the blue and white box. Use locking tab on box and tape box closed  securely. The blue and white box has prepaid postage on it. Please place it in the mailbox as  soon as possible. Your physician should have your test results approximately 7 days after the  monitor has been mailed back to [facility].  Call [facility] at [phone] if you have questions regarding  your ZIO XT patch monitor. Call them immediately if you see an orange light blinking on your  monitor.  If your monitor falls off in less than 4 days, contact our Monitor department at [phone].  If your monitor becomes loose or falls off after 4 days call [employer] at [phone] for  suggestions on securing your monitor    Follow-Up: At [facility], you and your health needs are our priority.  As part of our continuing mission to provide you with exceptional heart care, we have created designated Provider Care Teams.  These Care Teams include your primary Cardiologist (physician) and Advanced Practice Providers (APPs -  Physician Assistants and Nurse Practitioners) who all work together to provide you with the care you need, when you need it.   Your next appointment:   3 month(s)  The format for your next appointment:   In Person  Provider:   Dr Gasper Sells  Other Instructions 1.Weigh every morning after using the restroom but before breakfast, keep a log and call us if you have a weight gain of 2-3 pounds or more overnight or 5 pounds in a week 2.Ask the pharmacist about choline   Important Information About Sugar

## 2021-11-10 DIAGNOSIS — Z09 Encounter for follow-up examination after completed treatment for conditions other than malignant neoplasm: Secondary | ICD-10-CM | POA: Diagnosis not present

## 2021-11-10 DIAGNOSIS — D696 Thrombocytopenia, unspecified: Secondary | ICD-10-CM | POA: Diagnosis not present

## 2021-11-10 DIAGNOSIS — I4891 Unspecified atrial fibrillation: Secondary | ICD-10-CM | POA: Diagnosis not present

## 2021-11-10 DIAGNOSIS — I2102 ST elevation (STEMI) myocardial infarction involving left anterior descending coronary artery: Secondary | ICD-10-CM | POA: Diagnosis not present

## 2021-11-10 DIAGNOSIS — E876 Hypokalemia: Secondary | ICD-10-CM | POA: Diagnosis not present

## 2021-11-10 DIAGNOSIS — I2510 Atherosclerotic heart disease of native coronary artery without angina pectoris: Secondary | ICD-10-CM | POA: Diagnosis not present

## 2021-11-15 DIAGNOSIS — G4733 Obstructive sleep apnea (adult) (pediatric): Secondary | ICD-10-CM | POA: Diagnosis not present

## 2021-11-16 DIAGNOSIS — I10 Essential (primary) hypertension: Secondary | ICD-10-CM | POA: Diagnosis not present

## 2021-11-16 DIAGNOSIS — G4733 Obstructive sleep apnea (adult) (pediatric): Secondary | ICD-10-CM | POA: Diagnosis not present

## 2021-11-21 ENCOUNTER — Telehealth: Payer: Self-pay | Admitting: Internal Medicine

## 2021-11-21 NOTE — Telephone Encounter (Signed)
11/09/2301:23:11 PM Lyda Jester S Open Note Print Note Print Text  Note   Type:General     General   Pt wife returned phone call and stated that pt is interested in the intensive cardiac rehab program. I advised pt's wife that I would pass his information over to the nurse navigator for review and that we had a waitlist of about a  month and a half and we would contact them around that time frame or sooner.

## 2021-11-21 NOTE — Telephone Encounter (Signed)
/  o swelling: STAT is pt has developed SOB within 24 hours   If swelling, where is the swelling located? A little bit of swelling in stomach, and a little bit in the calf.    How much weight have you gained and in what time span? 3 lbs in a day    Have you gained 3 pounds in a day or 5 pounds in a week? Gained 3 lbs in a day   Do you have a log of your daily weights (if so, list)? 10/31 216, 11/1 216, 11/2 217, 11/3, 216.4, 11/4 216, 11/5 219, 11/6 220.6   Are you currently taking a fluid pill? Yes    Are you currently SOB? No    Have you traveled recently? No   Patient states he has been having some kidney pain, right flank side.

## 2021-11-21 NOTE — Telephone Encounter (Signed)
Per pt is not SOB and is walking about a mile day does note some back pain in the kidney area and maybe some swelling in calves and ankles  Pt feels like is urinating okay and watches salt intake but notes weight creeping up  see weights below  Per pt and wife K supplement  was stopped due to elevated K and is having rechecked Friday

## 2021-11-21 NOTE — Telephone Encounter (Signed)
Pt c/o swelling: STAT is pt has developed SOB within 24 hours  If swelling, where is the swelling located? A little bit of swelling in stomach, and a little bit in the calf.   How much weight have you gained and in what time span? 3 lbs in a day   Have you gained 3 pounds in a day or 5 pounds in a week? Gained 3 lbs in a day  Do you have a log of your daily weights (if so, list)? 10/31 216, 11/1 216, 11/2 217, 11/3, 216.4, 11/4 216, 11/5 219, 11/6 220.6  Are you currently taking a fluid pill? Yes   Are you currently SOB? No   Have you traveled recently? No   Patient states he has been having some kidney pain, right flank side.

## 2021-11-22 DIAGNOSIS — R109 Unspecified abdominal pain: Secondary | ICD-10-CM | POA: Diagnosis not present

## 2021-11-22 DIAGNOSIS — E875 Hyperkalemia: Secondary | ICD-10-CM | POA: Diagnosis not present

## 2021-11-22 DIAGNOSIS — Z8679 Personal history of other diseases of the circulatory system: Secondary | ICD-10-CM | POA: Diagnosis not present

## 2021-11-23 ENCOUNTER — Telehealth (HOSPITAL_COMMUNITY): Payer: Self-pay

## 2021-11-23 NOTE — Telephone Encounter (Signed)
Pt wife called and is very anxious for pt to start Intensive cardiac rehab. I advised pt wife that pt was just cleared on Nov 1 by our nurse navigator and that we have about a month and half waitlist. I explained to pt that the waitlist time starts after pt has been cleared. Pt was very emotional, I advised pt that we are doing all we can to try and get him in, and went back over the waitlist.

## 2021-11-29 DIAGNOSIS — G4733 Obstructive sleep apnea (adult) (pediatric): Secondary | ICD-10-CM | POA: Diagnosis not present

## 2021-12-01 ENCOUNTER — Other Ambulatory Visit (HOSPITAL_COMMUNITY): Payer: Self-pay

## 2021-12-01 NOTE — Telephone Encounter (Signed)
Spoke with pt and feels fine weight is back to 216 , 217 range no complaints and pt is still on waiting list for cardiac rehab and is checking in weekly to see where he is in the process ./cy

## 2021-12-05 ENCOUNTER — Other Ambulatory Visit (HOSPITAL_COMMUNITY): Payer: Self-pay

## 2021-12-12 ENCOUNTER — Telehealth (HOSPITAL_COMMUNITY): Payer: Self-pay | Admitting: Family Medicine

## 2021-12-12 ENCOUNTER — Telehealth (HOSPITAL_COMMUNITY): Payer: Self-pay

## 2021-12-12 NOTE — Telephone Encounter (Signed)
Called and spoke with pt in regards to scheduling for CR for an opening on 11/28. [place].

## 2021-12-23 ENCOUNTER — Telehealth (HOSPITAL_COMMUNITY): Payer: Self-pay

## 2021-12-23 NOTE — Telephone Encounter (Signed)
Called patient to see if he was interested in participating in the Cardiac Rehab Program. Patient stated yes. Patient will come in for orientation on 01/05/22 @ 10:30AM and will attend the 10:15AM exercise class.   Pensions consultant.

## 2021-12-27 ENCOUNTER — Encounter: Payer: Self-pay | Admitting: Interventional Cardiology

## 2021-12-28 ENCOUNTER — Ambulatory Visit (HOSPITAL_COMMUNITY): Payer: BC Managed Care – PPO | Attending: Physician Assistant

## 2021-12-28 DIAGNOSIS — I25110 Atherosclerotic heart disease of native coronary artery with unstable angina pectoris: Secondary | ICD-10-CM

## 2021-12-28 DIAGNOSIS — I2510 Atherosclerotic heart disease of native coronary artery without angina pectoris: Secondary | ICD-10-CM | POA: Insufficient documentation

## 2021-12-28 DIAGNOSIS — I252 Old myocardial infarction: Secondary | ICD-10-CM | POA: Insufficient documentation

## 2021-12-28 DIAGNOSIS — I082 Rheumatic disorders of both aortic and tricuspid valves: Secondary | ICD-10-CM | POA: Insufficient documentation

## 2021-12-28 DIAGNOSIS — Z955 Presence of coronary angioplasty implant and graft: Secondary | ICD-10-CM

## 2021-12-28 LAB — ECHOCARDIOGRAM COMPLETE: Area-P 1/2: 3.68 cm2

## 2021-12-28 MED ORDER — PERFLUTREN LIPID MICROSPHERE
1.0000 mL | INTRAVENOUS | Status: AC | PRN
  Administered 2021-12-28: 2 mL via INTRAVENOUS

## 2021-12-29 DIAGNOSIS — G4733 Obstructive sleep apnea (adult) (pediatric): Secondary | ICD-10-CM | POA: Diagnosis not present

## 2022-01-02 ENCOUNTER — Telehealth: Payer: Self-pay | Admitting: Interventional Cardiology

## 2022-01-02 DIAGNOSIS — I10 Essential (primary) hypertension: Secondary | ICD-10-CM

## 2022-01-02 DIAGNOSIS — Z79899 Other long term (current) drug therapy: Secondary | ICD-10-CM

## 2022-01-02 NOTE — Telephone Encounter (Signed)
Pt c/o medication issue:  1. Name of Medication: Entresto   2. How are you currently taking this medication (dosage and times per day)?   3. Are you having a reaction (difficulty breathing--STAT)?   4. What is your medication issue? Pt states they would like a call back in regards to starting this medication.

## 2022-01-02 NOTE — Telephone Encounter (Signed)
Returned call to patient.  Patient states he was told that based on his echocardiogram results we would consider starting Entresto at his next office visit. Patient states he would like to start this medication now if possible.  Will forward to Jari Favre, PA-C to advised on starting dosage.

## 2022-01-04 ENCOUNTER — Telehealth (HOSPITAL_COMMUNITY): Payer: Self-pay

## 2022-01-05 ENCOUNTER — Encounter (HOSPITAL_COMMUNITY): Payer: Self-pay

## 2022-01-05 ENCOUNTER — Encounter (HOSPITAL_COMMUNITY)
Admission: RE | Admit: 2022-01-05 | Discharge: 2022-01-05 | Disposition: A | Discharge status: Home / Self Care | Payer: BC Managed Care – PPO | Source: Ambulatory Visit | Attending: Internal Medicine | Admitting: Internal Medicine

## 2022-01-05 VITALS — BP 116/78 | HR 47 | Ht 75.5 in | Wt 224.2 lb

## 2022-01-05 DIAGNOSIS — Z955 Presence of coronary angioplasty implant and graft: Secondary | ICD-10-CM | POA: Insufficient documentation

## 2022-01-05 DIAGNOSIS — I10 Essential (primary) hypertension: Secondary | ICD-10-CM

## 2022-01-05 DIAGNOSIS — Z79899 Other long term (current) drug therapy: Secondary | ICD-10-CM

## 2022-01-05 DIAGNOSIS — I214 Non-ST elevation (NSTEMI) myocardial infarction: Secondary | ICD-10-CM | POA: Diagnosis not present

## 2022-01-05 HISTORY — DX: Cardiac arrhythmia, unspecified: I49.9

## 2022-01-05 NOTE — Progress Notes (Signed)
Resting heart rate at 46-48 at cardiac rehab orientation. Sinus brady. Patient asymptomatic. Medications reviewed. Completed 6 minute walk test maximum heart rate noted at 78. Alex Arias notified about Sinus brady. Alex Arias called and told Alex Arias over the phone to stop taking his Coreg. Patient and his wife are aware of the medication change as his wife was on the phone on speaker when Alex Arias talked with Alex Arias over the phone.Alex Headings RN BSN

## 2022-01-05 NOTE — Progress Notes (Signed)
Cardiac Rehab Medication Review by a Nurse  Does the patient  feel that his/her medications are working for him/her?  yes  Has the patient been experiencing any side effects to the medications prescribed?  no  Does the patient measure his/her own blood pressure or blood glucose at home?  yes   Does the patient have any problems obtaining medications due to transportation or finances?   no  Understanding of regimen: excellent Understanding of indications: good Potential of compliance: good    Nurse comments: Alex Arias is taking his medications as prescribed and has a good understanding of what his medications are for. Alex Arias checks his blood pressures and oxygen saturations intermittently.     [name] RN 01/05/2022 12:00 PM

## 2022-01-05 NOTE — Progress Notes (Signed)
hello Tessa I have Mr Alex Arias here with me for cardiac rehab. his heart rate was in the mid 40's as low as 46 he was asymptomatic. Blood pressure 116/75. he says he is not taking amiodarone anymore. he completed his 6 minutes walk test today. he sees Dr Alvino Blood in January as Dr Katrinka Blazing is retiring any concerns?  Instant message to Jari Favre [facility].Thayer Headings RN BSN

## 2022-01-05 NOTE — Progress Notes (Signed)
he can stop his carvedilol message per Jari Favre PAC. Patient was called by Julian Hy and told about medication change over the phone.Thayer Headings RN BSN

## 2022-01-05 NOTE — Telephone Encounter (Signed)
Left message for patient to callback to discuss starting Entresto, confirm pharmacy, discuss patient assistance and schedule follow-up lab work.  Provided office number for callback.    Also sent to patient via MyChart message.

## 2022-01-05 NOTE — Progress Notes (Signed)
Cardiac Individual Treatment Plan  Patient Details  Name: Alex Arias MRN: [id number] Date of Birth: [birth year] Referring Provider:   Flowsheet Row INTENSIVE CARDIAC REHAB ORIENT from 01/05/2022 in [facility] for [facility], V[facility], & [place]  Referring Provider Glenetta Hew, MD       Initial Encounter Date:  [facility] [place] from 01/05/2022 in [facility] for Heart, Vascular, & Lung Health  Date 01/05/22       Visit Diagnosis: 10/30/21 NSTEMI  10/30/21 S/P DES LAD  Patient's Home Medications on Admission:  Current Outpatient Medications:    apixaban (ELIQUIS) 5 MG TABS tablet, Take 1 tablet (5 mg total) by mouth 2 (two) times daily., Disp: 60 tablet, Rfl: 3   atorvastatin (LIPITOR) 40 MG tablet, Take 1 tablet (40 mg total) by mouth daily., Disp: 90 tablet, Rfl: 0   carvedilol (COREG) 3.125 MG tablet, Take 1 tablet (3.125 mg total) by mouth 2 (two) times daily with a meal., Disp: 180 tablet, Rfl: 0   Cholecalciferol (VITAMIN D3 PO), Take 1 capsule by mouth every evening., Disp: , Rfl:    clopidogrel (PLAVIX) 75 MG tablet, Take 1 tablet (75 mg total) by mouth daily., Disp: 90 tablet, Rfl: 2   empagliflozin (JARDIANCE) 10 MG TABS tablet, Take 1 tablet (10 mg total) by mouth daily., Disp: 30 tablet, Rfl: 2   levothyroxine (SYNTHROID) 88 MCG tablet, Take 88 mcg by mouth daily before breakfast., Disp: , Rfl:    Multiple Vitamin (MULITIVITAMIN WITH MINERALS) TABS, Take 1 tablet by mouth every evening., Disp: , Rfl:    nitroGLYCERIN (NITROSTAT) 0.4 MG SL tablet, Place 1 tablet (0.4 mg total) under the tongue every 5 (five) minutes as needed for chest pain., Disp: 25 tablet, Rfl: 2   pantoprazole (PROTONIX) 40 MG tablet, Take 40 mg by mouth in the morning., Disp: , Rfl:    spironolactone (ALDACTONE) 25 MG tablet, Take 1/2 tablet (12.5 mg total) by mouth daily., Disp: 45 tablet, Rfl: 1   Study - EVOLVE-MI -  evolocumab (REPATHA) 140 mg/mL SQ injection (PI-Stuckey), Inject 140 mg into the skin every 14 (fourteen) days. For Investigational Use Only. Inject subcutaneously into abdomen, thigh, or upper arm every 14 days. Rotate injection sites and do not inject into areas where skin is tender, bruised, or red. Please contact [facility] for any questions or concerns regarding this medication., Disp: , Rfl:    tadalafil (CIALIS) 5 MG tablet, Take 5 mg by mouth every evening., Disp: , Rfl:    amiodarone (PACERONE) 200 MG tablet, Take 2 tablets (400 mg total) by mouth 2 (two) times daily for 5 days, THEN 1 tablet (200 mg total) 2 (two) times daily for 5 days, THEN 1 tablet (200 mg total) daily. (Patient not taking: Reported on 01/04/2022), Disp: 60 tablet, Rfl: 0   potassium chloride SA (KLOR-CON M) 20 MEQ tablet, Take 1 tablet (20 mEq total) by mouth daily. (Patient not taking: Reported on 01/04/2022), Disp: 60 tablet, Rfl: 0  Past Medical History: Past Medical History:  Diagnosis Date   Abdominal distension    Abdominal pain    Arrhythmia    Coronary artery disease involving native coronary artery of native heart with unstable angina pectoris ([place]) 10/30/2021   Cath-PCI 10/30/2021: 99% thrombotic SubTO of mLAD just after D2 -> DES PCI 3.0 x 16 Synergy XD => deployed to 3.0 mm.  2 big Diags, LCx-OM1 & Large Dominant RCA - rPDA &  PAV-RPL1-3 free of disease. EF ~40-45%, LVEDP 21--25 mmHg mid-apical anterior & apical HK.   Cough    Family history of diabetes mellitus    Itching    NSTEMI (non-ST elevated myocardial infarction) ([place]) - + Troponin with Anterior & Inferior ST depressions => ongoing CP => Urgent Cath 10/30/2021   Cath with 99% subTO of LAD => DES PCI   Pleurisy    Presence of drug coated stent in LAD coronary artery 10/30/2021   mLAD 99% thrombotic lesion: DES PCI w/ Synergy XD 3 x 16 => deployed ~3-3.1 mm   Right inguinal hernia    Unspecified viral infection, in conditions  classified elsewhere and of unspecified site    viral syndrome - per notes from [facility] dated 01/03/11    Tobacco Use: Social History   Tobacco Use  Smoking Status Never  Smokeless Tobacco Never    Labs: Review Flowsheet       Latest Ref Rng & Units 04/02/2014 07/24/2014 10/31/2021  Labs for ITP Cardiac and Pulmonary Rehab  Cholestrol 0 - 200 mg/dL - - 122   LDL (calc) 0 - 99 mg/dL - - 57   HDL-C >40 mg/dL - - 44   Trlycerides <150 mg/dL - - 106   Hemoglobin A1c 4.8 - 5.6 % - - 5.1   TCO2 0 - 100 mmol/L 19  27  -    Capillary Blood Glucose: No results found for: "GLUCAP"   Exercise Target Goals: Exercise Program Goal: Individual exercise prescription set using results from initial 6 min walk test and THRR while considering  patient's activity barriers and safety.   Exercise Prescription Goal: Initial exercise prescription builds to 30-45 minutes a day of aerobic activity, 2-3 days per week.  Home exercise guidelines will be given to patient during program as part of exercise prescription that the participant will acknowledge.  Activity Barriers & Risk Stratification:  Activity Barriers & Cardiac Risk Stratification - 01/05/22 1443       Activity Barriers & Cardiac Risk Stratification   Activity Barriers Decreased Ventricular Function    Cardiac Risk Stratification High   <5 METs on 6MWT            6 Minute Walk:  6 Minute Walk     Row Name 01/05/22 1442         6 Minute Walk   Phase Initial     Distance 1628 feet     Walk Time 6 minutes     # of Rest Breaks 0     MPH 3.08     METS 3.41     RPE 11     Perceived Dyspnea  0     VO2 Peak 11.94     Symptoms No     Resting HR 47 bpm     Resting BP 116/78     Resting Oxygen Saturation  99 %     Exercise Oxygen Saturation  during 6 min walk 100 %     Max Ex. HR 76 bpm     Max Ex. BP 122/80     2 Minute Post BP 116/76              Oxygen Initial Assessment:   Oxygen  Re-Evaluation:   Oxygen Discharge (Final Oxygen Re-Evaluation):   Initial Exercise Prescription:  Initial Exercise Prescription - 01/05/22 1400       Date of Initial Exercise RX and Referring Provider   Date 01/05/22    Referring Provider  Glenetta Hew, MD    Expected Discharge Date 03/03/22      NuStep   Level 3    SPM 75    Minutes 15    METs 3      Elliptical   Level 1    Minutes 15    METs 3      Prescription Details   Frequency (times per week) 3    Duration Progress to 30 minutes of continuous aerobic without signs/symptoms of physical distress      Intensity   THRR 40-80% of Max Heartrate 60-121    Ratings of Perceived Exertion 11-13    Perceived Dyspnea 0-4      Progression   Progression Continue progressive overload as per policy without signs/symptoms or physical distress.      Resistance Training   Training Prescription Yes    Weight 4.    Reps 10-15             Perform Capillary Blood Glucose checks as needed.  Exercise Prescription Changes:   Exercise Comments:   Exercise Goals and Review:   Exercise Goals     Row Name 01/05/22 1452             Exercise Goals   Increase Physical Activity Yes       Intervention Provide advice, education, support and counseling about physical activity/exercise needs.;Develop an individualized exercise prescription for aerobic and resistive training based on initial evaluation findings, risk stratification, comorbidities and participant's personal goals.       Expected Outcomes Short Term: Attend rehab on a regular basis to increase amount of physical activity.;Long Term: Exercising regularly at least 3-5 days a week.;Long Term: Add in home exercise to make exercise part of routine and to increase amount of physical activity.       Increase Strength and Stamina Yes       Intervention Provide advice, education, support and counseling about physical activity/exercise needs.;Develop an individualized  exercise prescription for aerobic and resistive training based on initial evaluation findings, risk stratification, comorbidities and participant's personal goals.       Expected Outcomes Short Term: Increase workloads from initial exercise prescription for resistance, speed, and METs.;Short Term: Perform resistance training exercises routinely during rehab and add in resistance training at home;Long Term: Improve cardiorespiratory fitness, muscular endurance and strength as measured by increased METs and functional capacity (6MWT)       Able to understand and use rate of perceived exertion (RPE) scale Yes       Intervention Provide education and explanation on how to use RPE scale       Expected Outcomes Short Term: Able to use RPE daily in rehab to express subjective intensity level;Long Term:  Able to use RPE to guide intensity level when exercising independently       Knowledge and understanding of Target Heart Rate Range (THRR) Yes       Intervention Provide education and explanation of THRR including how the numbers were predicted and where they are located for reference       Expected Outcomes Short Term: Able to state/look up THRR;Short Term: Able to use daily as guideline for intensity in rehab;Long Term: Able to use THRR to govern intensity when exercising independently       Understanding of Exercise Prescription Yes       Intervention Provide education, explanation, and written materials on patient's individual exercise prescription       Expected Outcomes Short Term: Able to  explain program exercise prescription;Long Term: Able to explain home exercise prescription to exercise independently                Exercise Goals Re-Evaluation :   Discharge Exercise Prescription (Final Exercise Prescription Changes):   Nutrition:  Target Goals: Understanding of nutrition guidelines, daily intake of sodium 1500mg , cholesterol 200mg , calories 30% from fat and 7% or less from saturated  fats, daily to have 5 or more servings of fruits and vegetables.  Biometrics:  Pre Biometrics - 01/05/22 1439       Pre Biometrics   Waist Circumference 43 inches    Hip Circumference 43.5 inches    Waist to Hip Ratio 0.99 %    Triceps Skinfold 13 mm    % Body Fat 27.7 %    Grip Strength 42 kg    Flexibility 15.75 in    Single Leg Stand 20.93 seconds              Nutrition Therapy Plan and Nutrition Goals:   Nutrition Assessments:  Nutrition Assessments - 01/05/22 1320       Rate Your Plate Scores   Pre Score 54            MEDIFICTS Score Key: ?70 Need to make dietary changes  40-70 Heart Healthy Diet ? 40 Therapeutic Level Cholesterol Diet   Flowsheet Row INTENSIVE CARDIAC REHAB ORIENT from 01/05/2022 in [facility] for Heart, Vascular, & Lung Health  Picture Your Plate Total Score on Admission 54      Picture Your Plate Scores: [id number] Unhealthy dietary pattern with much room for improvement. 41-50 Dietary pattern unlikely to meet recommendations for good health and room for improvement. 51-60 More healthful dietary pattern, with some room for improvement.  >60 Healthy dietary pattern, although there may be some specific behaviors that could be improved.    Nutrition Goals Re-Evaluation:   Nutrition Goals Re-Evaluation:   Nutrition Goals Discharge (Final Nutrition Goals Re-Evaluation):   Psychosocial: Target Goals: Acknowledge presence or absence of significant depression and/or stress, maximize coping skills, provide positive support system. Participant is able to verbalize types and ability to use techniques and skills needed for reducing stress and depression.  Initial Review & Psychosocial Screening:  Initial Psych Review & Screening - 01/05/22 1538       Initial Review   Current issues with None Identified      Family Dynamics   Good Support System? Yes   Ed has his wife and two daughter for support     Barriers    Psychosocial barriers to participate in program There are no identifiable barriers or psychosocial needs.      Screening Interventions   Interventions Encouraged to exercise             Quality of Life Scores:  Quality of Life - 01/05/22 1453       Quality of Life   Select Quality of Life      Quality of Life Scores   Health/Function Pre 26.73 %    Socioeconomic Pre 25.42 %    Psych/Spiritual Pre 27.71 %    Family Pre 26.4 %    GLOBAL Pre 26.65 %            Scores of 19 and below usually indicate a poorer quality of life in these areas.  A difference of  2-3 points is a clinically meaningful difference.  A difference of 2-3 points in the total score of the  Quality of Life Index has been associated with significant improvement in overall quality of life, self-image, physical symptoms, and general health in studies assessing change in quality of life.  PHQ-9: Review Flowsheet       01/05/2022  Depression screen PHQ 2/9  Decreased Interest 0  Down, Depressed, Hopeless 0  PHQ - 2 Score 0   Interpretation of Total Score  Total Score Depression Severity:  1-4 = Minimal depression, 5-9 = Mild depression, 10-14 = Moderate depression, 15-19 = Moderately severe depression, 20-27 = Severe depression   Psychosocial Evaluation and Intervention:   Psychosocial Re-Evaluation:   Psychosocial Discharge (Final Psychosocial Re-Evaluation):   Vocational Rehabilitation: Provide vocational rehab assistance to qualifying candidates.   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 Participant will state understanding/return demonstration of  topics presented.     Core Videos: Exercise    Move It!  Clinical staff conducted group or individual video education with verbal and written material and guidebook.  Patient learns the recommended Pritikin exercise program. Exercise with the goal of living a long, healthy life. Some of the health benefits of exercise include controlled diabetes, healthier blood pressure levels, improved cholesterol levels, improved heart and lung capacity, improved sleep, and better body composition. Everyone should speak with their doctor before starting or changing an exercise routine.  Biomechanical Limitations Clinical staff conducted group or individual video education with verbal and written material and guidebook.  Patient learns how biomechanical limitations can impact exercise and how we can mitigate and possibly overcome limitations to have an impactful and balanced exercise routine.  Body Composition Clinical staff conducted group or individual video education with verbal and written material and guidebook.  Patient learns that body composition (ratio of muscle mass to fat mass) is a key component to assessing overall fitness, rather than body weight alone. Increased fat mass, especially visceral belly fat, can put [place] at increased risk for metabolic syndrome, type 2 diabetes, heart disease, and even death. It is recommended to combine diet and exercise (cardiovascular and resistance training) to improve your body composition. Seek guidance from your physician and exercise physiologist before implementing an exercise routine.  Exercise Action Plan Clinical staff conducted group or individual video education with verbal and written material and guidebook.  Patient learns the recommended strategies to achieve and enjoy long-term exercise adherence, including variety, self-motivation, self-efficacy, and positive decision making. Benefits of exercise include fitness, good health, weight management, more  energy, better sleep, less stress, and overall well-being.  Medical   Heart Disease Risk Reduction Clinical staff conducted group or individual video education with verbal and written material and guidebook.  Patient learns our heart is our most vital organ as it circulates oxygen, nutrients, white blood cells, and hormones throughout the entire body, and carries waste away. Data supports a plant-based eating plan like the Pritikin Program for its effectiveness in slowing progression of and reversing heart disease. The video provides a number of recommendations to address heart disease.   Metabolic Syndrome and Belly Fat  Clinical staff conducted group or individual video education with verbal and written material and guidebook.  Patient  learns what metabolic syndrome is, how it leads to heart disease, and how one can reverse it and keep it from coming back. You have metabolic syndrome if you have 3 of the following 5 criteria: abdominal obesity, high blood pressure, high triglycerides, low HDL cholesterol, and high blood sugar.  Hypertension and Heart Disease Clinical staff conducted group or individual video education with verbal and written material and guidebook.  Patient learns that high blood pressure, or hypertension, is very common in the [place]. Hypertension is largely due to excessive salt intake, but other important risk factors include being overweight, physical inactivity, drinking too much alcohol, smoking, and not eating enough potassium from fruits and vegetables. High blood pressure is a leading risk factor for heart attack, stroke, congestive heart failure, dementia, kidney failure, and premature death. Long-term effects of excessive salt intake include stiffening of the arteries and thickening of heart muscle and organ damage. Recommendations include ways to reduce hypertension and the risk of heart disease.  Diseases of Our Time - Focusing on Diabetes Clinical staff  conducted group or individual video education with verbal and written material and guidebook.  Patient learns why the best way to stop diseases of our time is prevention, through food and other lifestyle changes. Medicine (such as prescription pills and surgeries) is often only a Band-Aid on the problem, not a long-term solution. Most common diseases of our time include obesity, type 2 diabetes, hypertension, heart disease, and cancer. The Pritikin Program is recommended and has been proven to help reduce, reverse, and/or prevent the damaging effects of metabolic syndrome.  Nutrition   Overview of the Pritikin Eating Plan  Clinical staff conducted group or individual video education with verbal and written material and guidebook.  Patient learns about the [place] for disease risk reduction. The [place] emphasizes a wide variety of unrefined, minimally-processed carbohydrates, like fruits, vegetables, whole grains, and legumes. Go, Caution, and Stop food choices are explained. Plant-based and lean animal proteins are emphasized. Rationale provided for low sodium intake for blood pressure control, low added sugars for blood sugar stabilization, and low added fats and oils for coronary artery disease risk reduction and weight management.  Calorie Density  Clinical staff conducted group or individual video education with verbal and written material and guidebook.  Patient learns about calorie density and how it impacts the Pritikin Eating Plan. Knowing the characteristics of the food you choose will help you decide whether those foods will lead to weight gain or weight loss, and whether you want to consume more or less of them. Weight loss is usually a side effect of the Pritikin Eating Plan because of its focus on low calorie-dense foods.  Label Reading  Clinical staff conducted group or individual video education with verbal and written material and guidebook.  Patient learns  about the Pritikin recommended label reading guidelines and corresponding recommendations regarding calorie density, added sugars, sodium content, and whole grains.  Dining Out - Part 1  Clinical staff conducted group or individual video education with verbal and written material and guidebook.  Patient learns that restaurant meals can be sabotaging because they can be so high in calories, fat, sodium, and/or sugar. Patient learns recommended strategies on how to positively address this and avoid unhealthy pitfalls.  Facts on Fats  Clinical staff conducted group or individual video education with verbal and written material and guidebook.  Patient learns that lifestyle modifications can be just as effective, if not more so, as many  medications for lowering your risk of heart disease. A Pritikin lifestyle can help to reduce your risk of inflammation and atherosclerosis (cholesterol build-up, or plaque, in the artery walls). Lifestyle interventions such as dietary choices and physical activity address the cause of atherosclerosis. A review of the types of fats and their impact on blood cholesterol levels, along with dietary recommendations to reduce fat intake is also included.  Nutrition Action Plan  Clinical staff conducted group or individual video education with verbal and written material and guidebook.  Patient learns how to incorporate Pritikin recommendations into their lifestyle. Recommendations include planning and keeping personal health goals in mind as an important part of their success.  Healthy Mind-Set    Healthy Minds, Bodies, Hearts  Clinical staff conducted group or individual video education with verbal and written material and guidebook.  Patient learns how to identify when they are stressed. Video will discuss the impact of that stress, as well as the many benefits of stress management. Patient will also be introduced to stress management techniques. The way we think, act, and  feel has an impact on our hearts.  How Our Thoughts Can Heal Our Hearts  Clinical staff conducted group or individual video education with verbal and written material and guidebook.  Patient learns that negative thoughts can cause depression and anxiety. This can result in negative lifestyle behavior and serious health problems. Cognitive behavioral therapy is an effective method to help control our thoughts in order to change and improve our emotional outlook.  Additional Videos:  Exercise    Improving Performance  Clinical staff conducted group or individual video education with verbal and written material and guidebook.  Patient learns to use a non-linear approach by alternating intensity levels and lengths of time spent exercising to help burn more calories and lose more body fat. Cardiovascular exercise helps improve heart health, metabolism, hormonal balance, blood sugar control, and recovery from fatigue. Resistance training improves strength, endurance, balance, coordination, reaction time, metabolism, and muscle mass. Flexibility exercise improves circulation, posture, and balance. Seek guidance from your physician and exercise physiologist before implementing an exercise routine and learn your capabilities and proper form for all exercise.  Introduction to Yoga  Clinical staff conducted group or individual video education with verbal and written material and guidebook.  Patient learns about yoga, a discipline of the coming together of mind, breath, and body. The benefits of yoga include improved flexibility, improved range of motion, better posture and core strength, increased lung function, weight loss, and positive self-image. Yoga's heart health benefits include lowered blood pressure, healthier heart rate, decreased cholesterol and triglyceride levels, improved immune function, and reduced stress. Seek guidance from your physician and exercise physiologist before implementing an exercise  routine and learn your capabilities and proper form for all exercise.  Medical   Aging: Enhancing Your Quality of Life  Clinical staff conducted group or individual video education with verbal and written material and guidebook.  Patient learns key strategies and recommendations to stay in good physical health and enhance quality of life, such as prevention strategies, having an advocate, securing a Health Care Proxy and Power of Attorney, and keeping a list of medications and system for tracking them. It also discusses how to avoid risk for bone loss.  Biology of Weight Control  Clinical staff conducted group or individual video education with verbal and written material and guidebook.  Patient learns that weight gain occurs because we consume more calories than we burn (eating more, moving less). Even  if your body weight is normal, you may have higher ratios of fat compared to muscle mass. Too much body fat puts you at increased risk for cardiovascular disease, heart attack, stroke, type 2 diabetes, and obesity-related cancers. In addition to exercise, following the [place] can help reduce your risk.  Decoding Lab Results  Clinical staff conducted group or individual video education with verbal and written material and guidebook.  Patient learns that lab test reflects one measurement whose values change over time and are influenced by many factors, including medication, stress, sleep, exercise, food, hydration, pre-existing medical conditions, and more. It is recommended to use the knowledge from this video to become more involved with your lab results and evaluate your numbers to speak with your doctor.   Diseases of Our Time - Overview  Clinical staff conducted group or individual video education with verbal and written material and guidebook.  Patient learns that according to the CDC, 50% to 70% of chronic diseases (such as obesity, type 2 diabetes, elevated lipids, hypertension,  and heart disease) are avoidable through lifestyle improvements including healthier food choices, listening to satiety cues, and increased physical activity.  Sleep Disorders Clinical staff conducted group or individual video education with verbal and written material and guidebook.  Patient learns how good quality and duration of sleep are important to overall health and well-being. Patient also learns about sleep disorders and how they impact health along with recommendations to address them, including discussing with a physician.  Nutrition  Dining Out - Part 2 Clinical staff conducted group or individual video education with verbal and written material and guidebook.  Patient learns how to plan ahead and communicate in order to maximize their dining experience in a healthy and nutritious manner. Included are recommended food choices based on the type of restaurant the patient is visiting.   Fueling a Best boy conducted group or individual video education with verbal and written material and guidebook.  There is a strong connection between our food choices and our health. Diseases like obesity and type 2 diabetes are very prevalent and are in large-part due to lifestyle choices. The Pritikin Eating Plan provides plenty of food and hunger-curbing satisfaction. It is easy to follow, affordable, and helps reduce health risks.  Menu Workshop  Clinical staff conducted group or individual video education with verbal and written material and guidebook.  Patient learns that restaurant meals can sabotage health goals because they are often packed with calories, fat, sodium, and sugar. Recommendations include strategies to plan ahead and to communicate with the manager, chef, or server to help order a healthier meal.  Planning Your Eating Strategy  Clinical staff conducted group or individual video education with verbal and written material and guidebook.  Patient learns about the  [place] and its benefit of reducing the risk of disease. The [place] does not focus on calories. Instead, it emphasizes high-quality, nutrient-rich foods. By knowing the characteristics of the foods, we choose, we can determine their calorie density and make informed decisions.  Targeting Your Nutrition Priorities  Clinical staff conducted group or individual video education with verbal and written material and guidebook.  Patient learns that lifestyle habits have a tremendous impact on disease risk and progression. This video provides eating and physical activity recommendations based on your personal health goals, such as reducing LDL cholesterol, losing weight, preventing or controlling type 2 diabetes, and reducing high blood pressure.  Vitamins and Minerals  Clinical staff  conducted group or individual video education with verbal and written material and guidebook.  Patient learns different ways to obtain key vitamins and minerals, including through a recommended healthy diet. It is important to discuss all supplements you take with your doctor.   Healthy Mind-Set    Smoking Cessation  Clinical staff conducted group or individual video education with verbal and written material and guidebook.  Patient learns that cigarette smoking and tobacco addiction pose a serious health risk which affects millions of people. Stopping smoking will significantly reduce the risk of heart disease, lung disease, and many forms of cancer. Recommended strategies for quitting are covered, including working with your doctor to develop a successful plan.  Culinary   Becoming a Financial trader conducted group or individual video education with verbal and written material and guidebook.  Patient learns that cooking at home can be healthy, cost-effective, quick, and puts them in control. Keys to cooking healthy recipes will include looking at your recipe, assessing your  equipment needs, planning ahead, making it simple, choosing cost-effective seasonal ingredients, and limiting the use of added fats, salts, and sugars.  Cooking - Breakfast and Snacks  Clinical staff conducted group or individual video education with verbal and written material and guidebook.  Patient learns how important breakfast is to satiety and nutrition through the entire day. Recommendations include key foods to eat during breakfast to help stabilize blood sugar levels and to prevent overeating at meals later in the day. Planning ahead is also a key component.  Cooking - Human resources officer conducted group or individual video education with verbal and written material and guidebook.  Patient learns eating strategies to improve overall health, including an approach to cook more at home. Recommendations include thinking of animal protein as a side on your plate rather than center stage and focusing instead on lower calorie dense options like vegetables, fruits, whole grains, and plant-based proteins, such as beans. Making sauces in large quantities to freeze for later and leaving the skin on your vegetables are also recommended to maximize your experience.  Cooking - Healthy Salads and Dressing Clinical staff conducted group or individual video education with verbal and written material and guidebook.  Patient learns that vegetables, fruits, whole grains, and legumes are the foundations of the [place]. Recommendations include how to incorporate each of these in flavorful and healthy salads, and how to create homemade salad dressings. Proper handling of ingredients is also covered. Cooking - Soups and [employer] - Soups and Desserts Clinical staff conducted group or individual video education with verbal and written material and guidebook.  Patient learns that Pritikin soups and desserts make for easy, nutritious, and delicious snacks and meal components that are  low in sodium, fat, sugar, and calorie density, while high in vitamins, minerals, and filling fiber. Recommendations include simple and healthy ideas for soups and desserts.   Overview     The Pritikin Solution Program Overview Clinical staff conducted group or individual video education with verbal and written material and guidebook.  Patient learns that the results of the [place] Program have been documented in more than 100 articles published in peer-reviewed journals, and the benefits include reducing risk factors for (and, in some cases, even reversing) high cholesterol, high blood pressure, type 2 diabetes, obesity, and more! An overview of the three key pillars of the Pritikin Program will be covered: eating well, doing regular exercise, and having a healthy mind-set.  WORKSHOPS  Exercise: Exercise Basics: Building Your Action Plan Clinical staff led group instruction and group discussion with PowerPoint presentation and patient guidebook. To enhance the learning environment the use of posters, models and videos may be added. At the conclusion of this workshop, patients will comprehend the difference between physical activity and exercise, as well as the benefits of incorporating both, into their routine. Patients will understand the FITT (Frequency, Intensity, Time, and Type) principle and how to use it to build an exercise action plan. In addition, safety concerns and other considerations for exercise and cardiac rehab will be addressed by the presenter. The purpose of this lesson is to promote a comprehensive and effective weekly exercise routine in order to improve patients' overall level of fitness.   Managing Heart Disease: Your Path to a Healthier Heart Clinical staff led group instruction and group discussion with PowerPoint presentation and patient guidebook. To enhance the learning environment the use of posters, models and videos may be added.At the conclusion of this workshop,  patients will understand the anatomy and physiology of the heart. Additionally, they will understand how Pritikin's three pillars impact the risk factors, the progression, and the management of heart disease.  The purpose of this lesson is to provide a high-level overview of the heart, heart disease, and how the Pritikin lifestyle positively impacts risk factors.  Exercise Biomechanics Clinical staff led group instruction and group discussion with PowerPoint presentation and patient guidebook. To enhance the learning environment the use of posters, models and videos may be added. Patients will learn how the structural parts of their bodies function and how these functions impact their daily activities, movement, and exercise. Patients will learn how to promote a neutral spine, learn how to manage pain, and identify ways to improve their physical movement in order to promote healthy living. The purpose of this lesson is to expose patients to common physical limitations that impact physical activity. Participants will learn practical ways to adapt and manage aches and pains, and to minimize their effect on regular exercise. Patients will learn how to maintain good posture while sitting, walking, and lifting.  Balance Training and Fall Prevention  Clinical staff led group instruction and group discussion with PowerPoint presentation and patient guidebook. To enhance the learning environment the use of posters, models and videos may be added. At the conclusion of this workshop, patients will understand the importance of their sensorimotor skills (vision, proprioception, and the vestibular system) in maintaining their ability to balance as they age. Patients will apply a variety of balancing exercises that are appropriate for their current level of function. Patients will understand the common causes for poor balance, possible solutions to these problems, and ways to modify their physical environment  in order to minimize their fall risk. The purpose of this lesson is to teach patients about the importance of maintaining balance as they age and ways to minimize their risk of falling.  WORKSHOPS   Nutrition:  Fueling a Scientist, research (physical sciences) led group instruction and group discussion with PowerPoint presentation and patient guidebook. To enhance the learning environment the use of posters, models and videos may be added. Patients will review the foundational principles of the [place] and understand what constitutes a serving size in each of the food groups. Patients will also learn Pritikin-friendly foods that are better choices when away from home and review make-ahead meal and snack options. Calorie density will be reviewed and applied to three nutrition priorities: weight maintenance, weight loss, and weight  gain. The purpose of this lesson is to reinforce (in a group setting) the key concepts around what patients are recommended to eat and how to apply these guidelines when away from home by planning and selecting Pritikin-friendly options. Patients will understand how calorie density may be adjusted for different weight management goals.  Mindful Eating  Clinical staff led group instruction and group discussion with PowerPoint presentation and patient guidebook. To enhance the learning environment the use of posters, models and videos may be added. Patients will briefly review the concepts of the [place] and the importance of low-calorie dense foods. The concept of mindful eating will be introduced as well as the importance of paying attention to internal hunger signals. Triggers for non-hunger eating and techniques for dealing with triggers will be explored. The purpose of this lesson is to provide patients with the opportunity to review the basic principles of the [place], discuss the value of eating mindfully and how to measure internal cues of hunger  and fullness using the Hunger Scale. Patients will also discuss reasons for non-hunger eating and learn strategies to use for controlling emotional eating.  Targeting Your Nutrition Priorities Clinical staff led group instruction and group discussion with PowerPoint presentation and patient guidebook. To enhance the learning environment the use of posters, models and videos may be added. Patients will learn how to determine their genetic susceptibility to disease by reviewing their family history. Patients will gain insight into the importance of diet as part of an overall healthy lifestyle in mitigating the impact of genetics and other environmental insults. The purpose of this lesson is to provide patients with the opportunity to assess their personal nutrition priorities by looking at their family history, their own health history and current risk factors. Patients will also be able to discuss ways of prioritizing and modifying the [place] for their highest risk areas  Menu  Clinical staff led group instruction and group discussion with PowerPoint presentation and patient guidebook. To enhance the learning environment the use of posters, models and videos may be added. Using menus brought in from [employer], or printed from [employer], patients will apply the [place] dining out guidelines that were presented in the [employer] video. Patients will also be able to practice these guidelines in a variety of provided scenarios. The purpose of this lesson is to provide patients with the opportunity to practice hands-on learning of the [place] with actual menus and practice scenarios.  Label Reading Clinical staff led group instruction and group discussion with PowerPoint presentation and patient guidebook. To enhance the learning environment the use of posters, models and videos may be added. Patients will review and discuss the Pritikin label  reading guidelines presented in Pritikin's Label Reading Educational series video. Using fool labels brought in from local grocery stores and markets, patients will apply the label reading guidelines and determine if the packaged food meet the Pritikin guidelines. The purpose of this lesson is to provide patients with the opportunity to review, discuss, and practice hands-on learning of the Pritikin Label Reading guidelines with actual packaged food labels. [place] Workshops are designed to teach patients ways to prepare quick, simple, and affordable recipes at home. The importance of nutrition's role in chronic disease risk reduction is reflected in its emphasis in the overall Pritikin program. By learning how to prepare essential core Pritikin Eating Plan recipes, patients will increase control over what they  eat; be able to customize the flavor of foods without the use of added salt, sugar, or fat; and improve the quality of the food they consume. By learning a set of core recipes which are easily assembled, quickly prepared, and affordable, patients are more likely to prepare more healthy foods at home. These workshops focus on convenient breakfasts, simple entres, side dishes, and desserts which can be prepared with minimal effort and are consistent with nutrition recommendations for cardiovascular risk reduction. Cooking [employer] are taught by a Engineer, materials (RD) who has been trained by the [employer]. The chef or RD has a clear understanding of the importance of minimizing - if not completely eliminating - added fat, sugar, and sodium in recipes. Throughout the series of [place] Workshop sessions, patients will learn about healthy ingredients and efficient methods of cooking to build confidence in their capability to prepare    Cooking School weekly topics:  Adding Flavor- Sodium-Free  Fast and Healthy  Breakfasts  Powerhouse Plant-Based Proteins  Satisfying Salads and Dressings  Simple Sides and Sauces  International Cuisine-Spotlight on the [place] Zones  Delicious Desserts  Savory Soups  Efficiency Cooking - Meals in a Snap  Tasty Appetizers and Snacks  Comforting Weekend Breakfasts  One-Pot Wonders   Fast Evening Meals  Easy [place] (Psychosocial): New Thoughts, New Behaviors Clinical staff led group instruction and group discussion with PowerPoint presentation and patient guidebook. To enhance the learning environment the use of posters, models and videos may be added. Patients will learn and practice techniques for developing effective health and lifestyle goals. Patients will be able to effectively apply the goal setting process learned to develop at least one new personal goal.  The purpose of this lesson is to expose patients to a new skill set of behavior modification techniques such as techniques setting SMART goals, overcoming barriers, and achieving new thoughts and new behaviors.  Managing Moods and Relationships Clinical staff led group instruction and group discussion with PowerPoint presentation and patient guidebook. To enhance the learning environment the use of posters, models and videos may be added. Patients will learn how emotional and chronic stress factors can impact their health and relationships. They will learn healthy ways to manage their moods and utilize positive coping mechanisms. In addition, ICR patients will learn ways to improve communication skills. The purpose of this lesson is to expose patients to ways of understanding how one's mood and health are intimately connected. Developing a healthy outlook can help build positive relationships and connections with others. Patients will understand the importance of utilizing effective communication skills that include actively listening and being  heard. They will learn and understand the importance of the "4 Cs" and especially Connections in fostering of a Healthy Mind-Set.  Healthy Sleep for a Healthy Heart Clinical staff led group instruction and group discussion with PowerPoint presentation and patient guidebook. To enhance the learning environment the use of posters, models and videos may be added. At the conclusion of this workshop, patients will be able to demonstrate knowledge of the importance of sleep to overall health, well-being, and quality of life. They will understand the symptoms of, and treatments for, common sleep disorders. Patients will also be able to identify daytime and nighttime behaviors which impact sleep, and they will be able to apply these tools to help manage sleep-related challenges. The purpose of this lesson is to provide patients with a  general overview of sleep and outline the importance of quality sleep. Patients will learn about a few of the most common sleep disorders. Patients will also be introduced to the concept of "sleep hygiene," and discover ways to self-manage certain sleeping problems through simple daily behavior changes. Finally, the workshop will motivate patients by clarifying the links between quality sleep and their goals of heart-healthy living.   Recognizing and Reducing Stress Clinical staff led group instruction and group discussion with PowerPoint presentation and patient guidebook. To enhance the learning environment the use of posters, models and videos may be added. At the conclusion of this workshop, patients will be able to understand the types of stress reactions, differentiate between acute and chronic stress, and recognize the impact that chronic stress has on their health. They will also be able to apply different coping mechanisms, such as reframing negative self-talk. Patients will have the opportunity to practice a variety of stress management techniques, such as deep abdominal  breathing, progressive muscle relaxation, and/or guided imagery.  The purpose of this lesson is to educate patients on the role of stress in their lives and to provide healthy techniques for coping with it.  Learning Barriers/Preferences:  Learning Barriers/Preferences - 01/05/22 1458       Learning Barriers/Preferences   Learning Barriers Sight   wears glasses   Learning Preferences Computer/Internet;Audio;Group Instruction;Individual Instruction;Pictoral;Skilled Demonstration;Verbal Instruction;Video;Written Material             Education Topics:  Knowledge Questionnaire Score:  Knowledge Questionnaire Score - 01/05/22 1458       Knowledge Questionnaire Score   Pre Score 20/24             Core Components/Risk Factors/Patient Goals at Admission:  Personal Goals and Risk Factors at Admission - 01/05/22 1458       Core Components/Risk Factors/Patient Goals on Admission    Weight Management Yes    Intervention Weight Management: Develop a combined nutrition and exercise program designed to reach desired caloric intake, while maintaining appropriate intake of nutrient and fiber, sodium and fats, and appropriate energy expenditure required for the weight goal.;Weight Management: Provide education and appropriate resources to help participant work on and attain dietary goals.    Expected Outcomes Short Term: Continue to assess and modify interventions until short term weight is achieved;Long Term: Adherence to nutrition and physical activity/exercise program aimed toward attainment of established weight goal;Understanding recommendations for meals to include 15-35% energy as protein, 25-35% energy from fat, 35-60% energy from carbohydrates, less than 200mg  of dietary cholesterol, 20-35 gm of total fiber daily;Understanding of distribution of calorie intake throughout the day with the consumption of 4-5 meals/snacks    Heart Failure Yes    Intervention Provide a combined exercise  and nutrition program that is supplemented with education, support and counseling about heart failure. Directed toward relieving symptoms such as shortness of breath, decreased exercise tolerance, and extremity edema.    Expected Outcomes Improve functional capacity of life;Short term: Attendance in program 2-3 days a week with increased exercise capacity. Reported lower sodium intake. Reported increased fruit and vegetable intake. Reports medication compliance.;Short term: Daily weights obtained and reported for increase. Utilizing diuretic protocols set by physician.;Long term: Adoption of self-care skills and reduction of barriers for early signs and symptoms recognition and intervention leading to self-care maintenance.    Hypertension Yes    Intervention Provide education on lifestyle modifcations including regular physical activity/exercise, weight management, moderate sodium restriction and increased consumption of fresh fruit, vegetables, and  low fat dairy, alcohol moderation, and smoking cessation.;Monitor prescription use compliance.    Expected Outcomes Short Term: Continued assessment and intervention until BP is < 140/44mm HG in hypertensive participants. < 130/70mm HG in hypertensive participants with diabetes, heart failure or chronic kidney disease.;Long Term: Maintenance of blood pressure at goal levels.    Lipids Yes    Intervention Provide education and support for participant on nutrition & aerobic/resistive exercise along with prescribed medications to achieve LDL 70mg , HDL >40mg .    Expected Outcomes Short Term: Participant states understanding of desired cholesterol values and is compliant with medications prescribed. Participant is following exercise prescription and nutrition guidelines.;Long Term: Cholesterol controlled with medications as prescribed, with individualized exercise RX and with personalized nutrition plan. Value goals: LDL < 70mg , HDL > 40 mg.             Core  Components/Risk Factors/Patient Goals Review:    Core Components/Risk Factors/Patient Goals at Discharge (Final Review):    ITP Comments:  ITP Comments     Row Name 01/05/22 1536           ITP Comments Dr Fransico Him MD, Medical Director, Introduction to Pritikin Education Program/ Intensive cardiac rehab. Inital orientatin packet reviewed with the patient                Comments: Participant attended orientation for the cardiac rehabilitation program on  01/05/2022  to perform initial intake and exercise walk test. Patient introduced to the [place] education and orientation packet was reviewed. Completed 6-minute walk test, measurements, initial ITP, and exercise prescription. Vital signs stable. Telemetry-normal sinus rhythm resting sinus brady cardiac,  See previous note at [facility] was called about sinus brady and stopped coreg. Harrell Gave RN BSN   Service time was from 1032 to 1310.

## 2022-01-10 ENCOUNTER — Telehealth (HOSPITAL_COMMUNITY): Payer: Self-pay

## 2022-01-10 NOTE — Telephone Encounter (Signed)
Returned pt phone message in regards to cardiac rehab start dates. Left a message for pt to call back.

## 2022-01-11 ENCOUNTER — Encounter (HOSPITAL_COMMUNITY): Payer: BC Managed Care – PPO

## 2022-01-11 MED ORDER — SACUBITRIL-VALSARTAN 24-26 MG PO TABS: 1.0000 | ORAL_TABLET | Freq: Two times a day (BID) | ORAL | 11 refills | Status: DC

## 2022-01-13 ENCOUNTER — Encounter (HOSPITAL_COMMUNITY): Payer: BC Managed Care – PPO

## 2022-01-17 NOTE — Progress Notes (Signed)
Cardiac Individual Treatment Plan  Patient Details  Name: Alex Arias MRN: [id number] Date of Birth: Jan 23, 1955 Referring Provider:   Flowsheet Row INTENSIVE CARDIAC REHAB ORIENT [facility]  Referring Provider Bryan Lemmaavid Harding, MD       Initial Encounter Date:  Flowsheet Row INTENSIVE CARDIAC REHAB ORIENT [facility]  Date 01/05/22       Visit Diagnosis: 10/30/21 NSTEMI  10/30/21 S/P DES LAD  Patient's Home Medications on Admission:  Current Outpatient Medications:    apixaban (ELIQUIS) 5 MG TABS tablet, Take 1 tablet (5 mg total) by mouth 2 (two) times daily., Disp: 60 tablet, Rfl: 3   atorvastatin (LIPITOR) 40 MG tablet, Take 1 tablet (40 mg total) by mouth daily., Disp: 90 tablet, Rfl: 0   carvedilol (COREG) 3.125 MG tablet, Take 1 tablet (3.125 mg total) by mouth 2 (two) times daily with a meal., Disp: 180 tablet, Rfl: 0   Cholecalciferol (VITAMIN D3 PO), Take 1 capsule by mouth every evening., Disp: , Rfl:    clopidogrel (PLAVIX) 75 MG tablet, Take 1 tablet (75 mg total) by mouth daily., Disp: 90 tablet, Rfl: 2   empagliflozin (JARDIANCE) 10 MG TABS tablet, Take 1 tablet (10 mg total) by mouth daily., Disp: 30 tablet, Rfl: 2   levothyroxine (SYNTHROID) 88 MCG tablet, Take 88 mcg by mouth daily before breakfast., Disp: , Rfl:    Multiple Vitamin (MULITIVITAMIN WITH MINERALS) TABS, Take 1 tablet by mouth every evening., Disp: , Rfl:    nitroGLYCERIN (NITROSTAT) 0.4 MG SL tablet, Place 1 tablet (0.4 mg total) under the tongue every 5 (five) minutes as needed for chest pain., Disp: 25 tablet, Rfl: 2   pantoprazole (PROTONIX) 40 MG tablet, Take 40 mg by mouth in the morning., Disp: , Rfl:    spironolactone (ALDACTONE) 25 MG tablet, Take 1/2 tablet (12.5 mg total) by mouth daily., Disp: 45 tablet, Rfl: 1   Study - EVOLVE-MI -  evolocumab (REPATHA) 140 mg/mL SQ injection (PI-Stuckey), Inject 140 mg into the skin every 14 (fourteen) days. For Investigational Use Only. Inject subcutaneously into abdomen, thigh, or upper arm every 14 days. Rotate injection sites and do not inject into areas where skin is tender, bruised, or red. Please contact [facility] for any questions or concerns regarding this medication., Disp: , Rfl:    tadalafil (CIALIS) 5 MG tablet, Take 5 mg by mouth every evening., Disp: , Rfl:    amiodarone (PACERONE) 200 MG tablet, Take 2 tablets (400 mg total) by mouth 2 (two) times daily for 5 days, THEN 1 tablet (200 mg total) 2 (two) times daily for 5 days, THEN 1 tablet (200 mg total) daily. (Patient not taking: Reported on 01/04/2022), Disp: 60 tablet, Rfl: 0   potassium chloride SA (KLOR-CON M) 20 MEQ tablet, Take 1 tablet (20 mEq total) by mouth daily. (Patient not taking: Reported on 01/04/2022), Disp: 60 tablet, Rfl: 0   sacubitril-valsartan (ENTRESTO) 24-26 MG, Take 1 tablet by mouth 2 (two) times daily., Disp: 60 tablet, Rfl: 11  Past Medical History: Past Medical History:  Diagnosis Date   Abdominal distension    Abdominal pain    Arrhythmia    Coronary artery disease involving native coronary artery of native heart with unstable angina pectoris (HCC) 10/30/2021   Cath-PCI 10/30/2021: 99% thrombotic SubTO of mLAD just after D2 -> DES PCI 3.0 x  37 Synergy XD => deployed to 3.0 mm.  2 big Diags, LCx-OM1 & Large Dominant RCA - rPDA & PAV-RPL1-3 free of disease. EF ~40-45%, LVEDP 21--25 mmHg mid-apical anterior & apical HK.   Cough    Family history of diabetes mellitus    Itching    NSTEMI (non-ST elevated myocardial infarction) ([place]) - + Troponin with Anterior & Inferior ST depressions => ongoing CP => Urgent Cath 10/30/2021   Cath with 99% subTO of LAD => DES PCI   Pleurisy    Presence of drug coated stent in LAD coronary artery 10/30/2021   mLAD 99% thrombotic lesion: DES PCI w/  Synergy XD 3 x 16 => deployed ~3-3.1 mm   Right inguinal hernia    Unspecified viral infection, in conditions classified elsewhere and of unspecified site    viral syndrome - per notes from [facility] dated 01/03/11    Tobacco Use: Social History   Tobacco Use  Smoking Status Never  Smokeless Tobacco Never    Labs: Review Flowsheet       Latest Ref Rng & Units 04/02/2014 07/24/2014 10/31/2021  Labs for ITP Cardiac and Pulmonary Rehab  Cholestrol 0 - 200 mg/dL - - 122   LDL (calc) 0 - 99 mg/dL - - 57   HDL-C >40 mg/dL - - 44   Trlycerides <150 mg/dL - - 106   Hemoglobin A1c 4.8 - 5.6 % - - 5.1   TCO2 0 - 100 mmol/L 19  27  -    Capillary Blood Glucose: No results found for: "GLUCAP"   Exercise Target Goals: Exercise Program Goal: Individual exercise prescription set using results from initial 6 min walk test and THRR while considering  patient's activity barriers and safety.   Exercise Prescription Goal: Initial exercise prescription builds to 30-45 minutes a day of aerobic activity, 2-3 days per week.  Home exercise guidelines will be given to patient during program as part of exercise prescription that the participant will acknowledge.  Activity Barriers & Risk Stratification:  Activity Barriers & Cardiac Risk Stratification - 01/05/22 1443       Activity Barriers & Cardiac Risk Stratification   Activity Barriers Decreased Ventricular Function    Cardiac Risk Stratification High   <5 METs on 6MWT            6 Minute Walk:  6 Minute Walk     Row Name 01/05/22 1442         6 Minute Walk   Phase Initial     Distance 1628 feet     Walk Time 6 minutes     # of Rest Breaks 0     MPH 3.08     METS 3.41     RPE 11     Perceived Dyspnea  0     VO2 Peak 11.94     Symptoms No     Resting HR 47 bpm     Resting BP 116/78     Resting Oxygen Saturation  99 %     Exercise Oxygen Saturation  during 6 min walk 100 %     Max Ex. HR 76 bpm     Max Ex. BP 122/80      2 Minute Post BP 116/76              Oxygen Initial Assessment:   Oxygen Re-Evaluation:   Oxygen Discharge (Final Oxygen Re-Evaluation):   Initial Exercise Prescription:  Initial Exercise Prescription - 01/05/22 1400  Date of Initial Exercise RX and Referring Provider   Date 01/05/22    Referring Provider Bryan Lemma, MD    Expected Discharge Date 03/03/22      NuStep   Level 3    SPM 75    Minutes 15    METs 3      Elliptical   Level 1    Minutes 15    METs 3      Prescription Details   Frequency (times per week) 3    Duration Progress to 30 minutes of continuous aerobic without signs/symptoms of physical distress      Intensity   THRR 40-80% of Max Heartrate 60-121    Ratings of Perceived Exertion 11-13    Perceived Dyspnea 0-4      Progression   Progression Continue progressive overload as per policy without signs/symptoms or physical distress.      Resistance Training   Training Prescription Yes    Weight 4.    Reps 10-15             Perform Capillary Blood Glucose checks as needed.  Exercise Prescription Changes:   Exercise Comments:   Exercise Goals and Review:   Exercise Goals     Row Name 01/05/22 1452             Exercise Goals   Increase Physical Activity Yes       Intervention Provide advice, education, support and counseling about physical activity/exercise needs.;Develop an individualized exercise prescription for aerobic and resistive training based on initial evaluation findings, risk stratification, comorbidities and participant's personal goals.       Expected Outcomes Short Term: Attend rehab on a regular basis to increase amount of physical activity.;Long Term: Exercising regularly at least 3-5 days a week.;Long Term: Add in home exercise to make exercise part of routine and to increase amount of physical activity.       Increase Strength and Stamina Yes       Intervention Provide advice, education,  support and counseling about physical activity/exercise needs.;Develop an individualized exercise prescription for aerobic and resistive training based on initial evaluation findings, risk stratification, comorbidities and participant's personal goals.       Expected Outcomes Short Term: Increase workloads from initial exercise prescription for resistance, speed, and METs.;Short Term: Perform resistance training exercises routinely during rehab and add in resistance training at home;Long Term: Improve cardiorespiratory fitness, muscular endurance and strength as measured by increased METs and functional capacity ( )       Able to understand and use rate of perceived exertion (RPE) scale Yes       Intervention Provide education and explanation on how to use RPE scale       Expected Outcomes Short Term: Able to use RPE daily in rehab to express subjective intensity level;Long Term:  Able to use RPE to guide intensity level when exercising independently       Knowledge and understanding of Target Heart Rate Range (THRR) Yes       Intervention Provide education and explanation of THRR including how the numbers were predicted and where they are located for reference       Expected Outcomes Short Term: Able to state/look up THRR;Short Term: Able to use daily as guideline for intensity in rehab;Long Term: Able to use THRR to govern intensity when exercising independently       Understanding of Exercise Prescription Yes       Intervention Provide education, explanation, and written materials  on patient's individual exercise prescription       Expected Outcomes Short Term: Able to explain program exercise prescription;Long Term: Able to explain home exercise prescription to exercise independently                Exercise Goals Re-Evaluation :   Discharge Exercise Prescription (Final Exercise Prescription Changes):   Nutrition:  Target Goals: Understanding of nutrition guidelines, daily intake of  sodium 1500mg , cholesterol 200mg , calories 30% from fat and 7% or less from saturated fats, daily to have 5 or more servings of fruits and vegetables.  Biometrics:  Pre Biometrics - 01/05/22 1439       Pre Biometrics   Waist Circumference 43 inches    Hip Circumference 43.5 inches    Waist to Hip Ratio 0.99 %    Triceps Skinfold 13 mm    % Body Fat 27.7 %    Grip Strength 42 kg    Flexibility 15.75 in    Single Leg Stand 20.93 seconds              Nutrition Therapy Plan and Nutrition Goals:   Nutrition Assessments:  Nutrition Assessments - 01/05/22 1320       Rate Your Plate Scores   Pre Score 54            MEDIFICTS Score Key: ?70 Need to make dietary changes  40-70 Heart Healthy Diet ? 40 Therapeutic Level Cholesterol Diet   Flowsheet Row INTENSIVE CARDIAC REHAB ORIENT from 01/05/2022 in [facility]  Picture Your Plate Total Score on Admission 54      Picture Your Plate Scores: <83 Unhealthy dietary pattern with much room for improvement. 41-50 Dietary pattern unlikely to meet recommendations for good health and room for improvement. 51-60 More healthful dietary pattern, with some room for improvement.  >60 Healthy dietary pattern, although there may be some specific behaviors that could be improved.    Nutrition Goals Re-Evaluation:   Nutrition Goals Re-Evaluation:   Nutrition Goals Discharge (Final Nutrition Goals Re-Evaluation):   Psychosocial: Target Goals: Acknowledge presence or absence of significant depression and/or stress, maximize coping skills, provide positive support system. Participant is able to verbalize types and ability to use techniques and skills needed for reducing stress and depression.  Initial Review & Psychosocial Screening:  Initial Psych Review & Screening - 01/05/22 1538       Initial Review   Current issues with None Identified      Family Dynamics    Good Support System? Yes   Ed has his wife and two daughter for support     Barriers   Psychosocial barriers to participate in program There are no identifiable barriers or psychosocial needs.      Screening Interventions   Interventions Encouraged to exercise             Quality of Life Scores:  Quality of Life - 01/05/22 1453       Quality of Life   Select Quality of Life      Quality of Life Scores   Health/Function Pre 26.73 %    Socioeconomic Pre 25.42 %    Psych/Spiritual Pre 27.71 %    Family Pre 26.4 %    GLOBAL Pre 26.65 %            Scores of 19 and below usually indicate a poorer quality of life in these areas.  A difference of  2-3 points  is a clinically meaningful difference.  A difference of 2-3 points in the total score of the Quality of Life Index has been associated with significant improvement in overall quality of life, self-image, physical symptoms, and general health in studies assessing change in quality of life.  PHQ-9: Review Flowsheet       01/05/2022  Depression screen PHQ 2/9  Decreased Interest 0  Down, Depressed, Hopeless 0  PHQ - 2 Score 0   Interpretation of Total Score  Total Score Depression Severity:  1-4 = Minimal depression, 5-9 = Mild depression, 10-14 = Moderate depression, 15-19 = Moderately severe depression, 20-27 = Severe depression   Psychosocial Evaluation and Intervention:   Psychosocial Re-Evaluation:   Psychosocial Discharge (Final Psychosocial Re-Evaluation):   Vocational Rehabilitation: Provide vocational rehab assistance to qualifying candidates.   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 Participant will state understanding/return demonstration of topics presented.     Core Videos: Exercise    Move It!  Clinical staff conducted group or individual video education with verbal and written material and guidebook.  Patient learns the recommended Pritikin exercise program. Exercise with the goal of living a long, healthy life. Some of the health benefits of exercise include controlled diabetes, healthier blood pressure levels, improved cholesterol levels, improved heart and lung capacity, improved sleep, and better body composition. Everyone should speak with their doctor before starting or changing an exercise routine.  Biomechanical Limitations Clinical staff conducted group or individual video education with verbal and written material and guidebook.  Patient learns how biomechanical limitations can impact exercise and how we can mitigate and possibly overcome limitations to have an impactful and balanced exercise routine.  Body Composition Clinical staff conducted group or individual video education with verbal and written material and guidebook.  Patient learns that body composition (ratio of muscle mass to fat mass) is a key component to assessing overall fitness, rather than body weight alone. Increased fat mass, especially visceral belly fat, can put [place] at increased risk for metabolic syndrome, type 2 diabetes, heart disease, and even death. It is recommended to combine diet and exercise (cardiovascular and resistance training) to improve your body composition. Seek guidance from your physician and exercise physiologist before implementing an exercise routine.  Exercise Action Plan Clinical staff conducted group or individual video education with verbal and written material and guidebook.  Patient learns the recommended strategies to achieve and enjoy long-term exercise adherence, including variety, self-motivation, self-efficacy, and positive  decision making. Benefits of exercise include fitness, good health, weight management, more energy, better sleep, less stress, and overall well-being.  Medical   Heart Disease Risk Reduction Clinical staff conducted group or individual video education with verbal and written material and guidebook.  Patient learns our heart is our most vital organ as it circulates oxygen, nutrients, white blood cells, and hormones throughout the entire body, and carries waste away. Data supports a plant-based eating plan like the Pritikin Program for its effectiveness in slowing progression of and reversing heart disease. The video provides a number of recommendations to address heart disease.   Metabolic Syndrome and Belly Fat  Clinical staff conducted group or individual video education with verbal and written material and guidebook.  Patient learns what metabolic syndrome is, how it leads to heart disease, and how one can reverse it and keep it from coming back. You have metabolic syndrome if you have 3 of the following 5 criteria: abdominal obesity, high blood pressure, high triglycerides, low HDL cholesterol, and high blood sugar.  Hypertension and Heart Disease Clinical staff conducted group or individual video education with verbal and written material and guidebook.  Patient learns that high blood pressure, or hypertension, is very common in the [place]. Hypertension is largely due to excessive salt intake, but other important risk factors include being overweight, physical inactivity, drinking too much alcohol, smoking, and not eating enough potassium from fruits and vegetables. High blood pressure is a leading risk factor for heart attack, stroke, congestive heart failure, dementia, kidney failure, and premature death. Long-term effects of excessive salt intake include stiffening of the arteries and thickening of heart muscle and organ damage. Recommendations include ways to reduce hypertension and the  risk of heart disease.  Diseases of Our Time - Focusing on Diabetes Clinical staff conducted group or individual video education with verbal and written material and guidebook.  Patient learns why the best way to stop diseases of our time is prevention, through food and other lifestyle changes. Medicine (such as prescription pills and surgeries) is often only a Band-Aid on the problem, not a long-term solution. Most common diseases of our time include obesity, type 2 diabetes, hypertension, heart disease, and cancer. The Pritikin Program is recommended and has been proven to help reduce, reverse, and/or prevent the damaging effects of metabolic syndrome.  Nutrition   Overview of the Pritikin Eating Plan  Clinical staff conducted group or individual video education with verbal and written material and guidebook.  Patient learns about the Pritikin Eating Plan for disease risk reduction. The Pritikin Eating Plan emphasizes a wide variety of unrefined, minimally-processed carbohydrates, like fruits, vegetables, whole grains, and legumes. Go, Caution, and Stop food choices are explained. Plant-based and lean animal proteins are emphasized. Rationale provided for low sodium intake for blood pressure control, low added sugars for blood sugar stabilization, and low added fats and oils for coronary artery disease risk reduction and weight management.  Calorie Density  Clinical staff conducted group or individual video education with verbal and written material and guidebook.  Patient learns about calorie density and how it impacts the Pritikin Eating Plan. Knowing the characteristics of the food you choose will help you decide whether those foods will lead to weight gain or weight loss, and whether you want to consume more or less of them. Weight loss is usually a side effect of the Pritikin Eating Plan because of its focus on low calorie-dense foods.  Label Reading  Clinical staff conducted group or  individual video education with verbal and written material and guidebook.  Patient learns about the Pritikin recommended label reading guidelines and corresponding recommendations regarding calorie density, added sugars, sodium content, and whole grains.  Dining Out - Part 1  Clinical staff conducted group or individual video education with verbal and written material and guidebook.  Patient learns that restaurant meals can be sabotaging because they can be so high in calories, fat, sodium, and/or sugar. Patient learns recommended strategies on how to positively address this and avoid unhealthy pitfalls.  Facts on Fats  Clinical staff conducted group or individual video education with verbal and written material and guidebook.  Patient learns that lifestyle modifications can be just as effective, if not more so, as many medications for lowering your risk of heart disease. A Pritikin lifestyle can help to reduce your risk of inflammation and atherosclerosis (cholesterol build-up, or plaque, in the artery walls). Lifestyle interventions such as dietary choices and physical activity address the cause of atherosclerosis. A review of the types of fats and their impact on blood cholesterol levels, along with dietary recommendations to reduce fat intake is also included.  Nutrition Action Plan  Clinical staff conducted group or individual video education with verbal and written material and guidebook.  Patient learns how to incorporate Pritikin recommendations into their lifestyle. Recommendations include planning and keeping personal health goals in mind as an important part of their success.  Healthy Mind-Set    Healthy Minds, Bodies, Hearts  Clinical staff conducted group or individual video education with verbal and written material and guidebook.  Patient learns how to identify when they are stressed. Video will discuss the impact of that stress, as well as the many benefits of stress management.  Patient will also be introduced to stress management techniques. The way we think, act, and feel has an impact on our hearts.  How Our Thoughts Can Heal Our Hearts  Clinical staff conducted group or individual video education with verbal and written material and guidebook.  Patient learns that negative thoughts can cause depression and anxiety. This can result in negative lifestyle behavior and serious health problems. Cognitive behavioral therapy is an effective method to help control our thoughts in order to change and improve our emotional outlook.  Additional Videos:  Exercise    Improving Performance  Clinical staff conducted group or individual video education with verbal and written material and guidebook.  Patient learns to use a non-linear approach by alternating intensity levels and lengths of time spent exercising to help burn more calories and lose more body fat. Cardiovascular exercise helps improve heart health, metabolism, hormonal balance, blood sugar control, and recovery from fatigue. Resistance training improves strength, endurance, balance, coordination, reaction time, metabolism, and muscle mass. Flexibility exercise improves circulation, posture, and balance. Seek guidance from your physician and exercise physiologist before implementing an exercise routine and learn your capabilities and proper form for all exercise.  Introduction to Yoga  Clinical staff conducted group or individual video education with verbal and written material and guidebook.  Patient learns about yoga, a discipline of the coming together of mind, breath, and body. The benefits of yoga include improved flexibility, improved range of motion, better posture and core strength, increased lung function, weight loss, and positive self-image. Yoga's heart health benefits include lowered blood pressure, healthier heart rate, decreased cholesterol and triglyceride levels, improved immune function, and reduced stress.  Seek guidance from your physician and exercise physiologist before implementing an exercise routine and learn your capabilities and proper form for all exercise.  Medical   Aging: Enhancing Your Quality of Life  Clinical staff conducted group or individual video education with verbal and written material and guidebook.  Patient learns key strategies and recommendations to stay in good physical health and enhance quality of life, such as prevention strategies, having an advocate, securing a Health Care Proxy and Power of Attorney, and keeping a list of medications and system for tracking them. It also discusses how to avoid risk for bone loss.  Biology of Weight Control  Clinical staff conducted group or individual video education with verbal and written material and guidebook.  Patient learns that  weight gain occurs because we consume more calories than we burn (eating more, moving less). Even if your body weight is normal, you may have higher ratios of fat compared to muscle mass. Too much body fat puts you at increased risk for cardiovascular disease, heart attack, stroke, type 2 diabetes, and obesity-related cancers. In addition to exercise, following the Pritikin Eating Plan can help reduce your risk.  Decoding Lab Results  Clinical staff conducted group or individual video education with verbal and written material and guidebook.  Patient learns that lab test reflects one measurement whose values change over time and are influenced by many factors, including medication, stress, sleep, exercise, food, hydration, pre-existing medical conditions, and more. It is recommended to use the knowledge from this video to become more involved with your lab results and evaluate your numbers to speak with your doctor.   Diseases of Our Time - Overview  Clinical staff conducted group or individual video education with verbal and written material and guidebook.  Patient learns that according to the CDC, 50%  to 70% of chronic diseases (such as obesity, type 2 diabetes, elevated lipids, hypertension, and heart disease) are avoidable through lifestyle improvements including healthier food choices, listening to satiety cues, and increased physical activity.  Sleep Disorders Clinical staff conducted group or individual video education with verbal and written material and guidebook.  Patient learns how good quality and duration of sleep are important to overall health and well-being. Patient also learns about sleep disorders and how they impact health along with recommendations to address them, including discussing with a physician.  Nutrition  Dining Out - Part 2 Clinical staff conducted group or individual video education with verbal and written material and guidebook.  Patient learns how to plan ahead and communicate in order to maximize their dining experience in a healthy and nutritious manner. Included are recommended food choices based on the type of restaurant the patient is visiting.   Fueling a BankerHealthy Body  Clinical staff conducted group or individual video education with verbal and written material and guidebook.  There is a strong connection between our food choices and our health. Diseases like obesity and type 2 diabetes are very prevalent and are in large-part due to lifestyle choices. The Pritikin Eating Plan provides plenty of food and hunger-curbing satisfaction. It is easy to follow, affordable, and helps reduce health risks.  Menu Workshop  Clinical staff conducted group or individual video education with verbal and written material and guidebook.  Patient learns that restaurant meals can sabotage health goals because they are often packed with calories, fat, sodium, and sugar. Recommendations include strategies to plan ahead and to communicate with the manager, chef, or server to help order a healthier meal.  Planning Your Eating Strategy  Clinical staff conducted group or individual  video education with verbal and written material and guidebook.  Patient learns about the Pritikin Eating Plan and its benefit of reducing the risk of disease. The Pritikin Eating Plan does not focus on calories. Instead, it emphasizes high-quality, nutrient-rich foods. By knowing the characteristics of the foods, we choose, we can determine their calorie density and make informed decisions.  Targeting Your Nutrition Priorities  Clinical staff conducted group or individual video education with verbal and written material and guidebook.  Patient learns that lifestyle habits have a tremendous impact on disease risk and progression. This video provides eating and physical activity recommendations based on your personal health goals, such as reducing LDL cholesterol, losing weight, preventing or  controlling type 2 diabetes, and reducing high blood pressure.  Vitamins and Minerals  Clinical staff conducted group or individual video education with verbal and written material and guidebook.  Patient learns different ways to obtain key vitamins and minerals, including through a recommended healthy diet. It is important to discuss all supplements you take with your doctor.   Healthy Mind-Set    Smoking Cessation  Clinical staff conducted group or individual video education with verbal and written material and guidebook.  Patient learns that cigarette smoking and tobacco addiction pose a serious health risk which affects millions of people. Stopping smoking will significantly reduce the risk of heart disease, lung disease, and many forms of cancer. Recommended strategies for quitting are covered, including working with your doctor to develop a successful plan.  Culinary   Becoming a Set designerritikin Chef  Clinical staff conducted group or individual video education with verbal and written material and guidebook.  Patient learns that cooking at home can be healthy, cost-effective, quick, and puts them in control.  Keys to cooking healthy recipes will include looking at your recipe, assessing your equipment needs, planning ahead, making it simple, choosing cost-effective seasonal ingredients, and limiting the use of added fats, salts, and sugars.  Cooking - Breakfast and Snacks  Clinical staff conducted group or individual video education with verbal and written material and guidebook.  Patient learns how important breakfast is to satiety and nutrition through the entire day. Recommendations include key foods to eat during breakfast to help stabilize blood sugar levels and to prevent overeating at meals later in the day. Planning ahead is also a key component.  Cooking - Educational psychologistDinner and Sides  Clinical staff conducted group or individual video education with verbal and written material and guidebook.  Patient learns eating strategies to improve overall health, including an approach to cook more at home. Recommendations include thinking of animal protein as a side on your plate rather than center stage and focusing instead on lower calorie dense options like vegetables, fruits, whole grains, and plant-based proteins, such as beans. Making sauces in large quantities to freeze for later and leaving the skin on your vegetables are also recommended to maximize your experience.  Cooking - Healthy Salads and Dressing Clinical staff conducted group or individual video education with verbal and written material and guidebook.  Patient learns that vegetables, fruits, whole grains, and legumes are the foundations of the Pritikin Eating Plan. Recommendations include how to incorporate each of these in flavorful and healthy salads, and how to create homemade salad dressings. Proper handling of ingredients is also covered. Cooking - Soups and State FarmDesserts  Cooking - Soups and Desserts Clinical staff conducted group or individual video education with verbal and written material and guidebook.  Patient learns that Pritikin soups and  desserts make for easy, nutritious, and delicious snacks and meal components that are low in sodium, fat, sugar, and calorie density, while high in vitamins, minerals, and filling fiber. Recommendations include simple and healthy ideas for soups and desserts.   Overview     The Pritikin Solution Program Overview Clinical staff conducted group or individual video education with verbal and written material and guidebook.  Patient learns that the results of the Pritikin Program have been documented in more than 100 articles published in peer-reviewed journals, and the benefits include reducing risk factors for (and, in some cases, even reversing) high cholesterol, high blood pressure, type 2 diabetes, obesity, and more! An overview of the three key pillars of the Pritikin  Program will be covered: eating well, doing regular exercise, and having a healthy mind-set.  WORKSHOPS  Exercise: Exercise Basics: Building Your Action Plan Clinical staff led group instruction and group discussion with PowerPoint presentation and patient guidebook. To enhance the learning environment the use of posters, models and videos may be added. At the conclusion of this workshop, patients will comprehend the difference between physical activity and exercise, as well as the benefits of incorporating both, into their routine. Patients will understand the FITT (Frequency, Intensity, Time, and Type) principle and how to use it to build an exercise action plan. In addition, safety concerns and other considerations for exercise and cardiac rehab will be addressed by the presenter. The purpose of this lesson is to promote a comprehensive and effective weekly exercise routine in order to improve patients' overall level of fitness.   Managing Heart Disease: Your Path to a Healthier Heart Clinical staff led group instruction and group discussion with PowerPoint presentation and patient guidebook. To enhance the learning environment  the use of posters, models and videos may be added.At the conclusion of this workshop, patients will understand the anatomy and physiology of the heart. Additionally, they will understand how Pritikin's three pillars impact the risk factors, the progression, and the management of heart disease.  The purpose of this lesson is to provide a high-level overview of the heart, heart disease, and how the Pritikin lifestyle positively impacts risk factors.  Exercise Biomechanics Clinical staff led group instruction and group discussion with PowerPoint presentation and patient guidebook. To enhance the learning environment the use of posters, models and videos may be added. Patients will learn how the structural parts of their bodies function and how these functions impact their daily activities, movement, and exercise. Patients will learn how to promote a neutral spine, learn how to manage pain, and identify ways to improve their physical movement in order to promote healthy living. The purpose of this lesson is to expose patients to common physical limitations that impact physical activity. Participants will learn practical ways to adapt and manage aches and pains, and to minimize their effect on regular exercise. Patients will learn how to maintain good posture while sitting, walking, and lifting.  Balance Training and Fall Prevention  Clinical staff led group instruction and group discussion with PowerPoint presentation and patient guidebook. To enhance the learning environment the use of posters, models and videos may be added. At the conclusion of this workshop, patients will understand the importance of their sensorimotor skills (vision, proprioception, and the vestibular system) in maintaining their ability to balance as they age. Patients will apply a variety of balancing exercises that are appropriate for their current level of function. Patients will understand the common causes for  poor balance, possible solutions to these problems, and ways to modify their physical environment in order to minimize their fall risk. The purpose of this lesson is to teach patients about the importance of maintaining balance as they age and ways to minimize their risk of falling.  WORKSHOPS   Nutrition:  Fueling a Ship broker led group instruction and group discussion with PowerPoint presentation and patient guidebook. To enhance the learning environment the use of posters, models and videos may be added. Patients will review the foundational principles of the Pritikin Eating Plan and understand what constitutes a serving size in each of the food groups. Patients will also learn Pritikin-friendly foods that are better choices when away from home and review make-ahead meal and snack options.  Calorie density will be reviewed and applied to three nutrition priorities: weight maintenance, weight loss, and weight gain. The purpose of this lesson is to reinforce (in a group setting) the key concepts around what patients are recommended to eat and how to apply these guidelines when away from home by planning and selecting Pritikin-friendly options. Patients will understand how calorie density may be adjusted for different weight management goals.  Mindful Eating  Clinical staff led group instruction and group discussion with PowerPoint presentation and patient guidebook. To enhance the learning environment the use of posters, models and videos may be added. Patients will briefly review the concepts of the Pritikin Eating Plan and the importance of low-calorie dense foods. The concept of mindful eating will be introduced as well as the importance of paying attention to internal hunger signals. Triggers for non-hunger eating and techniques for dealing with triggers will be explored. The purpose of this lesson is to provide patients with the opportunity to review the basic principles of the  Pritikin Eating Plan, discuss the value of eating mindfully and how to measure internal cues of hunger and fullness using the Hunger Scale. Patients will also discuss reasons for non-hunger eating and learn strategies to use for controlling emotional eating.  Targeting Your Nutrition Priorities Clinical staff led group instruction and group discussion with PowerPoint presentation and patient guidebook. To enhance the learning environment the use of posters, models and videos may be added. Patients will learn how to determine their genetic susceptibility to disease by reviewing their family history. Patients will gain insight into the importance of diet as part of an overall healthy lifestyle in mitigating the impact of genetics and other environmental insults. The purpose of this lesson is to provide patients with the opportunity to assess their personal nutrition priorities by looking at their family history, their own health history and current risk factors. Patients will also be able to discuss ways of prioritizing and modifying the Pritikin Eating Plan for their highest risk areas  Menu  Clinical staff led group instruction and group discussion with PowerPoint presentation and patient guidebook. To enhance the learning environment the use of posters, models and videos may be added. Using menus brought in from [employer], or printed from [employer], patients will apply the Pritikin dining out guidelines that were presented in the [employer] video. Patients will also be able to practice these guidelines in a variety of provided scenarios. The purpose of this lesson is to provide patients with the opportunity to practice hands-on learning of the Pritikin Dining Out guidelines with actual menus and practice scenarios.  Label Reading Clinical staff led group instruction and group discussion with PowerPoint presentation and patient guidebook. To enhance the learning environment  the use of posters, models and videos may be added. Patients will review and discuss the Pritikin label reading guidelines presented in Pritikin's Label Reading Educational series video. Using fool labels brought in from local grocery stores and markets, patients will apply the label reading guidelines and determine if the packaged food meet the Pritikin guidelines. The purpose of this lesson is to provide patients with the opportunity to review, discuss, and practice hands-on learning of the Pritikin Label Reading guidelines with actual packaged food labels. Cooking School  Pritikin's [employer] are designed to teach patients ways to prepare quick, simple, and affordable recipes at home. The importance of nutrition's role in chronic disease risk reduction is reflected in its emphasis in the overall Pritikin program. By  learning how to prepare essential core Pritikin Eating Plan recipes, patients will increase control over what they eat; be able to customize the flavor of foods without the use of added salt, sugar, or fat; and improve the quality of the food they consume. By learning a set of core recipes which are easily assembled, quickly prepared, and affordable, patients are more likely to prepare more healthy foods at home. These workshops focus on convenient breakfasts, simple entres, side dishes, and desserts which can be prepared with minimal effort and are consistent with nutrition recommendations for cardiovascular risk reduction. Cooking [employer] are taught by a Armed forces logistics/support/administrative officer (RD) who has been trained by the [employer]. The chef or RD has a clear understanding of the importance of minimizing - if not completely eliminating - added fat, sugar, and sodium in recipes. Throughout the series of Cooking School Workshop sessions, patients will learn about healthy ingredients and efficient methods of cooking to build confidence in their capability to  prepare    Cooking School weekly topics:  Adding Flavor- Sodium-Free  Fast and Healthy Breakfasts  Powerhouse Plant-Based Proteins  Satisfying Salads and Dressings  Simple Sides and Sauces  International Cuisine-Spotlight on the [employer] Zones  Delicious Desserts  Savory Soups  Efficiency Cooking - Meals in a Snap  Tasty Appetizers and Snacks  Comforting Weekend Breakfasts  One-Pot Wonders   Fast Evening Meals  Easy Entertaining  Personalizing Your Pritikin Plate  WORKSHOPS   Healthy Mindset (Psychosocial): New Thoughts, New Behaviors Clinical staff led group instruction and group discussion with PowerPoint presentation and patient guidebook. To enhance the learning environment the use of posters, models and videos may be added. Patients will learn and practice techniques for developing effective health and lifestyle goals. Patients will be able to effectively apply the goal setting process learned to develop at least one new personal goal.  The purpose of this lesson is to expose patients to a new skill set of behavior modification techniques such as techniques setting SMART goals, overcoming barriers, and achieving new thoughts and new behaviors.  Managing Moods and Relationships Clinical staff led group instruction and group discussion with PowerPoint presentation and patient guidebook. To enhance the learning environment the use of posters, models and videos may be added. Patients will learn how emotional and chronic stress factors can impact their health and relationships. They will learn healthy ways to manage their moods and utilize positive coping mechanisms. In addition, ICR patients will learn ways to improve communication skills. The purpose of this lesson is to expose patients to ways of understanding how one's mood and health are intimately connected. Developing a healthy outlook can help build positive relationships and connections with others. Patients will understand the  importance of utilizing effective communication skills that include actively listening and being heard. They will learn and understand the importance of the "4 Cs" and especially Connections in fostering of a Healthy Mind-Set.  Healthy Sleep for a Healthy Heart Clinical staff led group instruction and group discussion with PowerPoint presentation and patient guidebook. To enhance the learning environment the use of posters, models and videos may be added. At the conclusion of this workshop, patients will be able to demonstrate knowledge of the importance of sleep to overall health, well-being, and quality of life. They will understand the symptoms of, and treatments for, common sleep disorders. Patients will also be able to identify daytime and nighttime behaviors which impact sleep, and they will be able to apply these  tools to help manage sleep-related challenges. The purpose of this lesson is to provide patients with a general overview of sleep and outline the importance of quality sleep. Patients will learn about a few of the most common sleep disorders. Patients will also be introduced to the concept of "sleep hygiene," and discover ways to self-manage certain sleeping problems through simple daily behavior changes. Finally, the workshop will motivate patients by clarifying the links between quality sleep and their goals of heart-healthy living.   Recognizing and Reducing Stress Clinical staff led group instruction and group discussion with PowerPoint presentation and patient guidebook. To enhance the learning environment the use of posters, models and videos may be added. At the conclusion of this workshop, patients will be able to understand the types of stress reactions, differentiate between acute and chronic stress, and recognize the impact that chronic stress has on their health. They will also be able to apply different coping mechanisms, such as reframing negative self-talk. Patients will have the  opportunity to practice a variety of stress management techniques, such as deep abdominal breathing, progressive muscle relaxation, and/or guided imagery.  The purpose of this lesson is to educate patients on the role of stress in their lives and to provide healthy techniques for coping with it.  Learning Barriers/Preferences:  Learning Barriers/Preferences - 01/05/22 1458       Learning Barriers/Preferences   Learning Barriers Sight   wears glasses   Learning Preferences Computer/Internet;Audio;Group Instruction;Individual Instruction;Pictoral;Skilled Demonstration;Verbal Instruction;Video;Written Material             Education Topics:  Knowledge Questionnaire Score:  Knowledge Questionnaire Score - 01/05/22 1458       Knowledge Questionnaire Score   Pre Score 20/24             Core Components/Risk Factors/Patient Goals at Admission:  Personal Goals and Risk Factors at Admission - 01/05/22 1458       Core Components/Risk Factors/Patient Goals on Admission    Weight Management Yes    Intervention Weight Management: Develop a combined nutrition and exercise program designed to reach desired caloric intake, while maintaining appropriate intake of nutrient and fiber, sodium and fats, and appropriate energy expenditure required for the weight goal.;Weight Management: Provide education and appropriate resources to help participant work on and attain dietary goals.    Expected Outcomes Short Term: Continue to assess and modify interventions until short term weight is achieved;Long Term: Adherence to nutrition and physical activity/exercise program aimed toward attainment of established weight goal;Understanding recommendations for meals to include 15-35% energy as protein, 25-35% energy from fat, 35-60% energy from carbohydrates, less than 200mg  of dietary cholesterol, 20-35 gm of total fiber daily;Understanding of distribution of calorie intake throughout the day with the consumption  of 4-5 meals/snacks    Heart Failure Yes    Intervention Provide a combined exercise and nutrition program that is supplemented with education, support and counseling about heart failure. Directed toward relieving symptoms such as shortness of breath, decreased exercise tolerance, and extremity edema.    Expected Outcomes Improve functional capacity of life;Short term: Attendance in program 2-3 days a week with increased exercise capacity. Reported lower sodium intake. Reported increased fruit and vegetable intake. Reports medication compliance.;Short term: Daily weights obtained and reported for increase. Utilizing diuretic protocols set by physician.;Long term: Adoption of self-care skills and reduction of barriers for early signs and symptoms recognition and intervention leading to self-care maintenance.    Hypertension Yes    Intervention Provide education on lifestyle modifcations  including regular physical activity/exercise, weight management, moderate sodium restriction and increased consumption of fresh fruit, vegetables, and low fat dairy, alcohol moderation, and smoking cessation.;Monitor prescription use compliance.    Expected Outcomes Short Term: Continued assessment and intervention until BP is < 140/77mm HG in hypertensive participants. < 130/86mm HG in hypertensive participants with diabetes, heart failure or chronic kidney disease.;Long Term: Maintenance of blood pressure at goal levels.    Lipids Yes    Intervention Provide education and support for participant on nutrition & aerobic/resistive exercise along with prescribed medications to achieve LDL 70mg , HDL >40mg .    Expected Outcomes Short Term: Participant states understanding of desired cholesterol values and is compliant with medications prescribed. Participant is following exercise prescription and nutrition guidelines.;Long Term: Cholesterol controlled with medications as prescribed, with individualized exercise RX and with  personalized nutrition plan. Value goals: LDL < 70mg , HDL > 40 mg.             Core Components/Risk Factors/Patient Goals Review:    Core Components/Risk Factors/Patient Goals at Discharge (Final Review):    ITP Comments:  ITP Comments     Row Name 01/05/22 1536 01/17/22 0857         ITP Comments Dr 03/18/22 MD, Medical Director, Introduction to [facility]/ Intensive cardiac rehab. Inital orientatin packet reviewed with the patient 30 Day ITP Review. Ayaansh attended cardiac rehab on 01/05/22 and is scheduled to begin intensive cardiac rehab on 01/27/22.               Comments: See ITP comments.03/28/22 RN BSN

## 2022-01-18 ENCOUNTER — Encounter (HOSPITAL_COMMUNITY): Payer: BC Managed Care – PPO

## 2022-01-20 ENCOUNTER — Encounter (HOSPITAL_COMMUNITY): Payer: BC Managed Care – PPO

## 2022-01-23 ENCOUNTER — Encounter (HOSPITAL_COMMUNITY): Payer: BC Managed Care – PPO

## 2022-01-25 ENCOUNTER — Encounter (HOSPITAL_COMMUNITY): Payer: BC Managed Care – PPO

## 2022-01-27 ENCOUNTER — Encounter (HOSPITAL_COMMUNITY)
Admission: RE | Admit: 2022-01-27 | Discharge: 2022-01-27 | Disposition: A | Discharge status: Home / Self Care | Payer: BC Managed Care – PPO | Source: Ambulatory Visit | Attending: Internal Medicine | Admitting: Internal Medicine

## 2022-01-27 ENCOUNTER — Ambulatory Visit: Payer: BC Managed Care – PPO | Attending: Physician Assistant

## 2022-01-27 ENCOUNTER — Encounter: Payer: BC Managed Care – PPO | Admitting: Internal Medicine

## 2022-01-27 ENCOUNTER — Encounter (HOSPITAL_COMMUNITY): Payer: BC Managed Care – PPO

## 2022-01-27 ENCOUNTER — Encounter: Payer: Self-pay | Admitting: Internal Medicine

## 2022-01-27 VITALS — BP 110/68 | HR 67 | Ht 77.0 in | Wt 220.0 lb

## 2022-01-27 DIAGNOSIS — I5022 Chronic systolic (congestive) heart failure: Secondary | ICD-10-CM | POA: Diagnosis not present

## 2022-01-27 DIAGNOSIS — I10 Essential (primary) hypertension: Secondary | ICD-10-CM

## 2022-01-27 DIAGNOSIS — Z7901 Long term (current) use of anticoagulants: Secondary | ICD-10-CM | POA: Insufficient documentation

## 2022-01-27 DIAGNOSIS — I5020 Unspecified systolic (congestive) heart failure: Secondary | ICD-10-CM | POA: Diagnosis not present

## 2022-01-27 DIAGNOSIS — I110 Hypertensive heart disease with heart failure: Secondary | ICD-10-CM | POA: Insufficient documentation

## 2022-01-27 DIAGNOSIS — Z7902 Long term (current) use of antithrombotics/antiplatelets: Secondary | ICD-10-CM | POA: Insufficient documentation

## 2022-01-27 DIAGNOSIS — Z955 Presence of coronary angioplasty implant and graft: Secondary | ICD-10-CM | POA: Diagnosis not present

## 2022-01-27 DIAGNOSIS — D6859 Other primary thrombophilia: Secondary | ICD-10-CM | POA: Diagnosis not present

## 2022-01-27 DIAGNOSIS — E782 Mixed hyperlipidemia: Secondary | ICD-10-CM

## 2022-01-27 DIAGNOSIS — R112 Nausea with vomiting, unspecified: Secondary | ICD-10-CM | POA: Insufficient documentation

## 2022-01-27 DIAGNOSIS — I25118 Atherosclerotic heart disease of native coronary artery with other forms of angina pectoris: Secondary | ICD-10-CM | POA: Insufficient documentation

## 2022-01-27 DIAGNOSIS — D6869 Other thrombophilia: Secondary | ICD-10-CM

## 2022-01-27 DIAGNOSIS — R197 Diarrhea, unspecified: Secondary | ICD-10-CM | POA: Insufficient documentation

## 2022-01-27 DIAGNOSIS — I480 Paroxysmal atrial fibrillation: Secondary | ICD-10-CM | POA: Diagnosis not present

## 2022-01-27 DIAGNOSIS — E785 Hyperlipidemia, unspecified: Secondary | ICD-10-CM | POA: Diagnosis not present

## 2022-01-27 DIAGNOSIS — I252 Old myocardial infarction: Secondary | ICD-10-CM | POA: Diagnosis not present

## 2022-01-27 DIAGNOSIS — Z79899 Other long term (current) drug therapy: Secondary | ICD-10-CM

## 2022-01-27 DIAGNOSIS — I214 Non-ST elevation (NSTEMI) myocardial infarction: Secondary | ICD-10-CM

## 2022-01-27 NOTE — Progress Notes (Signed)
Cardiology Office Note:    Date:  01/27/2022   ID:  Alex Arias, DOB [birth year], MRN [id number]  PCP:  Barbie Banner, MD   [facility] Providers Cardiologist:  Christell Constant, MD     Referring MD: Barbie Banner, MD   CC: Transition to new cardiologist  History of Present Illness:    Alex Arias is a 67 y.o. male with a hx of CAD with mid-distal LAD PCI, HLD, HTN, HFrEF 40-45%, PAF on eliquis.  Former Dr. Katrinka Blazing patient, transitioning to new cardiologist 2024.  Patient notes that he is doing well.   Had done well since he left the door. Cardiac rehab is going well and and he is doing exercise at home.  No symptoms.   His anginal equivalent with sudden onset nausea and vomiting.  With diarrhea.  Felt like he was punched in the back. No chest pain .  No SOB/DOE and no PND/Orthopnea.  No weight gain or leg swelling.  Has had a history of palpitations since his teenage years.  No palpitations when in atrial fibrillation.     Past Medical History:  Diagnosis Date   Abdominal distension    Abdominal pain    Arrhythmia    Coronary artery disease involving native coronary artery of native heart with unstable angina pectoris (HCC) 10/30/2021   Cath-PCI 10/30/2021: 99% thrombotic SubTO of mLAD just after D2 -> DES PCI 3.0 x 16 Synergy XD => deployed to 3.0 mm.  2 big Diags, LCx-OM1 & Large Dominant RCA - rPDA & PAV-RPL1-3 free of disease. EF ~40-45%, LVEDP 21--25 mmHg mid-apical anterior & apical HK.   Cough    Family history of diabetes mellitus    Itching    NSTEMI (non-ST elevated myocardial infarction) (HCC) - + Troponin with Anterior & Inferior ST depressions => ongoing CP => Urgent Cath 10/30/2021   Cath with 99% subTO of LAD => DES PCI   Pleurisy    Presence of drug coated stent in LAD coronary artery 10/30/2021   mLAD 99% thrombotic lesion: DES PCI w/ Synergy XD 3 x 16 => deployed ~3-3.1 mm   Right inguinal hernia    Unspecified viral infection, in conditions  classified elsewhere and of unspecified site    viral syndrome - per notes from [facility] dated 01/03/11    Past Surgical History:  Procedure Laterality Date   BOWEL RESECTION N/A 04/02/2014   Procedure: SMALL BOWEL RESECTION;  Surgeon: Violeta Gelinas, MD;  Location: [facility] OR;  Service: General;  Laterality: N/A;   CARDIAC CATHETERIZATION     CORONARY/GRAFT ACUTE MI REVASCULARIZATION N/A 10/30/2021   Procedure: Coronary/Graft Acute MI Revascularization;  Surgeon: Lyn Records, MD;  Location: [facility] INVASIVE CV LAB;  Service: Cardiovascular;  Laterality: N/A;   INGUINAL HERNIA REPAIR  02/01/2011   Procedure: LAPAROSCOPIC INGUINAL HERNIA;  Surgeon: Almond Lint, MD;  Location: [facility] OR;  Service: General;  Laterality: Bilateral;  Laparoscopic inguinal repair with mesh - Bilateral   INGUINAL HERNIA REPAIR Right 04/02/2014   Procedure: HERNIA REPAIR INGUINAL INCARCERATED ;  Surgeon: Violeta Gelinas, MD;  Location: [facility] OR;  Service: General;  Laterality: Right;   LEFT HEART CATH AND CORONARY ANGIOGRAPHY N/A 10/30/2021   Procedure: LEFT HEART CATH AND CORONARY ANGIOGRAPHY;  Surgeon: Lyn Records, MD;  Location: [facility] INVASIVE CV LAB;  Service: Cardiovascular;  Laterality: N/A;   WISDOM TOOTH EXTRACTION      Current Medications: No outpatient medications have been marked as taking for the 01/27/22  encounter (Office Visit) with Christell Constant, MD.     Allergies:   Patient has no known allergies.   Social History   Socioeconomic History   Marital status: Married    Spouse name: Verlon Au   Number of children: 2   Years of education: Not on file   Highest education level: Master's degree (e.g., MA, MS, MEng, MEd, MSW, MBA)  Occupational History   Occupation: [employer]  Tobacco Use   Smoking status: Never   Smokeless tobacco: Never  Vaping Use   Vaping Use: Never used  Substance and Sexual Activity   Alcohol use: Yes    Comment: 1 - 3 wine or beer weekly   Drug use: No   Sexual  activity: Not on file  Other Topics Concern   Not on file  Social History Narrative   Not on file   Social Determinants of Health   Financial Resource Strain: Low Risk  (11/02/2021)   Overall Financial Resource Strain (CARDIA)    Difficulty of Paying Living Expenses: Not very hard  Food Insecurity: Not on file  Transportation Needs: No Transportation Needs (11/02/2021)   PRAPARE - Administrator, Civil Service (Medical): No    Lack of Transportation (Non-Medical): No  Physical Activity: Not on file  Stress: Not on file  Social Connections: Not on file     Family History: Father, and mother had stroke. History of coronary artery disease notable for grandfather  ROS:   Please see the history of present illness.     All other systems reviewed and are negative.  EKGs/Labs/Other Studies Reviewed:    The following studies were reviewed today:   Cardiac Studies & Procedures   CARDIAC CATHETERIZATION  CARDIAC CATHETERIZATION 10/30/2021  Narrative CONCLUSIONS: 99% stenosis of the mid LAD after the large second diagonal.  TIMI grade I flow was noted in the vessel.  The vessel was treated with angioplasty and stenting using a 3.0 x 16 Synergy reducing the stenosis to 0% with improvement in TIMI flow to 2.5.  Sluggish flow was related to distal embolization and was treated with IV Aggrastat, IC verapamil, and nitroglycerin intracoronary. Right dominant anatomy without significant obstruction. Left main is widely patent. Circumflex is small and widely patent. Mid to apical anterior wall severe hypokinesis.  EF 40 to 50%.  LVEDP greater than 22 mmHg.  RECOMMENDATIONS:  Preventive therapy with high intensity statin, beta-blocker, and ACE inhibitor therapy if tolerated. Dual antiplatelet therapy with aspirin and Brilinta.  Should continue high intensity antiplatelet treatment for at least 6 months and then perhaps Brilinta monotherapy or converting to clopidogrel  monotherapy could be considered. Patient will need a 2D Doppler echocardiogram.  Findings Coronary Findings Diagnostic  Dominance: Right  Left Anterior Descending Mid LAD to Dist LAD lesion is 99% stenosed.  Intervention  Mid LAD to Dist LAD lesion Stent A drug-eluting stent was successfully placed. Post-Intervention Lesion Assessment The intervention was successful. Pre-interventional TIMI flow is 1. Post-intervention TIMI flow is 3. No complications occurred at this lesion. There is a 0% residual stenosis post intervention.     ECHOCARDIOGRAM  ECHOCARDIOGRAM COMPLETE 12/28/2021  Narrative ECHOCARDIOGRAM REPORT    Patient Name:   BRYLEE MCGREAL   Date of Exam: 12/28/2021 Medical Rec #:  [id number]     Height:       77.0 in Accession #:    [id number]    Weight:       221.6 lb Date of Birth:  1955-01-28  BSA:          2.336 m Patient Age:    66 years      BP:           120/72 mmHg Patient Gender: M             HR:           58 bpm. Exam Location:  [address]  Procedure: 2D Echo, Cardiac Doppler, Color Doppler and Intracardiac Opacification Agent  Indications:    I25.10 Coronary artery disease.  History:        Patient has prior history of Echocardiogram examinations, most recent 11/01/2021. CAD; STENT. NSTEMI.  Sonographer:    Sedonia Small Rodgers-Jones RDCS Referring Phys: [id number] TESSA N CONTE  IMPRESSIONS   1. Left ventricular ejection fraction, by estimation, is 40 to 45%. The left ventricle has mildly decreased function. The left ventricle demonstrates regional wall motion abnormalities (see scoring diagram/findings for description). Left ventricular diastolic parameters are indeterminate. There is severe hypokinesis of the left ventricular, basal-mid inferoseptal wall. There is severe hypokinesis of the left ventricular, apical septal wall. There is akinesis of the left ventricular, apical segment and anterior wall. There is akinesis of the left ventricular, mid  anteroseptal wall. 2. Right ventricular systolic function is normal. The right ventricular size is normal. Tricuspid regurgitation signal is inadequate for assessing PA pressure. 3. The mitral valve is normal in structure. No evidence of mitral valve regurgitation. No evidence of mitral stenosis. 4. The aortic valve is normal in structure. Aortic valve regurgitation is trivial. No aortic stenosis is present. 5. Aortic dilatation noted. There is mild dilatation of the ascending aorta, measuring 38 mm. 6. The inferior vena cava is normal in size with greater than 50% respiratory variability, suggesting right atrial pressure of 3 mmHg.  FINDINGS Left Ventricle: Left ventricular ejection fraction, by estimation, is 40 to 45%. The left ventricle has mildly decreased function. The left ventricle demonstrates regional wall motion abnormalities. Severe hypokinesis of the left ventricular, basal-mid inferoseptal wall. Severe hypokinesis of the left ventricular, apical septal wall. Definity contrast agent was given IV to delineate the left ventricular endocardial borders. The left ventricular internal cavity size was normal in size. There is no left ventricular hypertrophy. Left ventricular diastolic parameters are indeterminate. Normal left ventricular filling pressure.  Right Ventricle: The right ventricular size is normal. No increase in right ventricular wall thickness. Right ventricular systolic function is normal. Tricuspid regurgitation signal is inadequate for assessing PA pressure.  Left Atrium: Left atrial size was normal in size.  Right Atrium: Right atrial size was normal in size.  Pericardium: There is no evidence of pericardial effusion.  Mitral Valve: The mitral valve is normal in structure. There is mild calcification of the anterior mitral valve leaflet(s). No evidence of mitral valve regurgitation. No evidence of mitral valve stenosis.  Tricuspid Valve: The tricuspid valve is normal in  structure. Tricuspid valve regurgitation is trivial. No evidence of tricuspid stenosis.  Aortic Valve: The aortic valve is normal in structure. Aortic valve regurgitation is trivial. No aortic stenosis is present.  Pulmonic Valve: The pulmonic valve was normal in structure. Pulmonic valve regurgitation is mild. No evidence of pulmonic stenosis.  Aorta: Aortic dilatation noted. There is mild dilatation of the ascending aorta, measuring 38 mm.  Venous: The inferior vena cava is normal in size with greater than 50% respiratory variability, suggesting right atrial pressure of 3 mmHg.  IAS/Shunts: No atrial level shunt detected by color flow Doppler.  LEFT VENTRICLE PLAX 2D LVOT diam:     2.20 cm   Diastology LV SV:         95        LV e' medial:    7.40 cm/s LV SV Index:   41        LV E/e' medial:  9.6 LVOT Area:     3.80 cm  LV e' lateral:   10.20 cm/s LV E/e' lateral: 7.0   RIGHT VENTRICLE          IVC RV Basal diam:  3.60 cm  IVC diam: 1.50 cm RV Mid diam:    3.20 cm  LEFT ATRIUM             Index        RIGHT ATRIUM           Index LA Vol (A2C):   84.9 ml 36.32 ml/m  RA Area:     14.90 cm LA Vol (A4C):   34.8 ml 14.89 ml/m  RA Volume:   40.30 ml  17.25 ml/m LA Biplane Vol: 54.9 ml 23.50 ml/m AORTIC VALVE LVOT Vmax:   109.50 cm/s LVOT Vmean:  68.050 cm/s LVOT VTI:    0.250 m  AORTA Ao Asc diam: 3.80 cm  MITRAL VALVE MV Area (PHT): 3.68 cm    SHUNTS MV Decel Time: 206 msec    Systemic VTI:  0.25 m MV E velocity: 71.35 cm/s  Systemic Diam: 2.20 cm MV A velocity: 62.30 cm/s MV E/A ratio:  1.15  Armanda Magic MD Electronically signed by Armanda Magic MD Signature Date/Time: 12/28/2021/4:10:24 PM    Final              Recent Labs: 10/30/2021: ALT 41 11/02/2021: BUN 12; Creatinine, Ser 1.15; Hemoglobin 13.8; Magnesium 2.0; Platelets 137; Potassium 3.3; Sodium 139  Recent Lipid Panel    Component Value Date/Time   CHOL 122 10/31/2021 0232   TRIG 106  10/31/2021 0232   HDL 44 10/31/2021 0232   CHOLHDL 2.8 10/31/2021 0232   VLDL 21 10/31/2021 0232   LDLCALC 57 10/31/2021 0232     Risk Assessment/Calculations:    CHA2DS2-VASc Score = 4   This indicates a 4.8% annual risk of stroke. The patient's score is based upon: CHF History: 1 HTN History: 1 Diabetes History: 0 Stroke History: 0 Vascular Disease History: 1 Age Score: 1 Gender Score: 0             Physical Exam:    VS:  BP 110/68   Pulse 67   Ht 6\' 5"  (1.956 m)   Wt 220 lb (99.8 kg)   SpO2 96%   BMI 26.09 kg/m     Wt Readings from Last 3 Encounters:  01/27/22 220 lb (99.8 kg)  01/05/22 224 lb 3.3 oz (101.7 kg)  11/09/21 221 lb 9.6 oz (100.5 kg)    GEN:  Well nourished, well developed in no acute distress HEENT: Normal NECK: No JVD CARDIAC: RRR, no murmurs, rubs, gallops RESPIRATORY:  Clear to auscultation without rales, wheezing or rhonchi  ABDOMEN: Soft, non-tender, non-distended MUSCULOSKELETAL:  No edema; No deformity  SKIN: Warm and dry NEUROLOGIC:  Alert and oriented x 3 PSYCHIATRIC:  Normal affect   ASSESSMENT:    1. HFrEF (heart failure with reduced ejection fraction) (HCC)   2. PAF (paroxysmal atrial fibrillation) (HCC)   3. Hyperlipidemia LDL goal <70   4. Mixed hyperlipidemia   5. Essential hypertension   6. Presence of  drug coated stent in LAD coronary artery   7. Acquired thrombophilia ([place])    PLAN:     Heart Failure Reduced Ejection Fraction  - chronic - NYHA class I, Stage C, euvolemic, etiology from ischemia - Diuretic regimen: None  - Discussed the importance of fluid restriction of < 2 L, salt restriction, and checking daily weights  -  Replace electrolytes PRN and keep K>4 and Mg>2. - BMP/BNP today after ARNI start  - Given BP will either increase MRA or start succinate based on this - ARNI at maximum tolerated dose - SGLT2i  - Supervised exercise therapy with cardiac rehab; we discussed weight lifting, cardiovascular  activity.   Coronary Artery Disease; Obstructive - asymptomatic on therapy - anatomy: mid-distal LAD - continue plavix and DOAC until 10/24 unless no AF - continue statin, goal LDL < 55, on PCSK9i as part of EVOLVE trial; lipid f/u - discussed cardiac rehab   Peri-STEMI Atrial Fibrillation,  - CHADSVASC=4. - Continue anticoagulation with eliquis; Acquired Thrombophilia - is off amiodarone and has heart monitor for after this wears off; we will discuss DOAC stop vs ILR  Three months me or Tessa      Time Spent Directly with Patient:   I have spent a total of 50 minutes with the patient reviewing notes, imaging, EKGs, labs and examining the patient as well as establishing an assessment and plan that was discussed personally with the patient.  > 50% of time was spent in direct patient care and family.     Medication Adjustments/Labs and Tests Ordered: Current medicines are reviewed at length with the patient today.  Concerns regarding medicines are outlined above.  Orders Placed This Encounter  Procedures   Pro b natriuretic peptide (BNP)   Basic metabolic panel   CBC   Lipid panel   No orders of the defined types were placed in this encounter.   Patient Instructions  Medication Instructions:  Your physician recommends that you continue on your current medications as directed. Please refer to the Current Medication list given to you today.  *If you need a refill on your cardiac medications before your next appointment, please call your pharmacy*   Lab Work: TODAY: BMP, BNP, CBC, FLP  If you have labs (blood work) drawn today and your tests are completely normal, you will receive your results only by: [place] (if you have MyChart) OR A paper copy in the mail If you have any lab test that is abnormal or we need to change your treatment, we will call you to review the results.   Testing/Procedures: NONE   Follow-Up: At [facility], you and your  health needs are our priority.  As part of our continuing mission to provide you with exceptional heart care, we have created designated Provider Care Teams.  These Care Teams include your primary Cardiologist (physician) and Advanced Practice Providers (APPs -  Physician Assistants and Nurse Practitioners) who all work together to provide you with the care you need, when you need it.  We recommend signing up for the patient portal called "MyChart".  Sign up information is provided on this After Visit Summary.  MyChart is used to connect with patients for Virtual Visits (Telemedicine).  Patients are able to view lab/test results, encounter notes, upcoming appointments, etc.  Non-urgent messages can be sent to your provider as well.   To learn more about what you can do with MyChart, go to [email or web address].    Your next appointment:  3 month(s)  Provider:   Werner Lean, MD  or Nicholes Rough, PA-C         Signed, Werner Lean, MD  01/27/2022 5:32 PM    [place]

## 2022-01-27 NOTE — Patient Instructions (Signed)
Medication Instructions:  Your physician recommends that you continue on your current medications as directed. Please refer to the Current Medication list given to you today.  *If you need a refill on your cardiac medications before your next appointment, please call your pharmacy*   Lab Work: TODAY: BMP, BNP, CBC, FLP  If you have labs (blood work) drawn today and your tests are completely normal, you will receive your results only by: [place] (if you have MyChart) OR A paper copy in the mail If you have any lab test that is abnormal or we need to change your treatment, we will call you to review the results.   Testing/Procedures: NONE   Follow-Up: At [facility], you and your health needs are our priority.  As part of our continuing mission to provide you with exceptional heart care, we have created designated Provider Care Teams.  These Care Teams include your primary Cardiologist (physician) and Advanced Practice Providers (APPs -  Physician Assistants and Nurse Practitioners) who all work together to provide you with the care you need, when you need it.  We recommend signing up for the patient portal called "MyChart".  Sign up information is provided on this After Visit Summary.  MyChart is used to connect with patients for Virtual Visits (Telemedicine).  Patients are able to view lab/test results, encounter notes, upcoming appointments, etc.  Non-urgent messages can be sent to your provider as well.   To learn more about what you can do with MyChart, go to [email or web address].    Your next appointment:   3 month(s)  Provider:   Werner Lean, MD  or Nicholes Rough, PA-C

## 2022-01-27 NOTE — Progress Notes (Signed)
Daily Session Note  Patient Details  Name: Alex Arias MRN: [id number] Date of Birth: [birth year] Referring Provider:   Flowsheet Row INTENSIVE CARDIAC REHAB ORIENT from 01/05/2022 in [facility] [place]  Referring Provider Glenetta Hew, MD       Encounter Date: 01/27/2022  Check In:  Session Check In - 01/27/22 1400       Check-In   Supervising physician immediately available to respond to emergencies [facility] - Physician supervision    Physician(s) Christen Bame, NP    Location [facility]    Staff Present Sandy Salaam, MS, Exercise Physiologist;Laytoya Ion, RN, BSN;Jetta Walker BS, ACSM-CEP, Exercise Physiologist;Olinty Celesta Aver, MS, ACSM-CEP, Exercise Physiologist;David Makemson, MS, ACSM-CEP, CCRP, Exercise Physiologist    Virtual Visit No    Medication changes reported     No    Fall or balance concerns reported    No    Tobacco Cessation No Change    Warm-up and Cool-down Performed as group-led instruction    Resistance Training Performed Yes    VAD Patient? No    PAD/SET Patient? No      Pain Assessment   Currently in Pain? No/denies    Pain Score 0-No pain    Multiple Pain Sites No             Capillary Blood Glucose: No results found for this or any previous visit (from the past 24 hour(s)).   Exercise Prescription Changes - 01/27/22 1613       Response to Exercise   Blood Pressure (Admit) 112/68    Blood Pressure (Exercise) 128/78    Blood Pressure (Exit) 118/70    Heart Rate (Admit) 56 bpm    Heart Rate (Exercise) 112 bpm    Heart Rate (Exit) 65 bpm    Rating of Perceived Exertion (Exercise) 11    Symptoms none    Comments Pt first day in the CRP2 program    Duration Progress to 30 minutes of  aerobic without signs/symptoms of physical distress    Intensity THRR unchanged      Progression   Progression Continue to progress workloads to maintain intensity without signs/symptoms  of physical distress.    Average METs 3.55      Resistance Training   Training Prescription Yes    Weight 4 lbs wts    Reps 10-15    Time 10 Minutes      NuStep   Level 4    SPM 85    Minutes 15    METs 2.6      Elliptical   Level 1    Minutes 15    METs 4.5             Social History   Tobacco Use  Smoking Status Never  Smokeless Tobacco Never    Goals Met:  Exercise tolerated well No report of concerns or symptoms today Strength training completed today  Goals Unmet:  Not Applicable  Comments: Pt started cardiac rehab today.  Pt tolerated light exercise without difficulty. VSS, telemetry-Sinus Rhythm, asymptomatic.  Medication list reconciled. Pt denies barriers to medicaiton compliance.  PSYCHOSOCIAL ASSESSMENT:  PHQ-0. Pt exhibits positive coping skills, hopeful outlook with supportive family. No psychosocial needs identified at this time, no psychosocial interventions necessary.    Pt enjoys golf and basketball.   Pt oriented to exercise equipment and routine.    Understanding verbalized.Harrell Gave RN BSN    Dr. Fransico Him  is Medical Director for Cardiac Rehab at [facility]. 

## 2022-01-28 LAB — CBC
Hematocrit: 49.9 % (ref 37.5–51.0)
Hemoglobin: 17.1 g/dL (ref 13.0–17.7)
MCH: 30.9 pg (ref 26.6–33.0)
MCHC: 34.3 g/dL (ref 31.5–35.7)
MCV: 90 fL (ref 79–97)
Platelets: 203 10*3/uL (ref 150–450)
RBC: 5.53 x10E6/uL (ref 4.14–5.80)
RDW: 13.3 % (ref 11.6–15.4)
WBC: 7.1 10*3/uL (ref 3.4–10.8)

## 2022-01-28 LAB — BASIC METABOLIC PANEL
BUN/Creatinine Ratio: 11 (ref 10–24)
BUN: 14 mg/dL (ref 8–27)
CO2: 23 mmol/L (ref 20–29)
Calcium: 10.1 mg/dL (ref 8.6–10.2)
Chloride: 103 mmol/L (ref 96–106)
Creatinine, Ser: 1.22 mg/dL (ref 0.76–1.27)
Glucose: 89 mg/dL (ref 70–99)
Potassium: 4.8 mmol/L (ref 3.5–5.2)
Sodium: 145 mmol/L — ABNORMAL HIGH (ref 134–144)
eGFR: 65 mL/min/{1.73_m2} (ref >59)

## 2022-01-28 LAB — LIPID PANEL
Chol/HDL Ratio: 1.6 ratio (ref 0.0–5.0)
Cholesterol, Total: 87 mg/dL — ABNORMAL LOW (ref 100–199)
HDL: 53 mg/dL (ref >39)
LDL Chol Calc (NIH): 17 mg/dL (ref 0–99)
Triglycerides: 83 mg/dL (ref 0–149)
VLDL Cholesterol Cal: 17 mg/dL (ref 5–40)

## 2022-01-28 LAB — PRO B NATRIURETIC PEPTIDE: NT-Pro BNP: 217 pg/mL (ref 0–376)

## 2022-01-29 DIAGNOSIS — G4733 Obstructive sleep apnea (adult) (pediatric): Secondary | ICD-10-CM | POA: Diagnosis not present

## 2022-01-30 ENCOUNTER — Encounter (HOSPITAL_COMMUNITY): Payer: BC Managed Care – PPO

## 2022-01-30 ENCOUNTER — Encounter (HOSPITAL_COMMUNITY)
Admission: RE | Admit: 2022-01-30 | Discharge: 2022-01-30 | Disposition: A | Discharge status: Home / Self Care | Payer: BC Managed Care – PPO | Source: Ambulatory Visit | Attending: Internal Medicine | Admitting: Internal Medicine

## 2022-01-30 ENCOUNTER — Encounter: Payer: Self-pay | Admitting: Internal Medicine

## 2022-01-30 DIAGNOSIS — I5022 Chronic systolic (congestive) heart failure: Secondary | ICD-10-CM | POA: Diagnosis not present

## 2022-01-30 DIAGNOSIS — I25118 Atherosclerotic heart disease of native coronary artery with other forms of angina pectoris: Secondary | ICD-10-CM | POA: Diagnosis not present

## 2022-01-30 DIAGNOSIS — I214 Non-ST elevation (NSTEMI) myocardial infarction: Secondary | ICD-10-CM

## 2022-01-30 DIAGNOSIS — Z7902 Long term (current) use of antithrombotics/antiplatelets: Secondary | ICD-10-CM | POA: Diagnosis not present

## 2022-01-30 DIAGNOSIS — D6859 Other primary thrombophilia: Secondary | ICD-10-CM | POA: Diagnosis not present

## 2022-01-30 DIAGNOSIS — R112 Nausea with vomiting, unspecified: Secondary | ICD-10-CM | POA: Diagnosis not present

## 2022-01-30 DIAGNOSIS — I5020 Unspecified systolic (congestive) heart failure: Secondary | ICD-10-CM

## 2022-01-30 DIAGNOSIS — Z7901 Long term (current) use of anticoagulants: Secondary | ICD-10-CM | POA: Diagnosis not present

## 2022-01-30 DIAGNOSIS — Z955 Presence of coronary angioplasty implant and graft: Secondary | ICD-10-CM | POA: Diagnosis not present

## 2022-01-30 DIAGNOSIS — E782 Mixed hyperlipidemia: Secondary | ICD-10-CM | POA: Diagnosis not present

## 2022-01-30 DIAGNOSIS — I252 Old myocardial infarction: Secondary | ICD-10-CM | POA: Diagnosis not present

## 2022-01-30 DIAGNOSIS — R197 Diarrhea, unspecified: Secondary | ICD-10-CM | POA: Diagnosis not present

## 2022-01-30 DIAGNOSIS — I480 Paroxysmal atrial fibrillation: Secondary | ICD-10-CM | POA: Diagnosis not present

## 2022-01-30 DIAGNOSIS — I110 Hypertensive heart disease with heart failure: Secondary | ICD-10-CM | POA: Diagnosis not present

## 2022-01-30 MED ORDER — SPIRONOLACTONE 25 MG PO TABS: 25.0000 mg | ORAL_TABLET | Freq: Every day | ORAL | 3 refills | Status: DC

## 2022-02-01 ENCOUNTER — Encounter (HOSPITAL_COMMUNITY)
Admission: RE | Admit: 2022-02-01 | Discharge: 2022-02-01 | Disposition: A | Discharge status: Home / Self Care | Payer: BC Managed Care – PPO | Source: Ambulatory Visit | Attending: Internal Medicine | Admitting: Internal Medicine

## 2022-02-01 ENCOUNTER — Encounter (HOSPITAL_COMMUNITY): Payer: BC Managed Care – PPO

## 2022-02-01 DIAGNOSIS — Z955 Presence of coronary angioplasty implant and graft: Secondary | ICD-10-CM

## 2022-02-01 DIAGNOSIS — I214 Non-ST elevation (NSTEMI) myocardial infarction: Secondary | ICD-10-CM

## 2022-02-01 DIAGNOSIS — Z7902 Long term (current) use of antithrombotics/antiplatelets: Secondary | ICD-10-CM | POA: Diagnosis not present

## 2022-02-01 DIAGNOSIS — I110 Hypertensive heart disease with heart failure: Secondary | ICD-10-CM | POA: Diagnosis not present

## 2022-02-01 DIAGNOSIS — R197 Diarrhea, unspecified: Secondary | ICD-10-CM | POA: Diagnosis not present

## 2022-02-01 DIAGNOSIS — R112 Nausea with vomiting, unspecified: Secondary | ICD-10-CM | POA: Diagnosis not present

## 2022-02-01 DIAGNOSIS — Z7901 Long term (current) use of anticoagulants: Secondary | ICD-10-CM | POA: Diagnosis not present

## 2022-02-01 DIAGNOSIS — I252 Old myocardial infarction: Secondary | ICD-10-CM | POA: Diagnosis not present

## 2022-02-01 DIAGNOSIS — E782 Mixed hyperlipidemia: Secondary | ICD-10-CM | POA: Diagnosis not present

## 2022-02-01 DIAGNOSIS — D6859 Other primary thrombophilia: Secondary | ICD-10-CM | POA: Diagnosis not present

## 2022-02-01 DIAGNOSIS — I5022 Chronic systolic (congestive) heart failure: Secondary | ICD-10-CM | POA: Diagnosis not present

## 2022-02-01 DIAGNOSIS — I25118 Atherosclerotic heart disease of native coronary artery with other forms of angina pectoris: Secondary | ICD-10-CM | POA: Diagnosis not present

## 2022-02-01 DIAGNOSIS — I480 Paroxysmal atrial fibrillation: Secondary | ICD-10-CM | POA: Diagnosis not present

## 2022-02-03 ENCOUNTER — Encounter (HOSPITAL_COMMUNITY): Payer: BC Managed Care – PPO

## 2022-02-03 DIAGNOSIS — I252 Old myocardial infarction: Secondary | ICD-10-CM | POA: Diagnosis not present

## 2022-02-03 DIAGNOSIS — R9431 Abnormal electrocardiogram [ECG] [EKG]: Secondary | ICD-10-CM | POA: Diagnosis not present

## 2022-02-03 DIAGNOSIS — I25119 Atherosclerotic heart disease of native coronary artery with unspecified angina pectoris: Secondary | ICD-10-CM | POA: Diagnosis not present

## 2022-02-03 DIAGNOSIS — R0602 Shortness of breath: Secondary | ICD-10-CM | POA: Diagnosis not present

## 2022-02-03 DIAGNOSIS — Z79899 Other long term (current) drug therapy: Secondary | ICD-10-CM | POA: Diagnosis not present

## 2022-02-03 DIAGNOSIS — I480 Paroxysmal atrial fibrillation: Secondary | ICD-10-CM | POA: Diagnosis not present

## 2022-02-03 DIAGNOSIS — I5022 Chronic systolic (congestive) heart failure: Secondary | ICD-10-CM | POA: Diagnosis not present

## 2022-02-06 ENCOUNTER — Encounter (HOSPITAL_COMMUNITY)
Admission: RE | Admit: 2022-02-06 | Discharge: 2022-02-06 | Disposition: A | Discharge status: Home / Self Care | Payer: BC Managed Care – PPO | Source: Ambulatory Visit | Attending: Internal Medicine | Admitting: Internal Medicine

## 2022-02-06 ENCOUNTER — Encounter (HOSPITAL_COMMUNITY): Payer: BC Managed Care – PPO

## 2022-02-06 DIAGNOSIS — R112 Nausea with vomiting, unspecified: Secondary | ICD-10-CM | POA: Diagnosis not present

## 2022-02-06 DIAGNOSIS — E782 Mixed hyperlipidemia: Secondary | ICD-10-CM | POA: Diagnosis not present

## 2022-02-06 DIAGNOSIS — Z955 Presence of coronary angioplasty implant and graft: Secondary | ICD-10-CM

## 2022-02-06 DIAGNOSIS — I214 Non-ST elevation (NSTEMI) myocardial infarction: Secondary | ICD-10-CM

## 2022-02-06 DIAGNOSIS — Z7902 Long term (current) use of antithrombotics/antiplatelets: Secondary | ICD-10-CM | POA: Diagnosis not present

## 2022-02-06 DIAGNOSIS — I5022 Chronic systolic (congestive) heart failure: Secondary | ICD-10-CM | POA: Diagnosis not present

## 2022-02-06 DIAGNOSIS — I25118 Atherosclerotic heart disease of native coronary artery with other forms of angina pectoris: Secondary | ICD-10-CM | POA: Diagnosis not present

## 2022-02-06 DIAGNOSIS — I480 Paroxysmal atrial fibrillation: Secondary | ICD-10-CM | POA: Diagnosis not present

## 2022-02-06 DIAGNOSIS — R197 Diarrhea, unspecified: Secondary | ICD-10-CM | POA: Diagnosis not present

## 2022-02-06 DIAGNOSIS — Z7901 Long term (current) use of anticoagulants: Secondary | ICD-10-CM | POA: Diagnosis not present

## 2022-02-06 DIAGNOSIS — D6859 Other primary thrombophilia: Secondary | ICD-10-CM | POA: Diagnosis not present

## 2022-02-06 DIAGNOSIS — I110 Hypertensive heart disease with heart failure: Secondary | ICD-10-CM | POA: Diagnosis not present

## 2022-02-06 DIAGNOSIS — I252 Old myocardial infarction: Secondary | ICD-10-CM | POA: Diagnosis not present

## 2022-02-07 ENCOUNTER — Encounter: Payer: Self-pay | Admitting: *Deleted

## 2022-02-07 DIAGNOSIS — Z006 Encounter for examination for normal comparison and control in clinical research program: Secondary | ICD-10-CM

## 2022-02-07 NOTE — Research (Signed)
Follow-Up Visit Completed*   []  Not Necessary, No Potential Adverse Events Or Medication Issues Reported On Completed Subject Questionnaire   [x]  Yes, Contact With Subject/Alternate Contact Completed   []  Yes, No Contact With Subject/Alternate Contact Completed, But Electronic Health Record Was Reviewed   []  No, Unable To Contact Subject/Alternate Contact   Have you reviewed Ongoing medications on the Targeted Concomitant Medication form and updated the form as needed?   [x]  Yes   []  No   Subject Status*   [x]  Continuing In Follow-up   []  At Risk For Lost To Follow-up   []  Withdrawal From All Future Study Activities Including Passive Follow-up By [place] Record Review Or Contact With Healthcare Provider Or Family Member/Friend   []  Death   Vital Status*   [x]  Alive   []  Deceased   []  Unknown   Last Known To Be Alive Source*   []  Subject Completed Follow-up Questionnaire/Seen In Person/Via Telephone Contact   []  Family Member or Caretaker   []  Primary Physician Or Medical Records   []  Publicly Available Source   []  Other  Date of last dose taken   07-Feb-2022  Over the last 12 weeks did the subject miss any doses? No  Over the last 12 weeks did the subject restart Evolocumab after an interruption?No

## 2022-02-08 ENCOUNTER — Ambulatory Visit: Payer: BC Managed Care – PPO | Attending: Family Medicine

## 2022-02-08 ENCOUNTER — Encounter (HOSPITAL_COMMUNITY): Payer: BC Managed Care – PPO

## 2022-02-08 ENCOUNTER — Encounter (HOSPITAL_COMMUNITY)
Admission: RE | Admit: 2022-02-08 | Discharge: 2022-02-08 | Disposition: A | Discharge status: Home / Self Care | Payer: BC Managed Care – PPO | Source: Ambulatory Visit | Attending: Internal Medicine | Admitting: Internal Medicine

## 2022-02-08 DIAGNOSIS — I5022 Chronic systolic (congestive) heart failure: Secondary | ICD-10-CM | POA: Diagnosis not present

## 2022-02-08 DIAGNOSIS — D6859 Other primary thrombophilia: Secondary | ICD-10-CM | POA: Diagnosis not present

## 2022-02-08 DIAGNOSIS — I25118 Atherosclerotic heart disease of native coronary artery with other forms of angina pectoris: Secondary | ICD-10-CM | POA: Diagnosis not present

## 2022-02-08 DIAGNOSIS — Z7901 Long term (current) use of anticoagulants: Secondary | ICD-10-CM | POA: Diagnosis not present

## 2022-02-08 DIAGNOSIS — R112 Nausea with vomiting, unspecified: Secondary | ICD-10-CM | POA: Diagnosis not present

## 2022-02-08 DIAGNOSIS — I110 Hypertensive heart disease with heart failure: Secondary | ICD-10-CM | POA: Diagnosis not present

## 2022-02-08 DIAGNOSIS — I480 Paroxysmal atrial fibrillation: Secondary | ICD-10-CM | POA: Diagnosis not present

## 2022-02-08 DIAGNOSIS — I214 Non-ST elevation (NSTEMI) myocardial infarction: Secondary | ICD-10-CM

## 2022-02-08 DIAGNOSIS — R197 Diarrhea, unspecified: Secondary | ICD-10-CM | POA: Diagnosis not present

## 2022-02-08 DIAGNOSIS — Z955 Presence of coronary angioplasty implant and graft: Secondary | ICD-10-CM | POA: Diagnosis not present

## 2022-02-08 DIAGNOSIS — I252 Old myocardial infarction: Secondary | ICD-10-CM | POA: Diagnosis not present

## 2022-02-08 DIAGNOSIS — E782 Mixed hyperlipidemia: Secondary | ICD-10-CM | POA: Diagnosis not present

## 2022-02-08 DIAGNOSIS — Z7902 Long term (current) use of antithrombotics/antiplatelets: Secondary | ICD-10-CM | POA: Diagnosis not present

## 2022-02-10 ENCOUNTER — Encounter (HOSPITAL_COMMUNITY): Payer: BC Managed Care – PPO

## 2022-02-13 ENCOUNTER — Encounter (HOSPITAL_COMMUNITY): Payer: BC Managed Care – PPO

## 2022-02-13 ENCOUNTER — Encounter (HOSPITAL_COMMUNITY)
Admission: RE | Admit: 2022-02-13 | Discharge: 2022-02-13 | Disposition: A | Discharge status: Home / Self Care | Payer: BC Managed Care – PPO | Source: Ambulatory Visit | Attending: Internal Medicine | Admitting: Internal Medicine

## 2022-02-13 DIAGNOSIS — D6859 Other primary thrombophilia: Secondary | ICD-10-CM | POA: Diagnosis not present

## 2022-02-13 DIAGNOSIS — Z7902 Long term (current) use of antithrombotics/antiplatelets: Secondary | ICD-10-CM | POA: Diagnosis not present

## 2022-02-13 DIAGNOSIS — I252 Old myocardial infarction: Secondary | ICD-10-CM | POA: Diagnosis not present

## 2022-02-13 DIAGNOSIS — I110 Hypertensive heart disease with heart failure: Secondary | ICD-10-CM | POA: Diagnosis not present

## 2022-02-13 DIAGNOSIS — Z955 Presence of coronary angioplasty implant and graft: Secondary | ICD-10-CM | POA: Diagnosis not present

## 2022-02-13 DIAGNOSIS — I214 Non-ST elevation (NSTEMI) myocardial infarction: Secondary | ICD-10-CM

## 2022-02-13 DIAGNOSIS — Z7901 Long term (current) use of anticoagulants: Secondary | ICD-10-CM | POA: Diagnosis not present

## 2022-02-13 DIAGNOSIS — I480 Paroxysmal atrial fibrillation: Secondary | ICD-10-CM | POA: Diagnosis not present

## 2022-02-13 DIAGNOSIS — I5022 Chronic systolic (congestive) heart failure: Secondary | ICD-10-CM | POA: Diagnosis not present

## 2022-02-13 DIAGNOSIS — R112 Nausea with vomiting, unspecified: Secondary | ICD-10-CM | POA: Diagnosis not present

## 2022-02-13 DIAGNOSIS — R197 Diarrhea, unspecified: Secondary | ICD-10-CM | POA: Diagnosis not present

## 2022-02-13 DIAGNOSIS — E782 Mixed hyperlipidemia: Secondary | ICD-10-CM | POA: Diagnosis not present

## 2022-02-13 DIAGNOSIS — I25118 Atherosclerotic heart disease of native coronary artery with other forms of angina pectoris: Secondary | ICD-10-CM | POA: Diagnosis not present

## 2022-02-15 ENCOUNTER — Encounter (HOSPITAL_COMMUNITY): Payer: BC Managed Care – PPO

## 2022-02-15 NOTE — Progress Notes (Signed)
Cardiac Individual Treatment Plan  Patient Details  Name: Alex Arias MRN: [id number] Date of Birth: August 09, 1955 Referring Provider:   Flowsheet Row INTENSIVE CARDIAC REHAB ORIENT from 01/05/2022 in [facility]  Referring Provider Bryan Lemma, MD       Initial Encounter Date:  Flowsheet Row INTENSIVE CARDIAC REHAB ORIENT from 01/05/2022 in [facility]  Date 01/05/22       Visit Diagnosis: 10/30/21 NSTEMI  10/30/21 S/P DES LAD  Patient's Home Medications on Admission:  Current Outpatient Medications:    apixaban (ELIQUIS) 5 MG TABS tablet, Take 1 tablet (5 mg total) by mouth 2 (two) times daily., Disp: 60 tablet, Rfl: 3   atorvastatin (LIPITOR) 40 MG tablet, Take 1 tablet (40 mg total) by mouth daily., Disp: 90 tablet, Rfl: 0   Cholecalciferol (VITAMIN D3 PO), Take 1 capsule by mouth every evening., Disp: , Rfl:    clopidogrel (PLAVIX) 75 MG tablet, Take 1 tablet (75 mg total) by mouth daily., Disp: 90 tablet, Rfl: 2   empagliflozin (JARDIANCE) 10 MG TABS tablet, Take 1 tablet (10 mg total) by mouth daily., Disp: 30 tablet, Rfl: 2   levothyroxine (SYNTHROID) 88 MCG tablet, Take 88 mcg by mouth daily before breakfast., Disp: , Rfl:    Multiple Vitamin (MULITIVITAMIN WITH MINERALS) TABS, Take 1 tablet by mouth every evening., Disp: , Rfl:    nitroGLYCERIN (NITROSTAT) 0.4 MG SL tablet, Place 1 tablet (0.4 mg total) under the tongue every 5 (five) minutes as needed for chest pain., Disp: 25 tablet, Rfl: 2   pantoprazole (PROTONIX) 40 MG tablet, Take 40 mg by mouth in the morning., Disp: , Rfl:    sacubitril-valsartan (ENTRESTO) 24-26 MG, Take 1 tablet by mouth 2 (two) times daily., Disp: 60 tablet, Rfl: 11   spironolactone (ALDACTONE) 25 MG tablet, Take 1 tablet (25 mg total) by mouth daily., Disp: 90 tablet, Rfl: 3   Study - EVOLVE-MI - evolocumab (REPATHA) 140 mg/mL  SQ injection (PI-Stuckey), Inject 140 mg into the skin every 14 (fourteen) days. For Investigational Use Only. Inject subcutaneously into abdomen, thigh, or upper arm every 14 days. Rotate injection sites and do not inject into areas where skin is tender, bruised, or red. Please contact [facility] for any questions or concerns regarding this medication., Disp: , Rfl:    tadalafil (CIALIS) 5 MG tablet, Take 5 mg by mouth every evening., Disp: , Rfl:   Past Medical History: Past Medical History:  Diagnosis Date   Abdominal distension    Abdominal pain    Arrhythmia    Coronary artery disease involving native coronary artery of native heart with unstable angina pectoris (HCC) 10/30/2021   Cath-PCI 10/30/2021: 99% thrombotic SubTO of mLAD just after D2 -> DES PCI 3.0 x 16 Synergy XD => deployed to 3.0 mm.  2 big Diags, LCx-OM1 & Large Dominant RCA - rPDA & PAV-RPL1-3 free of disease. EF ~40-45%, LVEDP 21--25 mmHg mid-apical anterior & apical HK.   Cough    Family history of diabetes mellitus    Itching    NSTEMI (non-ST elevated myocardial infarction) (HCC) - + Troponin with Anterior & Inferior ST depressions => ongoing CP => Urgent Cath 10/30/2021   Cath with 99% subTO of LAD => DES PCI   Pleurisy    Presence of drug coated stent in LAD coronary artery 10/30/2021   mLAD 99% thrombotic lesion: DES PCI w/  Synergy XD 3 x 16 => deployed ~3-3.1 mm   Right inguinal hernia    Unspecified viral infection, in conditions classified elsewhere and of unspecified site    viral syndrome - per notes from [facility] dated 01/03/11    Tobacco Use: Social History   Tobacco Use  Smoking Status Never  Smokeless Tobacco Never    Labs: Review Flowsheet       Latest Ref Rng & Units 04/02/2014 07/24/2014 10/31/2021 01/27/2022  Labs for ITP Cardiac and Pulmonary Rehab  Cholestrol 100 - 199 mg/dL - - 122  87   LDL (calc) 0 - 99 mg/dL - - 57  17   HDL-C >39 mg/dL - - 44  53   Trlycerides 0 -  149 mg/dL - - 106  83   Hemoglobin A1c 4.8 - 5.6 % - - 5.1  -  TCO2 0 - 100 mmol/L 19  27  - -    Capillary Blood Glucose: No results found for: "GLUCAP"   Exercise Target Goals: Exercise Program Goal: Individual exercise prescription set using results from initial 6 min walk test and THRR while considering  patient's activity barriers and safety.   Exercise Prescription Goal: Initial exercise prescription builds to 30-45 minutes a day of aerobic activity, 2-3 days per week.  Home exercise guidelines will be given to patient during program as part of exercise prescription that the participant will acknowledge.  Activity Barriers & Risk Stratification:  Activity Barriers & Cardiac Risk Stratification - 01/05/22 1443       Activity Barriers & Cardiac Risk Stratification   Activity Barriers Decreased Ventricular Function    Cardiac Risk Stratification High   <5 METs on 6MWT            6 Minute Walk:  6 Minute Walk     Row Name 01/05/22 1442         6 Minute Walk   Phase Initial     Distance 1628 feet     Walk Time 6 minutes     # of Rest Breaks 0     MPH 3.08     METS 3.41     RPE 11     Perceived Dyspnea  0     VO2 Peak 11.94     Symptoms No     Resting HR 47 bpm     Resting BP 116/78     Resting Oxygen Saturation  99 %     Exercise Oxygen Saturation  during 6 min walk 100 %     Max Ex. HR 76 bpm     Max Ex. BP 122/80     2 Minute Post BP 116/76              Oxygen Initial Assessment:   Oxygen Re-Evaluation:   Oxygen Discharge (Final Oxygen Re-Evaluation):   Initial Exercise Prescription:  Initial Exercise Prescription - 01/05/22 1400       Date of Initial Exercise RX and Referring Provider   Date 01/05/22    Referring Provider Glenetta Hew, MD    Expected Discharge Date 03/03/22      NuStep   Level 3    SPM 75    Minutes 15    METs 3      Elliptical   Level 1    Minutes 15    METs 3      Prescription Details   Frequency  (times per week) 3    Duration Progress to 30 minutes  of continuous aerobic without signs/symptoms of physical distress      Intensity   THRR 40-80% of Max Heartrate 60-121    Ratings of Perceived Exertion 11-13    Perceived Dyspnea 0-4      Progression   Progression Continue progressive overload as per policy without signs/symptoms or physical distress.      Resistance Training   Training Prescription Yes    Weight 4.    Reps 10-15             Perform Capillary Blood Glucose checks as needed.  Exercise Prescription Changes:   Exercise Prescription Changes     Row Name 01/27/22 1613             Response to Exercise   Blood Pressure (Admit) 112/68       Blood Pressure (Exercise) 128/78       Blood Pressure (Exit) 118/70       Heart Rate (Admit) 56 bpm       Heart Rate (Exercise) 112 bpm       Heart Rate (Exit) 65 bpm       Rating of Perceived Exertion (Exercise) 11       Symptoms none       Comments Pt first day in the CRP2 program       Duration Progress to 30 minutes of  aerobic without signs/symptoms of physical distress       Intensity THRR unchanged         Progression   Progression Continue to progress workloads to maintain intensity without signs/symptoms of physical distress.       Average METs 3.55         Resistance Training   Training Prescription Yes       Weight 4 lbs wts       Reps 10-15       Time 10 Minutes         NuStep   Level 4       SPM 85       Minutes 15       METs 2.6         Elliptical   Level 1       Minutes 15       METs 4.5                Exercise Comments:   Exercise Comments     Row Name 01/27/22 1619           Exercise Comments Pt first day in the CRP2 program. Pt tolerated exercise well with anaverage MET level of 3.55. Pt is learning his THRR, RPE and ExRx.                Exercise Goals and Review:   Exercise Goals     Row Name 01/05/22 1452             Exercise Goals   Increase  Physical Activity Yes       Intervention Provide advice, education, support and counseling about physical activity/exercise needs.;Develop an individualized exercise prescription for aerobic and resistive training based on initial evaluation findings, risk stratification, comorbidities and participant's personal goals.       Expected Outcomes Short Term: Attend rehab on a regular basis to increase amount of physical activity.;Long Term: Exercising regularly at least 3-5 days a week.;Long Term: Add in home exercise to make exercise part of routine and to increase amount of physical activity.  Increase Strength and Stamina Yes       Intervention Provide advice, education, support and counseling about physical activity/exercise needs.;Develop an individualized exercise prescription for aerobic and resistive training based on initial evaluation findings, risk stratification, comorbidities and participant's personal goals.       Expected Outcomes Short Term: Increase workloads from initial exercise prescription for resistance, speed, and METs.;Short Term: Perform resistance training exercises routinely during rehab and add in resistance training at home;Long Term: Improve cardiorespiratory fitness, muscular endurance and strength as measured by increased METs and functional capacity ( )       Able to understand and use rate of perceived exertion (RPE) scale Yes       Intervention Provide education and explanation on how to use RPE scale       Expected Outcomes Short Term: Able to use RPE daily in rehab to express subjective intensity level;Long Term:  Able to use RPE to guide intensity level when exercising independently       Knowledge and understanding of Target Heart Rate Range (THRR) Yes       Intervention Provide education and explanation of THRR including how the numbers were predicted and where they are located for reference       Expected Outcomes Short Term: Able to state/look up THRR;Short  Term: Able to use daily as guideline for intensity in rehab;Long Term: Able to use THRR to govern intensity when exercising independently       Understanding of Exercise Prescription Yes       Intervention Provide education, explanation, and written materials on patient's individual exercise prescription       Expected Outcomes Short Term: Able to explain program exercise prescription;Long Term: Able to explain home exercise prescription to exercise independently                Exercise Goals Re-Evaluation :  Exercise Goals Re-Evaluation     Row Name 01/27/22 1617             Exercise Goal Re-Evaluation   Exercise Goals Review Increase Physical Activity;Understanding of Exercise Prescription;Increase Strength and Stamina;Knowledge and understanding of Target Heart Rate Range (THRR);Able to understand and use rate of perceived exertion (RPE) scale       Comments Pt first day in the CRP2 program. Pt tolerated exercise well with anaverage MET level of 3.55. Pt is learning his THRR, RPE and ExRx.       Expected Outcomes Will continue to monitor pt and progress workloads as tolerated without sign or symptom.                Discharge Exercise Prescription (Final Exercise Prescription Changes):  Exercise Prescription Changes - 01/27/22 1613       Response to Exercise   Blood Pressure (Admit) 112/68    Blood Pressure (Exercise) 128/78    Blood Pressure (Exit) 118/70    Heart Rate (Admit) 56 bpm    Heart Rate (Exercise) 112 bpm    Heart Rate (Exit) 65 bpm    Rating of Perceived Exertion (Exercise) 11    Symptoms none    Comments Pt first day in the CRP2 program    Duration Progress to 30 minutes of  aerobic without signs/symptoms of physical distress    Intensity THRR unchanged      Progression   Progression Continue to progress workloads to maintain intensity without signs/symptoms of physical distress.    Average METs 3.55      Resistance Training   Training Prescription  Yes    Weight 4 lbs wts    Reps 10-15    Time 10 Minutes      NuStep   Level 4    SPM 85    Minutes 15    METs 2.6      Elliptical   Level 1    Minutes 15    METs 4.5             Nutrition:  Target Goals: Understanding of nutrition guidelines, daily intake of sodium 1500mg , cholesterol 200mg , calories 30% from fat and 7% or less from saturated fats, daily to have 5 or more servings of fruits and vegetables.  Biometrics:  Pre Biometrics - 01/05/22 1439       Pre Biometrics   Waist Circumference 43 inches    Hip Circumference 43.5 inches    Waist to Hip Ratio 0.99 %    Triceps Skinfold 13 mm    % Body Fat 27.7 %    Grip Strength 42 kg    Flexibility 15.75 in    Single Leg Stand 20.93 seconds              Nutrition Therapy Plan and Nutrition Goals:  Nutrition Therapy & Goals - 01/27/22 1540       Nutrition Therapy   Diet Heart Healthy Diet    Drug/Food Interactions Statins/Certain Fruits      Personal Nutrition Goals   Nutrition Goal Patient to identify strategies for reducing cardiovascular risk by attending the Pritikin education and nutrition series weekly    Personal Goal #2 Patient to improve diet quality by using the plate method as a guide for meal planning to include lean protein/plant protein, fruits, vegetables, whole grains, and nonfat dairy as part of a balanced diet    Personal Goal #3 Patient to reduce sodium to 1500mg  per day    Comments Patient reports normal appetite and eating two meals daily, skipping lunch. 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 Lipids are at goal and A1c WNL. His wife is very supportive of making lifestyle changes; she is a Engineer, civil (consulting). He will continue to benefit from attending Intensive Cardiac Rehab for nutrition, exercise and lifestyle modification.      Intervention Plan   Intervention Prescribe, educate and counsel regarding individualized specific dietary modifications aiming towards targeted core components such as  weight, hypertension, lipid management, diabetes, heart failure and other comorbidities.;Nutrition handout(s) given to patient.    Expected Outcomes Short Term Goal: Understand basic principles of dietary content, such as calories, fat, sodium, cholesterol and nutrients.;Long Term Goal: Adherence to prescribed nutrition plan.             Nutrition Assessments:  Nutrition Assessments - 01/05/22 1320       Rate Your Plate Scores   Pre Score 54            MEDIFICTS Score Key: ?70 Need to make dietary changes  40-70 Heart Healthy Diet ? 40 Therapeutic Level Cholesterol Diet   Flowsheet Row INTENSIVE CARDIAC REHAB ORIENT from [facility]/05/2022 in [facility]  Picture Your Plate Total Score on Admission 54      Picture Your Plate Scores: <28 Unhealthy dietary pattern with much room for improvement. 41-50 Dietary pattern unlikely to meet recommendations for good health and room for improvement. 51-60 More healthful dietary pattern, with some room for improvement.  >60 Healthy dietary pattern, although there may be some specific behaviors that could be improved.  Nutrition Goals Re-Evaluation:  Nutrition Goals Re-Evaluation     Row Name 01/27/22 1540             Goals   Comment lipids at goal WNL, A1c WNL, lipoprotein A 115.1       Expected Outcome Patient reports normal appetite and eating two meals daily, skipping lunch. 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 Lipids are at goal and A1c WNL. His wife is very supportive of making lifestyle changes; she is a Engineer, civil (consulting). He will continue to benefit from attending Intensive Cardiac Rehab for nutrition, exercise and lifestyle modification.                Nutrition Goals Re-Evaluation:  Nutrition Goals Re-Evaluation     Row Name 01/27/22 1540             Goals   Comment lipids at goal WNL, A1c WNL, lipoprotein A 115.1       Expected Outcome Patient reports normal  appetite and eating two meals daily, skipping lunch. 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 Lipids are at goal and A1c WNL. His wife is very supportive of making lifestyle changes; she is a Engineer, civil (consulting). He will continue to benefit from attending Intensive Cardiac Rehab for nutrition, exercise and lifestyle modification.                Nutrition Goals Discharge (Final Nutrition Goals Re-Evaluation):  Nutrition Goals Re-Evaluation - 01/27/22 1540       Goals   Comment lipids at goal WNL, A1c WNL, lipoprotein A 115.1    Expected Outcome Patient reports normal appetite and eating two meals daily, skipping lunch. 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 Lipids are at goal and A1c WNL. His wife is very supportive of making lifestyle changes; she is a Engineer, civil (consulting). He will continue to benefit from attending Intensive Cardiac Rehab for nutrition, exercise and lifestyle modification.             Psychosocial: Target Goals: Acknowledge presence or absence of significant depression and/or stress, maximize coping skills, provide positive support system. Participant is able to verbalize types and ability to use techniques and skills needed for reducing stress and depression.  Initial Review & Psychosocial Screening:  Initial Psych Review & Screening - 01/05/22 1538       Initial Review   Current issues with None Identified      Family Dynamics   Good Support System? Yes   Alex Arias has his wife and two daughter for support     Barriers   Psychosocial barriers to participate in program There are no identifiable barriers or psychosocial needs.      Screening Interventions   Interventions Encouraged to exercise             Quality of Life Scores:  Quality of Life - 01/05/22 1453       Quality of Life   Select Quality of Life      Quality of Life Scores   Health/Function Pre 26.73 %    Socioeconomic Pre 25.42 %    Psych/Spiritual Pre 27.71 %    Family Pre 26.4 %    GLOBAL Pre 26.65 %             Scores of 19 and below usually indicate a poorer quality of life in these areas.  A difference of  2-3 points is a clinically meaningful difference.  A difference of 2-3 points in the total score of the Quality of Life Index has been associated with significant  improvement in overall quality of life, self-image, physical symptoms, and general health in studies assessing change in quality of life.  PHQ-9: Review Flowsheet       01/05/2022  Depression screen PHQ 2/9  Decreased Interest 0  Down, Depressed, Hopeless 0  PHQ - 2 Score 0   Interpretation of Total Score  Total Score Depression Severity:  1-4 = Minimal depression, 5-9 = Mild depression, 10-14 = Moderate depression, 15-19 = Moderately severe depression, 20-27 = Severe depression   Psychosocial Evaluation and Intervention:   Psychosocial Re-Evaluation:  Psychosocial Re-Evaluation     Row Name 01/27/22 1539 02/15/22 1308           Psychosocial Re-Evaluation   Current issues with None Identified None Identified      Interventions Encouraged to attend Cardiac Rehabilitation for the exercise Encouraged to attend Cardiac Rehabilitation for the exercise      Continue Psychosocial Services  No Follow up required No Follow up required               Psychosocial Discharge (Final Psychosocial Re-Evaluation):  Psychosocial Re-Evaluation - 02/15/22 1308       Psychosocial Re-Evaluation   Current issues with None Identified    Interventions Encouraged to attend Cardiac Rehabilitation for the exercise    Continue Psychosocial Services  No Follow up required             Vocational Rehabilitation: Provide vocational rehab assistance to qualifying candidates.   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 Participant will state understanding/return demonstration of topics presented.    Education     Row Name 01/27/22 1400     Education   Cardiac Education Topics Pritikin   Nurse, children's Exercise Physiologist   Select Psychosocial   Psychosocial Healthy Minds, Bodies, Hearts   Instruction Review Code 1- Verbalizes Understanding   Class Start Time 1358   Class Stop Time 1432   Class Time Calculation (min) 34 min    Row Name 01/30/22 1600     Education   Cardiac Education Topics Pritikin   Glass blower/designer Nutrition   Nutrition Workshop Label Reading   Instruction Review Code 1- Tax inspector   Class Start Time 1400   Class Stop Time 1458   Class Time Calculation (min) 58 min    Row Name 02/02/22 1000     Education   Cardiac Education Topics Pritikin   [employer]  completed 02/01/22     Cooking School   Educator Dietitian   Weekly Topic Delicious Desserts   Instruction Review Code 1- Tax inspector   Class Start Time 1400   Class Stop Time 1458   Class Time Calculation (min) 58 min    Row Name 02/06/22 1500     Education   Cardiac Education Topics Pritikin   Geographical information systems officer Psychosocial   Psychosocial Workshop Other  Focused Goals, Sustainable changes   Instruction  Review Code 1- Verbalizes Understanding   Class Start Time 1402   Class Stop Time 1445   Class Time Calculation (min) 43 min    Row Name 02/08/22 1600     Education   Cardiac Education Topics Pritikin   Customer service managerelect Cooking School     Cooking School   Educator Dietitian   Weekly Topic Tasty Appetizers and Snacks   Instruction Review Code 1- Verbalizes Understanding   Class Start Time 1402   Class Stop Time  1448   Class Time Calculation (min) 46 min    Row Name 02/13/22 1500     Education   Cardiac Education Topics Pritikin   Select Workshops     Workshops   Educator Exercise Physiologist   Select Exercise   Exercise Workshop Exercise Basics: Building Your Action Plan   Instruction Review Code 1- Verbalizes Understanding   Class Start Time 1359   Class Stop Time 1445   Class Time Calculation (min) 46 min            Core Videos: Exercise    Move It!  Clinical staff conducted group or individual video education with verbal and written material and guidebook.  Patient learns the recommended Pritikin exercise program. Exercise with the goal of living a long, healthy life. Some of the health benefits of exercise include controlled diabetes, healthier blood pressure levels, improved cholesterol levels, improved heart and lung capacity, improved sleep, and better body composition. Everyone should speak with their doctor before starting or changing an exercise routine.  Biomechanical Limitations Clinical staff conducted group or individual video education with verbal and written material and guidebook.  Patient learns how biomechanical limitations can impact exercise and how we can mitigate and possibly overcome limitations to have an impactful and balanced exercise routine.  Body Composition Clinical staff conducted group or individual video education with verbal and written material and guidebook.  Patient learns that body composition (ratio of muscle mass to fat mass) is a key component to assessing overall fitness, rather than body weight alone. Increased fat mass, especially visceral belly fat, can put us at increased risk for metabolic syndrome, type 2 diabetes, heart disease, and even death. It is recommended to combine diet and exercise (cardiovascular and resistance training) to improve your body composition. Seek guidance from your physician and exercise physiologist before  implementing an exercise routine.  Exercise Action Plan Clinical staff conducted group or individual video education with verbal and written material and guidebook.  Patient learns the recommended strategies to achieve and enjoy long-term exercise adherence, including variety, self-motivation, self-efficacy, and positive decision making. Benefits of exercise include fitness, good health, weight management, more energy, better sleep, less stress, and overall well-being.  Medical   Heart Disease Risk Reduction Clinical staff conducted group or individual video education with verbal and written material and guidebook.  Patient learns our heart is our most vital organ as it circulates oxygen, nutrients, white blood cells, and hormones throughout the entire body, and carries waste away. Data supports a plant-based eating plan like the Pritikin Program for its effectiveness in slowing progression of and reversing heart disease. The video provides a number of recommendations to address heart disease.   Metabolic Syndrome and Belly Fat  Clinical staff conducted group or individual video education with verbal and written material and guidebook.  Patient learns what metabolic syndrome is, how it leads to heart disease, and how one can reverse it and keep it from coming back. You have metabolic syndrome if  you have 3 of the following 5 criteria: abdominal obesity, high blood pressure, high triglycerides, low HDL cholesterol, and high blood sugar.  Hypertension and Heart Disease Clinical staff conducted group or individual video education with verbal and written material and guidebook.  Patient learns that high blood pressure, or hypertension, is very common in the [place]. Hypertension is largely due to excessive salt intake, but other important risk factors include being overweight, physical inactivity, drinking too much alcohol, smoking, and not eating enough potassium from fruits and vegetables. High  blood pressure is a leading risk factor for heart attack, stroke, congestive heart failure, dementia, kidney failure, and premature death. Long-term effects of excessive salt intake include stiffening of the arteries and thickening of heart muscle and organ damage. Recommendations include ways to reduce hypertension and the risk of heart disease.  Diseases of Our Time - Focusing on Diabetes Clinical staff conducted group or individual video education with verbal and written material and guidebook.  Patient learns why the best way to stop diseases of our time is prevention, through food and other lifestyle changes. Medicine (such as prescription pills and surgeries) is often only a Band-Aid on the problem, not a long-term solution. Most common diseases of our time include obesity, type 2 diabetes, hypertension, heart disease, and cancer. The Pritikin Program is recommended and has been proven to help reduce, reverse, and/or prevent the damaging effects of metabolic syndrome.  Nutrition   Overview of the Pritikin Eating Plan  Clinical staff conducted group or individual video education with verbal and written material and guidebook.  Patient learns about the Pritikin Eating Plan for disease risk reduction. The Pritikin Eating Plan emphasizes a wide variety of unrefined, minimally-processed carbohydrates, like fruits, vegetables, whole grains, and legumes. Go, Caution, and Stop food choices are explained. Plant-based and lean animal proteins are emphasized. Rationale provided for low sodium intake for blood pressure control, low added sugars for blood sugar stabilization, and low added fats and oils for coronary artery disease risk reduction and weight management.  Calorie Density  Clinical staff conducted group or individual video education with verbal and written material and guidebook.  Patient learns about calorie density and how it impacts the Pritikin Eating Plan. Knowing the characteristics of the  food you choose will help you decide whether those foods will lead to weight gain or weight loss, and whether you want to consume more or less of them. Weight loss is usually a side effect of the Pritikin Eating Plan because of its focus on low calorie-dense foods.  Label Reading  Clinical staff conducted group or individual video education with verbal and written material and guidebook.  Patient learns about the Pritikin recommended label reading guidelines and corresponding recommendations regarding calorie density, added sugars, sodium content, and whole grains.  Dining Out - Part 1  Clinical staff conducted group or individual video education with verbal and written material and guidebook.  Patient learns that restaurant meals can be sabotaging because they can be so high in calories, fat, sodium, and/or sugar. Patient learns recommended strategies on how to positively address this and avoid unhealthy pitfalls.  Facts on Fats  Clinical staff conducted group or individual video education with verbal and written material and guidebook.  Patient learns that lifestyle modifications can be just as effective, if not more so, as many medications for lowering your risk of heart disease. A Pritikin lifestyle can help to reduce your risk of inflammation and atherosclerosis (cholesterol build-up, or plaque, in the artery  walls). Lifestyle interventions such as dietary choices and physical activity address the cause of atherosclerosis. A review of the types of fats and their impact on blood cholesterol levels, along with dietary recommendations to reduce fat intake is also included.  Nutrition Action Plan  Clinical staff conducted group or individual video education with verbal and written material and guidebook.  Patient learns how to incorporate Pritikin recommendations into their lifestyle. Recommendations include planning and keeping personal health goals in mind as an important part of their  success.  Healthy Mind-Set    Healthy Minds, Bodies, Hearts  Clinical staff conducted group or individual video education with verbal and written material and guidebook.  Patient learns how to identify when they are stressed. Video will discuss the impact of that stress, as well as the many benefits of stress management. Patient will also be introduced to stress management techniques. The way we think, act, and feel has an impact on our hearts.  How Our Thoughts Can Heal Our Hearts  Clinical staff conducted group or individual video education with verbal and written material and guidebook.  Patient learns that negative thoughts can cause depression and anxiety. This can result in negative lifestyle behavior and serious health problems. Cognitive behavioral therapy is an effective method to help control our thoughts in order to change and improve our emotional outlook.  Additional Videos:  Exercise    Improving Performance  Clinical staff conducted group or individual video education with verbal and written material and guidebook.  Patient learns to use a non-linear approach by alternating intensity levels and lengths of time spent exercising to help burn more calories and lose more body fat. Cardiovascular exercise helps improve heart health, metabolism, hormonal balance, blood sugar control, and recovery from fatigue. Resistance training improves strength, endurance, balance, coordination, reaction time, metabolism, and muscle mass. Flexibility exercise improves circulation, posture, and balance. Seek guidance from your physician and exercise physiologist before implementing an exercise routine and learn your capabilities and proper form for all exercise.  Introduction to Yoga  Clinical staff conducted group or individual video education with verbal and written material and guidebook.  Patient learns about yoga, a discipline of the coming together of mind, breath, and body. The benefits of yoga  include improved flexibility, improved range of motion, better posture and core strength, increased lung function, weight loss, and positive self-image. Yoga's heart health benefits include lowered blood pressure, healthier heart rate, decreased cholesterol and triglyceride levels, improved immune function, and reduced stress. Seek guidance from your physician and exercise physiologist before implementing an exercise routine and learn your capabilities and proper form for all exercise.  Medical   Aging: Enhancing Your Quality of Life  Clinical staff conducted group or individual video education with verbal and written material and guidebook.  Patient learns key strategies and recommendations to stay in good physical health and enhance quality of life, such as prevention strategies, having an advocate, securing a Health Care Proxy and Power of Attorney, and keeping a list of medications and system for tracking them. It also discusses how to avoid risk for bone loss.  Biology of Weight Control  Clinical staff conducted group or individual video education with verbal and written material and guidebook.  Patient learns that weight gain occurs because we consume more calories than we burn (eating more, moving less). Even if your body weight is normal, you may have higher ratios of fat compared to muscle mass. Too much body fat puts you at increased risk for cardiovascular  disease, heart attack, stroke, type 2 diabetes, and obesity-related cancers. In addition to exercise, following the Pritikin Eating Plan can help reduce your risk.  Decoding Lab Results  Clinical staff conducted group or individual video education with verbal and written material and guidebook.  Patient learns that lab test reflects one measurement whose values change over time and are influenced by many factors, including medication, stress, sleep, exercise, food, hydration, pre-existing medical conditions, and more. It is recommended to  use the knowledge from this video to become more involved with your lab results and evaluate your numbers to speak with your doctor.   Diseases of Our Time - Overview  Clinical staff conducted group or individual video education with verbal and written material and guidebook.  Patient learns that according to the CDC, 50% to 70% of chronic diseases (such as obesity, type 2 diabetes, elevated lipids, hypertension, and heart disease) are avoidable through lifestyle improvements including healthier food choices, listening to satiety cues, and increased physical activity.  Sleep Disorders Clinical staff conducted group or individual video education with verbal and written material and guidebook.  Patient learns how good quality and duration of sleep are important to overall health and well-being. Patient also learns about sleep disorders and how they impact health along with recommendations to address them, including discussing with a physician.  Nutrition  Dining Out - Part 2 Clinical staff conducted group or individual video education with verbal and written material and guidebook.  Patient learns how to plan ahead and communicate in order to maximize their dining experience in a healthy and nutritious manner. Included are recommended food choices based on the type of restaurant the patient is visiting.   Fueling a Banker conducted group or individual video education with verbal and written material and guidebook.  There is a strong connection between our food choices and our health. Diseases like obesity and type 2 diabetes are very prevalent and are in large-part due to lifestyle choices. The Pritikin Eating Plan provides plenty of food and hunger-curbing satisfaction. It is easy to follow, affordable, and helps reduce health risks.  Menu Workshop  Clinical staff conducted group or individual video education with verbal and written material and guidebook.  Patient learns  that restaurant meals can sabotage health goals because they are often packed with calories, fat, sodium, and sugar. Recommendations include strategies to plan ahead and to communicate with the manager, chef, or server to help order a healthier meal.  Planning Your Eating Strategy  Clinical staff conducted group or individual video education with verbal and written material and guidebook.  Patient learns about the Pritikin Eating Plan and its benefit of reducing the risk of disease. The Pritikin Eating Plan does not focus on calories. Instead, it emphasizes high-quality, nutrient-rich foods. By knowing the characteristics of the foods, we choose, we can determine their calorie density and make informed decisions.  Targeting Your Nutrition Priorities  Clinical staff conducted group or individual video education with verbal and written material and guidebook.  Patient learns that lifestyle habits have a tremendous impact on disease risk and progression. This video provides eating and physical activity recommendations based on your personal health goals, such as reducing LDL cholesterol, losing weight, preventing or controlling type 2 diabetes, and reducing high blood pressure.  Vitamins and Minerals  Clinical staff conducted group or individual video education with verbal and written material and guidebook.  Patient learns different ways to obtain key vitamins and minerals, including through a recommended  healthy diet. It is important to discuss all supplements you take with your doctor.   Healthy Mind-Set    Smoking Cessation  Clinical staff conducted group or individual video education with verbal and written material and guidebook.  Patient learns that cigarette smoking and tobacco addiction pose a serious health risk which affects millions of people. Stopping smoking will significantly reduce the risk of heart disease, lung disease, and many forms of cancer. Recommended strategies for quitting  are covered, including working with your doctor to develop a successful plan.  Culinary   Becoming a Set designerritikin Chef  Clinical staff conducted group or individual video education with verbal and written material and guidebook.  Patient learns that cooking at home can be healthy, cost-effective, quick, and puts them in control. Keys to cooking healthy recipes will include looking at your recipe, assessing your equipment needs, planning ahead, making it simple, choosing cost-effective seasonal ingredients, and limiting the use of added fats, salts, and sugars.  Cooking - Breakfast and Snacks  Clinical staff conducted group or individual video education with verbal and written material and guidebook.  Patient learns how important breakfast is to satiety and nutrition through the entire day. Recommendations include key foods to eat during breakfast to help stabilize blood sugar levels and to prevent overeating at meals later in the day. Planning ahead is also a key component.  Cooking - Educational psychologistDinner and Sides  Clinical staff conducted group or individual video education with verbal and written material and guidebook.  Patient learns eating strategies to improve overall health, including an approach to cook more at home. Recommendations include thinking of animal protein as a side on your plate rather than center stage and focusing instead on lower calorie dense options like vegetables, fruits, whole grains, and plant-based proteins, such as beans. Making sauces in large quantities to freeze for later and leaving the skin on your vegetables are also recommended to maximize your experience.  Cooking - Healthy Salads and Dressing Clinical staff conducted group or individual video education with verbal and written material and guidebook.  Patient learns that vegetables, fruits, whole grains, and legumes are the foundations of the Pritikin Eating Plan. Recommendations include how to incorporate each of these in  flavorful and healthy salads, and how to create homemade salad dressings. Proper handling of ingredients is also covered. Cooking - Soups and State FarmDesserts  Cooking - Soups and Desserts Clinical staff conducted group or individual video education with verbal and written material and guidebook.  Patient learns that Pritikin soups and desserts make for easy, nutritious, and delicious snacks and meal components that are low in sodium, fat, sugar, and calorie density, while high in vitamins, minerals, and filling fiber. Recommendations include simple and healthy ideas for soups and desserts.   Overview     The Pritikin Solution Program Overview Clinical staff conducted group or individual video education with verbal and written material and guidebook.  Patient learns that the results of the Pritikin Program have been documented in more than 100 articles published in peer-reviewed journals, and the benefits include reducing risk factors for (and, in some cases, even reversing) high cholesterol, high blood pressure, type 2 diabetes, obesity, and more! An overview of the three key pillars of the Pritikin Program will be covered: eating well, doing regular exercise, and having a healthy mind-set.  WORKSHOPS  Exercise: Exercise Basics: Building Your Action Plan Clinical staff led group instruction and group discussion with PowerPoint presentation and patient guidebook. To enhance the learning environment the  use of posters, models and videos may be added. At the conclusion of this workshop, patients will comprehend the difference between physical activity and exercise, as well as the benefits of incorporating both, into their routine. Patients will understand the FITT (Frequency, Intensity, Time, and Type) principle and how to use it to build an exercise action plan. In addition, safety concerns and other considerations for exercise and cardiac rehab will be addressed by the presenter. The purpose of this  lesson is to promote a comprehensive and effective weekly exercise routine in order to improve patients' overall level of fitness.   Managing Heart Disease: Your Path to a Healthier Heart Clinical staff led group instruction and group discussion with PowerPoint presentation and patient guidebook. To enhance the learning environment the use of posters, models and videos may be added.At the conclusion of this workshop, patients will understand the anatomy and physiology of the heart. Additionally, they will understand how Pritikin's three pillars impact the risk factors, the progression, and the management of heart disease.  The purpose of this lesson is to provide a high-level overview of the heart, heart disease, and how the Pritikin lifestyle positively impacts risk factors.  Exercise Biomechanics Clinical staff led group instruction and group discussion with PowerPoint presentation and patient guidebook. To enhance the learning environment the use of posters, models and videos may be added. Patients will learn how the structural parts of their bodies function and how these functions impact their daily activities, movement, and exercise. Patients will learn how to promote a neutral spine, learn how to manage pain, and identify ways to improve their physical movement in order to promote healthy living. The purpose of this lesson is to expose patients to common physical limitations that impact physical activity. Participants will learn practical ways to adapt and manage aches and pains, and to minimize their effect on regular exercise. Patients will learn how to maintain good posture while sitting, walking, and lifting.  Balance Training and Fall Prevention  Clinical staff led group instruction and group discussion with PowerPoint presentation and patient guidebook. To enhance the learning environment the use of posters, models and videos may be added. At the conclusion of this workshop,  patients will understand the importance of their sensorimotor skills (vision, proprioception, and the vestibular system) in maintaining their ability to balance as they age. Patients will apply a variety of balancing exercises that are appropriate for their current level of function. Patients will understand the common causes for poor balance, possible solutions to these problems, and ways to modify their physical environment in order to minimize their fall risk. The purpose of this lesson is to teach patients about the importance of maintaining balance as they age and ways to minimize their risk of falling.  WORKSHOPS   Nutrition:  Fueling a Ship brokerHealthy Body Clinical staff led group instruction and group discussion with PowerPoint presentation and patient guidebook. To enhance the learning environment the use of posters, models and videos may be added. Patients will review the foundational principles of the Pritikin Eating Plan and understand what constitutes a serving size in each of the food groups. Patients will also learn Pritikin-friendly foods that are better choices when away from home and review make-ahead meal and snack options. Calorie density will be reviewed and applied to three nutrition priorities: weight maintenance, weight loss, and weight gain. The purpose of this lesson is to reinforce (in a group setting) the key concepts around what patients are recommended to eat and how to apply  these guidelines when away from home by planning and selecting Pritikin-friendly options. Patients will understand how calorie density may be adjusted for different weight management goals.  Mindful Eating  Clinical staff led group instruction and group discussion with PowerPoint presentation and patient guidebook. To enhance the learning environment the use of posters, models and videos may be added. Patients will briefly review the concepts of the [place] and the importance of low-calorie dense  foods. The concept of mindful eating will be introduced as well as the importance of paying attention to internal hunger signals. Triggers for non-hunger eating and techniques for dealing with triggers will be explored. The purpose of this lesson is to provide patients with the opportunity to review the basic principles of the [place], discuss the value of eating mindfully and how to measure internal cues of hunger and fullness using the Hunger Scale. Patients will also discuss reasons for non-hunger eating and learn strategies to use for controlling emotional eating.  Targeting Your Nutrition Priorities Clinical staff led group instruction and group discussion with PowerPoint presentation and patient guidebook. To enhance the learning environment the use of posters, models and videos may be added. Patients will learn how to determine their genetic susceptibility to disease by reviewing their family history. Patients will gain insight into the importance of diet as part of an overall healthy lifestyle in mitigating the impact of genetics and other environmental insults. The purpose of this lesson is to provide patients with the opportunity to assess their personal nutrition priorities by looking at their family history, their own health history and current risk factors. Patients will also be able to discuss ways of prioritizing and modifying the [place] for their highest risk areas  Menu  Clinical staff led group instruction and group discussion with PowerPoint presentation and patient guidebook. To enhance the learning environment the use of posters, models and videos may be added. Using menus brought in from [employer], or printed from [employer], patients will apply the [place] dining out guidelines that were presented in the [employer] video. Patients will also be able to practice these guidelines in a variety of provided scenarios. The purpose of  this lesson is to provide patients with the opportunity to practice hands-on learning of the [place] with actual menus and practice scenarios.  Label Reading Clinical staff led group instruction and group discussion with PowerPoint presentation and patient guidebook. To enhance the learning environment the use of posters, models and videos may be added. Patients will review and discuss the Pritikin label reading guidelines presented in Pritikin's Label Reading Educational series video. Using fool labels brought in from local grocery stores and markets, patients will apply the label reading guidelines and determine if the packaged food meet the Pritikin guidelines. The purpose of this lesson is to provide patients with the opportunity to review, discuss, and practice hands-on learning of the Pritikin Label Reading guidelines with actual packaged food labels. [place] Workshops are designed to teach patients ways to prepare quick, simple, and affordable recipes at home. The importance of nutrition's role in chronic disease risk reduction is reflected in its emphasis in the overall Pritikin program. By learning how to prepare essential core Pritikin Eating Plan recipes, patients will increase control over what they eat; be able to customize the flavor of foods without the use of added salt, sugar, or fat; and improve the quality of the food they consume.  By learning a set of core recipes which are easily assembled, quickly prepared, and affordable, patients are more likely to prepare more healthy foods at home. These workshops focus on convenient breakfasts, simple entres, side dishes, and desserts which can be prepared with minimal effort and are consistent with nutrition recommendations for cardiovascular risk reduction. Cooking [employer] are taught by a Engineer, materials (RD) who has been trained by the [employer]. The  chef or RD has a clear understanding of the importance of minimizing - if not completely eliminating - added fat, sugar, and sodium in recipes. Throughout the series of [place] Workshop sessions, patients will learn about healthy ingredients and efficient methods of cooking to build confidence in their capability to prepare    Cooking School weekly topics:  Adding Flavor- Sodium-Free  Fast and Healthy Breakfasts  Powerhouse Plant-Based Proteins  Satisfying Salads and Dressings  Simple Sides and Sauces  International Cuisine-Spotlight on the [place] Zones  Delicious Desserts  Savory Soups  Efficiency Cooking - Meals in a Snap  Tasty Appetizers and Snacks  Comforting Weekend Breakfasts  One-Pot Wonders   Fast Evening Meals  Easy [place] (Psychosocial): New Thoughts, New Behaviors Clinical staff led group instruction and group discussion with PowerPoint presentation and patient guidebook. To enhance the learning environment the use of posters, models and videos may be added. Patients will learn and practice techniques for developing effective health and lifestyle goals. Patients will be able to effectively apply the goal setting process learned to develop at least one new personal goal.  The purpose of this lesson is to expose patients to a new skill set of behavior modification techniques such as techniques setting SMART goals, overcoming barriers, and achieving new thoughts and new behaviors.  Managing Moods and Relationships Clinical staff led group instruction and group discussion with PowerPoint presentation and patient guidebook. To enhance the learning environment the use of posters, models and videos may be added. Patients will learn how emotional and chronic stress factors can impact their health and relationships. They will learn healthy ways to manage their moods and utilize positive coping mechanisms. In  addition, ICR patients will learn ways to improve communication skills. The purpose of this lesson is to expose patients to ways of understanding how one's mood and health are intimately connected. Developing a healthy outlook can help build positive relationships and connections with others. Patients will understand the importance of utilizing effective communication skills that include actively listening and being heard. They will learn and understand the importance of the "4 Cs" and especially Connections in fostering of a Healthy Mind-Set.  Healthy Sleep for a Healthy Heart Clinical staff led group instruction and group discussion with PowerPoint presentation and patient guidebook. To enhance the learning environment the use of posters, models and videos may be added. At the conclusion of this workshop, patients will be able to demonstrate knowledge of the importance of sleep to overall health, well-being, and quality of life. They will understand the symptoms of, and treatments for, common sleep disorders. Patients will also be able to identify daytime and nighttime behaviors which impact sleep, and they will be able to apply these tools to help manage sleep-related challenges. The purpose of this lesson is to provide patients with a general overview of sleep and outline the importance of quality sleep. Patients will learn about a few of the most common sleep disorders. Patients will also be  introduced to the concept of "sleep hygiene," and discover ways to self-manage certain sleeping problems through simple daily behavior changes. Finally, the workshop will motivate patients by clarifying the links between quality sleep and their goals of heart-healthy living.   Recognizing and Reducing Stress Clinical staff led group instruction and group discussion with PowerPoint presentation and patient guidebook. To enhance the learning environment the use of posters, models and videos may be added. At the  conclusion of this workshop, patients will be able to understand the types of stress reactions, differentiate between acute and chronic stress, and recognize the impact that chronic stress has on their health. They will also be able to apply different coping mechanisms, such as reframing negative self-talk. Patients will have the opportunity to practice a variety of stress management techniques, such as deep abdominal breathing, progressive muscle relaxation, and/or guided imagery.  The purpose of this lesson is to educate patients on the role of stress in their lives and to provide healthy techniques for coping with it.  Learning Barriers/Preferences:  Learning Barriers/Preferences - 01/05/22 1458       Learning Barriers/Preferences   Learning Barriers Sight   wears glasses   Learning Preferences Computer/Internet;Audio;Group Instruction;Individual Instruction;Pictoral;Skilled Demonstration;Verbal Instruction;Video;Written Material             Education Topics:  Knowledge Questionnaire Score:  Knowledge Questionnaire Score - 01/05/22 1458       Knowledge Questionnaire Score   Pre Score 20/24             Core Components/Risk Factors/Patient Goals at Admission:  Personal Goals and Risk Factors at Admission - 01/05/22 1458       Core Components/Risk Factors/Patient Goals on Admission    Weight Management Yes    Intervention Weight Management: Develop a combined nutrition and exercise program designed to reach desired caloric intake, while maintaining appropriate intake of nutrient and fiber, sodium and fats, and appropriate energy expenditure required for the weight goal.;Weight Management: Provide education and appropriate resources to help participant work on and attain dietary goals.    Expected Outcomes Short Term: Continue to assess and modify interventions until short term weight is achieved;Long Term: Adherence to nutrition and physical activity/exercise program aimed  toward attainment of established weight goal;Understanding recommendations for meals to include 15-35% energy as protein, 25-35% energy from fat, 35-60% energy from carbohydrates, less than 200mg  of dietary cholesterol, 20-35 gm of total fiber daily;Understanding of distribution of calorie intake throughout the day with the consumption of 4-5 meals/snacks    Heart Failure Yes    Intervention Provide a combined exercise and nutrition program that is supplemented with education, support and counseling about heart failure. Directed toward relieving symptoms such as shortness of breath, decreased exercise tolerance, and extremity edema.    Expected Outcomes Improve functional capacity of life;Short term: Attendance in program 2-3 days a week with increased exercise capacity. Reported lower sodium intake. Reported increased fruit and vegetable intake. Reports medication compliance.;Short term: Daily weights obtained and reported for increase. Utilizing diuretic protocols set by physician.;Long term: Adoption of self-care skills and reduction of barriers for early signs and symptoms recognition and intervention leading to self-care maintenance.    Hypertension Yes    Intervention Provide education on lifestyle modifcations including regular physical activity/exercise, weight management, moderate sodium restriction and increased consumption of fresh fruit, vegetables, and low fat dairy, alcohol moderation, and smoking cessation.;Monitor prescription use compliance.    Expected Outcomes Short Term: Continued assessment and intervention until BP is < 140/7mm  HG in hypertensive participants. < 130/70mm HG in hypertensive participants with diabetes, heart failure or chronic kidney disease.;Long Term: Maintenance of blood pressure at goal levels.    Lipids Yes    Intervention Provide education and support for participant on nutrition & aerobic/resistive exercise along with prescribed medications to achieve LDL 70mg ,  HDL >40mg .    Expected Outcomes Short Term: Participant states understanding of desired cholesterol values and is compliant with medications prescribed. Participant is following exercise prescription and nutrition guidelines.;Long Term: Cholesterol controlled with medications as prescribed, with individualized exercise RX and with personalized nutrition plan. Value goals: LDL < 70mg , HDL > 40 mg.             Core Components/Risk Factors/Patient Goals Review:   Goals and Risk Factor Review     Row Name 01/27/22 1540 02/15/22 1308           Core Components/Risk Factors/Patient Goals Review   Personal Goals Review Weight Management/Obesity;Hypertension;Lipids;Heart Failure Weight Management/Obesity;Hypertension;Lipids;Heart Failure      Review Alex Arias started intensive cardiac rehab on 01/16/22. Alex Arias did well with exercise. Alex Arias is doing well with exercise at intensive cardiac rehab. Alex Arias's vital signs have been stable. Alex Arias has lost 2.7 kg since starting cardiac rehab      Expected Outcomes Alex Arias will continue to participate in intensive cardiac rehab for exercise, nutrition and lifestyle modifications Alex Arias will continue to participate in intensive cardiac rehab for exercise, nutrition and lifestyle modifications               Core Components/Risk Factors/Patient Goals at Discharge (Final Review):   Goals and Risk Factor Review - 02/15/22 1308       Core Components/Risk Factors/Patient Goals Review   Personal Goals Review Weight Management/Obesity;Hypertension;Lipids;Heart Failure    Review Alex Arias is doing well with exercise at intensive cardiac rehab. Alex Arias's vital signs have been stable. Alex Arias has lost 2.7 kg since starting cardiac rehab    Expected Outcomes Alex Arias will continue to participate in intensive cardiac rehab for exercise, nutrition and lifestyle modifications             ITP Comments:  ITP Comments     Row Name 01/05/22 1536 01/17/22 0857 01/27/22 1538 02/15/22 1306     ITP  Comments Dr 02/17/22 MD, Medical Director, Introduction to [facility]/ Intensive cardiac rehab. Inital orientatin packet reviewed with the patient 30 Day ITP Review. [place] attended cardiac rehab on 01/05/22 and is scheduled to begin intensive cardiac rehab on 01/27/22. 30 Day ITP Review. Alex Arias started intensive cardiac rehab on 01/27/22 and did well with exercise 30 Day ITP Review. Alex Arias has good participation in intensive cardiac rehab when in attendance.             Comments: See ITP comments.03/28/22 RN BSN

## 2022-02-17 ENCOUNTER — Encounter (HOSPITAL_COMMUNITY)
Admission: RE | Admit: 2022-02-17 | Discharge: 2022-02-17 | Disposition: A | Discharge status: Home / Self Care | Payer: BC Managed Care – PPO | Source: Ambulatory Visit | Attending: Internal Medicine | Admitting: Internal Medicine

## 2022-02-17 ENCOUNTER — Encounter (HOSPITAL_COMMUNITY): Payer: BC Managed Care – PPO

## 2022-02-17 DIAGNOSIS — I252 Old myocardial infarction: Secondary | ICD-10-CM | POA: Diagnosis not present

## 2022-02-17 DIAGNOSIS — Z955 Presence of coronary angioplasty implant and graft: Secondary | ICD-10-CM | POA: Diagnosis not present

## 2022-02-17 DIAGNOSIS — I214 Non-ST elevation (NSTEMI) myocardial infarction: Secondary | ICD-10-CM | POA: Diagnosis not present

## 2022-02-17 DIAGNOSIS — Z48812 Encounter for surgical aftercare following surgery on the circulatory system: Secondary | ICD-10-CM | POA: Diagnosis not present

## 2022-02-17 NOTE — Progress Notes (Signed)
CARDIAC REHAB PHASE 2  Reviewed home exercise with pt today. Pt is tolerating exercise well. Pt will continue to exercise on his own by walking and elliptical at home for 30-45 minutes per session 2 days a week in addition to the 3 days in [facility]. Advised pt on THRR, RPE scale, hydration and temperature/humidity precautions. Reinforced NTG use, S/S to stop exercise and when to call MD vs 911. Encouraged warm up cool down and stretches with exercise sessions. Pt verbalized understanding, all questions were answered and pt was given a copy to take home.    Kirby Funk ACSM-CEP 02/17/2022 5:06 PM

## 2022-02-20 ENCOUNTER — Encounter (HOSPITAL_COMMUNITY): Payer: BC Managed Care – PPO

## 2022-02-20 ENCOUNTER — Ambulatory Visit (HOSPITAL_COMMUNITY): Payer: BC Managed Care – PPO

## 2022-02-22 ENCOUNTER — Encounter (HOSPITAL_COMMUNITY): Payer: BC Managed Care – PPO

## 2022-02-24 ENCOUNTER — Encounter (HOSPITAL_COMMUNITY): Payer: BC Managed Care – PPO

## 2022-02-25 ENCOUNTER — Other Ambulatory Visit: Payer: Self-pay | Admitting: Cardiology

## 2022-02-27 ENCOUNTER — Encounter (HOSPITAL_COMMUNITY)
Admission: RE | Admit: 2022-02-27 | Discharge: 2022-02-27 | Disposition: A | Discharge status: Home / Self Care | Payer: BC Managed Care – PPO | Source: Ambulatory Visit | Attending: Internal Medicine | Admitting: Internal Medicine

## 2022-02-27 ENCOUNTER — Other Ambulatory Visit: Payer: Self-pay | Admitting: Cardiology

## 2022-02-27 ENCOUNTER — Encounter (HOSPITAL_COMMUNITY): Payer: BC Managed Care – PPO

## 2022-02-27 DIAGNOSIS — Z48812 Encounter for surgical aftercare following surgery on the circulatory system: Secondary | ICD-10-CM | POA: Diagnosis not present

## 2022-02-27 DIAGNOSIS — I214 Non-ST elevation (NSTEMI) myocardial infarction: Secondary | ICD-10-CM

## 2022-02-27 DIAGNOSIS — Z955 Presence of coronary angioplasty implant and graft: Secondary | ICD-10-CM | POA: Diagnosis not present

## 2022-02-27 DIAGNOSIS — I252 Old myocardial infarction: Secondary | ICD-10-CM | POA: Diagnosis not present

## 2022-03-01 ENCOUNTER — Encounter (HOSPITAL_COMMUNITY): Payer: BC Managed Care – PPO

## 2022-03-01 ENCOUNTER — Encounter (HOSPITAL_COMMUNITY)
Admission: RE | Admit: 2022-03-01 | Discharge: 2022-03-01 | Disposition: A | Discharge status: Home / Self Care | Payer: BC Managed Care – PPO | Source: Ambulatory Visit | Attending: Internal Medicine | Admitting: Internal Medicine

## 2022-03-01 DIAGNOSIS — G4733 Obstructive sleep apnea (adult) (pediatric): Secondary | ICD-10-CM | POA: Diagnosis not present

## 2022-03-01 DIAGNOSIS — Z48812 Encounter for surgical aftercare following surgery on the circulatory system: Secondary | ICD-10-CM | POA: Diagnosis not present

## 2022-03-01 DIAGNOSIS — I214 Non-ST elevation (NSTEMI) myocardial infarction: Secondary | ICD-10-CM

## 2022-03-01 DIAGNOSIS — I252 Old myocardial infarction: Secondary | ICD-10-CM | POA: Diagnosis not present

## 2022-03-01 DIAGNOSIS — Z955 Presence of coronary angioplasty implant and graft: Secondary | ICD-10-CM | POA: Diagnosis not present

## 2022-03-02 DIAGNOSIS — Z955 Presence of coronary angioplasty implant and graft: Secondary | ICD-10-CM | POA: Diagnosis not present

## 2022-03-02 DIAGNOSIS — I214 Non-ST elevation (NSTEMI) myocardial infarction: Secondary | ICD-10-CM

## 2022-03-02 DIAGNOSIS — I25110 Atherosclerotic heart disease of native coronary artery with unstable angina pectoris: Secondary | ICD-10-CM | POA: Diagnosis not present

## 2022-03-03 ENCOUNTER — Encounter (HOSPITAL_COMMUNITY): Payer: BC Managed Care – PPO

## 2022-03-03 ENCOUNTER — Encounter (HOSPITAL_COMMUNITY)
Admission: RE | Admit: 2022-03-03 | Discharge: 2022-03-03 | Disposition: A | Discharge status: Home / Self Care | Payer: BC Managed Care – PPO | Source: Ambulatory Visit | Attending: Internal Medicine | Admitting: Internal Medicine

## 2022-03-03 DIAGNOSIS — Z955 Presence of coronary angioplasty implant and graft: Secondary | ICD-10-CM

## 2022-03-03 DIAGNOSIS — I252 Old myocardial infarction: Secondary | ICD-10-CM | POA: Diagnosis not present

## 2022-03-03 DIAGNOSIS — I214 Non-ST elevation (NSTEMI) myocardial infarction: Secondary | ICD-10-CM

## 2022-03-03 DIAGNOSIS — Z48812 Encounter for surgical aftercare following surgery on the circulatory system: Secondary | ICD-10-CM | POA: Diagnosis not present

## 2022-03-06 ENCOUNTER — Encounter (HOSPITAL_COMMUNITY): Payer: BC Managed Care – PPO

## 2022-03-08 ENCOUNTER — Encounter (HOSPITAL_COMMUNITY)
Admission: RE | Admit: 2022-03-08 | Discharge: 2022-03-08 | Disposition: A | Discharge status: Home / Self Care | Payer: BC Managed Care – PPO | Source: Ambulatory Visit | Attending: Internal Medicine | Admitting: Internal Medicine

## 2022-03-08 DIAGNOSIS — I214 Non-ST elevation (NSTEMI) myocardial infarction: Secondary | ICD-10-CM | POA: Diagnosis not present

## 2022-03-08 DIAGNOSIS — Z955 Presence of coronary angioplasty implant and graft: Secondary | ICD-10-CM

## 2022-03-08 DIAGNOSIS — Z48812 Encounter for surgical aftercare following surgery on the circulatory system: Secondary | ICD-10-CM | POA: Diagnosis not present

## 2022-03-08 DIAGNOSIS — I252 Old myocardial infarction: Secondary | ICD-10-CM | POA: Diagnosis not present

## 2022-03-10 ENCOUNTER — Encounter (HOSPITAL_COMMUNITY): Payer: BC Managed Care – PPO

## 2022-03-13 ENCOUNTER — Encounter (HOSPITAL_COMMUNITY)
Admission: RE | Admit: 2022-03-13 | Discharge: 2022-03-13 | Disposition: A | Discharge status: Home / Self Care | Payer: BC Managed Care – PPO | Source: Ambulatory Visit | Attending: Internal Medicine | Admitting: Internal Medicine

## 2022-03-13 DIAGNOSIS — Z955 Presence of coronary angioplasty implant and graft: Secondary | ICD-10-CM | POA: Diagnosis not present

## 2022-03-13 DIAGNOSIS — I214 Non-ST elevation (NSTEMI) myocardial infarction: Secondary | ICD-10-CM

## 2022-03-13 DIAGNOSIS — Z48812 Encounter for surgical aftercare following surgery on the circulatory system: Secondary | ICD-10-CM | POA: Diagnosis not present

## 2022-03-13 DIAGNOSIS — I252 Old myocardial infarction: Secondary | ICD-10-CM | POA: Diagnosis not present

## 2022-03-14 NOTE — Progress Notes (Signed)
Cardiac Individual Treatment Plan  Patient Details  Name: Alex Arias MRN: [id number] Date of Birth: 04-27-1955 Referring Provider:   Flowsheet Row INTENSIVE CARDIAC REHAB ORIENT from 01/05/2022 in [facility] for Heart, Vascular, & [place]  Referring Provider Glenetta Hew, MD       Initial Encounter Date:  [place] from 01/05/2022 in [facility] for Heart, Vascular, & Lung Health  Date 01/05/22       Visit Diagnosis: 10/30/21 NSTEMI  10/30/21 S/P DES LAD  Patient's Home Medications on Admission:  Current Outpatient Medications:    apixaban (ELIQUIS) 5 MG TABS tablet, Take 1 tablet (5 mg total) by mouth 2 (two) times daily., Disp: 60 tablet, Rfl: 3   atorvastatin (LIPITOR) 40 MG tablet, Take 1 tablet (40 mg total) by mouth daily., Disp: 90 tablet, Rfl: 0   Cholecalciferol (VITAMIN D3 PO), Take 1 capsule by mouth every evening., Disp: , Rfl:    clopidogrel (PLAVIX) 75 MG tablet, Take 1 tablet (75 mg total) by mouth daily., Disp: 90 tablet, Rfl: 2   empagliflozin (JARDIANCE) 10 MG TABS tablet, TAKE 1 TABLET BY MOUTH EVERY DAY, Disp: 90 tablet, Rfl: 3   KLOR-CON M20 20 MEQ tablet, TAKE 1 TABLET BY MOUTH EVERY DAY, Disp: 90 tablet, Rfl: 1   levothyroxine (SYNTHROID) 88 MCG tablet, Take 88 mcg by mouth daily before breakfast., Disp: , Rfl:    Multiple Vitamin (MULITIVITAMIN WITH MINERALS) TABS, Take 1 tablet by mouth every evening., Disp: , Rfl:    nitroGLYCERIN (NITROSTAT) 0.4 MG SL tablet, Place 1 tablet (0.4 mg total) under the tongue every 5 (five) minutes as needed for chest pain., Disp: 25 tablet, Rfl: 2   pantoprazole (PROTONIX) 40 MG tablet, Take 40 mg by mouth in the morning., Disp: , Rfl:    sacubitril-valsartan (ENTRESTO) 24-26 MG, Take 1 tablet by mouth 2 (two) times daily., Disp: 60 tablet, Rfl: 11   spironolactone (ALDACTONE) 25 MG tablet, Take 1 tablet (25 mg total) by mouth daily.,  Disp: 90 tablet, Rfl: 3   Study - EVOLVE-MI - evolocumab (REPATHA) 140 mg/mL SQ injection (PI-[place]uckey), Inject 140 mg into the skin every 14 (fourteen) days. For Investigational Use Only. Inject subcutaneously into abdomen, thigh, or upper arm every 14 days. Rotate injection sites and do not inject into areas where skin is tender, bruised, or red. Please contact [place] for any questions or concerns regarding this medication., Disp: , Rfl:    tadalafil (CIALIS) 5 MG tablet, Take 5 mg by mouth every evening., Disp: , Rfl:   Past Medical History: Past Medical History:  Diagnosis Date   Abdominal distension    Abdominal pain    Arrhythmia    Coronary artery disease involving native coronary artery of native heart with unstable angina pectoris ([place]) 10/30/2021   Cath-PCI 10/30/2021: 99% thrombotic SubTO of mLAD just after D2 -> DES PCI 3.0 x 16 Synergy XD => deployed to 3.0 mm.  2 big Diags, LCx-OM1 & Large Dominant RCA - rPDA & PAV-RPL1-3 free of disease. EF ~40-45%, LVEDP 21--25 mmHg mid-apical anterior & apical HK.   Cough    Family history of diabetes mellitus    Itching    NSTEMI (non-ST elevated myocardial infarction) ([place]) - + Troponin with Anterior & Inferior ST depressions => ongoing CP => Urgent Cath 10/30/2021   Cath with 99% subTO of LAD => DES PCI   Pleurisy    Presence of  drug coated stent in LAD coronary artery 10/30/2021   mLAD 99% thrombotic lesion: DES PCI w/ Synergy XD 3 x 16 => deployed ~3-3.1 mm   Right inguinal hernia    Unspecified viral infection, in conditions classified elsewhere and of unspecified site    viral syndrome - per notes from [facility] dated 01/03/11    Tobacco Use: Social History   Tobacco Use  Smoking Status Never  Smokeless Tobacco Never    Labs: Review Flowsheet       Latest Ref Rng & Units 04/02/2014 07/24/2014 10/31/2021 01/27/2022  Labs for ITP Cardiac and Pulmonary Rehab  Cholestrol 100 - 199 mg/dL - - 122  87   LDL  (calc) 0 - 99 mg/dL - - 57  17   HDL-C >39 mg/dL - - 44  53   Trlycerides 0 - 149 mg/dL - - 106  83   Hemoglobin A1c 4.8 - 5.6 % - - 5.1  -  TCO2 0 - 100 mmol/L 19  27  - -    Capillary Blood Glucose: No results found for: "GLUCAP"   Exercise Target Goals: Exercise Program Goal: Individual exercise prescription set using results from initial 6 min walk test and THRR while considering  patient's activity barriers and safety.   Exercise Prescription Goal: Initial exercise prescription builds to 30-45 minutes a day of aerobic activity, 2-3 days per week.  Home exercise guidelines will be given to patient during program as part of exercise prescription that the participant will acknowledge.  Activity Barriers & Risk Stratification:  Activity Barriers & Cardiac Risk Stratification - 01/05/22 1443       Activity Barriers & Cardiac Risk Stratification   Activity Barriers Decreased Ventricular Function    Cardiac Risk Stratification High   <5 METs on 6MWT            6 Minute Walk:  6 Minute Walk     Row Name 01/05/22 1442         6 Minute Walk   Phase Initial     Distance 1628 feet     Walk Time 6 minutes     # of Rest Breaks 0     MPH 3.08     METS 3.41     RPE 11     Perceived Dyspnea  0     VO2 Peak 11.94     Symptoms No     Resting HR 47 bpm     Resting BP 116/78     Resting Oxygen Saturation  99 %     Exercise Oxygen Saturation  during 6 min walk 100 %     Max Ex. HR 76 bpm     Max Ex. BP 122/80     2 Minute Post BP 116/76              Oxygen Initial Assessment:   Oxygen Re-Evaluation:   Oxygen Discharge (Final Oxygen Re-Evaluation):   Initial Exercise Prescription:  Initial Exercise Prescription - 01/05/22 1400       Date of Initial Exercise RX and Referring Provider   Date 01/05/22    Referring Provider Glenetta Hew, MD    Expected Discharge Date 03/03/22      NuStep   Level 3    SPM 75    Minutes 15    METs 3      Elliptical    Level 1    Minutes 15    METs 3  Prescription Details   Frequency (times per week) 3    Duration Progress to 30 minutes of continuous aerobic without signs/symptoms of physical distress      Intensity   THRR 40-80% of Max Heartrate 60-121    Ratings of Perceived Exertion 11-13    Perceived Dyspnea 0-4      Progression   Progression Continue progressive overload as per policy without signs/symptoms or physical distress.      Resistance Training   Training Prescription Yes    Weight 4.    Reps 10-15             Perform Capillary Blood Glucose checks as needed.  Exercise Prescription Changes:   Exercise Prescription Changes     Row Name 01/27/22 1613 02/17/22 1708 03/03/22 1643         Response to Exercise   Blood Pressure (Admit) 112/68 104/64 98/62     Blood Pressure (Exercise) 128/78 136/72 122/66     Blood Pressure (Exit) 118/70 115/70 110/70     Heart Rate (Admit) 56 bpm 57 bpm 69 bpm     Heart Rate (Exercise) 112 bpm 105 bpm 113 bpm     Heart Rate (Exit) 65 bpm 64 bpm 72 bpm     Rating of Perceived Exertion (Exercise) 11 11.5 12     Perceived Dyspnea (Exercise) -- 0 0     Symptoms none none none     Comments Pt first day in the CRP2 program Reviewed MET's. goals and home ExRx Reviewed MET's     Duration Progress to 30 minutes of  aerobic without signs/symptoms of physical distress Progress to 30 minutes of  aerobic without signs/symptoms of physical distress Progress to 30 minutes of  aerobic without signs/symptoms of physical distress     Intensity THRR unchanged THRR unchanged THRR unchanged       Progression   Progression Continue to progress workloads to maintain intensity without signs/symptoms of physical distress. Continue to progress workloads to maintain intensity without signs/symptoms of physical distress. Continue to progress workloads to maintain intensity without signs/symptoms of physical distress.     Average METs 3.55 4.3 4.5        Resistance Training   Training Prescription Yes Yes Yes     Weight 4 lbs wts 4 lbs wts 4 lbs wts     Reps 10-15 10-15 10-15     Time 10 Minutes 10 Minutes 10 Minutes       NuStep   Level '4 5 5     '$ SPM 85 88 88     Minutes '15 15 15     '$ METs 2.6 3.2 3.6       Elliptical   Level '1 2 2     '$ Speed -- -- 1     Minutes '15 15 15     '$ METs 4.5 5.6 5.4       Home Exercise Plan   Plans to continue exercise at -- Home (comment) Home (comment)     Frequency -- Add 2 additional days to program exercise sessions. Add 2 additional days to program exercise sessions.     Initial Home Exercises Provided -- 02/17/22 02/17/22              Exercise Comments:   Exercise Comments     Row Name 01/27/22 1619 02/17/22 1714 03/03/22 1645       Exercise Comments Pt first day in the CRP2 program. Pt tolerated exercise well with anaverage MET  level of 3.55. Pt is learning his THRR, RPE and ExRx. Reviewed MET's, goals and home ExRx. Pt tolerated exercise well with anaverage MET level of 4.3. Pt is showing good progression and feels good with his progress so far by increasing his strength, stamina and adjustments to home ExRx. Pt is continueing to work with RD for diet modifications but feels good with changes he has made so far. Pt will continue to exercise on his own by walking and using his home elliptical 2 days for 30-45 mins per session. Reviewed MET's today. Pt tolerated exercise well with average MET level of 4.5. Pt is doing well and increasing MET's, pt is also interested in trying different equipment which we will try on his next visit              Exercise Goals and Review:   Exercise Goals     Row Name 01/05/22 1452             Exercise Goals   Increase Physical Activity Yes       Intervention Provide advice, education, support and counseling about physical activity/exercise needs.;Develop an individualized exercise prescription for aerobic and resistive training based on initial  evaluation findings, risk stratification, comorbidities and participant's personal goals.       Expected Outcomes Short Term: Attend rehab on a regular basis to increase amount of physical activity.;Long Term: Exercising regularly at least 3-5 days a week.;Long Term: Add in home exercise to make exercise part of routine and to increase amount of physical activity.       Increase Strength and Stamina Yes       Intervention Provide advice, education, support and counseling about physical activity/exercise needs.;Develop an individualized exercise prescription for aerobic and resistive training based on initial evaluation findings, risk stratification, comorbidities and participant's personal goals.       Expected Outcomes Short Term: Increase workloads from initial exercise prescription for resistance, speed, and METs.;Short Term: Perform resistance training exercises routinely during rehab and add in resistance training at home;Long Term: Improve cardiorespiratory fitness, muscular endurance and strength as measured by increased METs and functional capacity (6MWT)       Able to understand and use rate of perceived exertion (RPE) scale Yes       Intervention Provide education and explanation on how to use RPE scale       Expected Outcomes Short Term: Able to use RPE daily in rehab to express subjective intensity level;Long Term:  Able to use RPE to guide intensity level when exercising independently       Knowledge and understanding of Target Heart Rate Range (THRR) Yes       Intervention Provide education and explanation of THRR including how the numbers were predicted and where they are located for reference       Expected Outcomes Short Term: Able to state/look up THRR;Short Term: Able to use daily as guideline for intensity in rehab;Long Term: Able to use THRR to govern intensity when exercising independently       Understanding of Exercise Prescription Yes       Intervention Provide education,  explanation, and written materials on patient's individual exercise prescription       Expected Outcomes Short Term: Able to explain program exercise prescription;Long Term: Able to explain home exercise prescription to exercise independently                Exercise Goals Re-Evaluation :  Exercise Goals Re-Evaluation     Row  Name 01/27/22 1617 02/17/22 1710           Exercise Goal Re-Evaluation   Exercise Goals Review Increase Physical Activity;Understanding of Exercise Prescription;Increase Strength and Stamina;Knowledge and understanding of Target Heart Rate Range (THRR);Able to understand and use rate of perceived exertion (RPE) scale Increase Physical Activity;Understanding of Exercise Prescription;Increase Strength and Stamina;Knowledge and understanding of Target Heart Rate Range (THRR);Able to understand and use rate of perceived exertion (RPE) scale      Comments Pt first day in the CRP2 program. Pt tolerated exercise well with anaverage MET level of 3.55. Pt is learning his THRR, RPE and ExRx. Reviewed MET's, goals and home ExRx. Pt tolerated exercise well with anaverage MET level of 4.3. Pt is showing good progression and feels good with his progress so far by increasing his strength, stamina and adjustments to home ExRx. Pt is continueing to work with RD for diet modifications but feels good with changes he has made so far. Pt will continue to exercise on his own by walking and using his home elliptical 2 days for 30-45 mins per session.      Expected Outcomes Will continue to monitor pt and progress workloads as tolerated without sign or symptom. Pt will continue to exercise on his own and gain strength. Will continue to monitor pt and progress workloads as tolerated without sign or symptom.               Discharge Exercise Prescription (Final Exercise Prescription Changes):  Exercise Prescription Changes - 03/03/22 1643       Response to Exercise   Blood Pressure (Admit)  98/62    Blood Pressure (Exercise) 122/66    Blood Pressure (Exit) 110/70    Heart Rate (Admit) 69 bpm    Heart Rate (Exercise) 113 bpm    Heart Rate (Exit) 72 bpm    Rating of Perceived Exertion (Exercise) 12    Perceived Dyspnea (Exercise) 0    Symptoms none    Comments Reviewed MET's    Duration Progress to 30 minutes of  aerobic without signs/symptoms of physical distress    Intensity THRR unchanged      Progression   Progression Continue to progress workloads to maintain intensity without signs/symptoms of physical distress.    Average METs 4.5      Resistance Training   Training Prescription Yes    Weight 4 lbs wts    Reps 10-15    Time 10 Minutes      NuStep   Level 5    SPM 88    Minutes 15    METs 3.6      Elliptical   Level 2    Speed 1    Minutes 15    METs 5.4      Home Exercise Plan   Plans to continue exercise at Home (comment)    Frequency Add 2 additional days to program exercise sessions.    Initial Home Exercises Provided 02/17/22             Nutrition:  Target Goals: Understanding of nutrition guidelines, daily intake of sodium '1500mg'$ , cholesterol '200mg'$ , calories 30% from fat and 7% or less from saturated fats, daily to have 5 or more servings of fruits and vegetables.  Biometrics:  Pre Biometrics - 01/05/22 1439       Pre Biometrics   Waist Circumference 43 inches    Hip Circumference 43.5 inches    Waist to Hip Ratio 0.99 %  Triceps Skinfold 13 mm    % Body Fat 27.7 %    Grip Strength 42 kg    Flexibility 15.75 in    Single Leg Stand 20.93 seconds              Nutrition Therapy Plan and Nutrition Goals:  Nutrition Therapy & Goals - 02/27/22 1533       Nutrition Therapy   Diet Heart Healthy Diet    Drug/Food Interactions Statins/Certain Fruits      Personal Nutrition Goals   Nutrition Goal Patient to identify strategies for reducing cardiovascular risk by attending the [facility] education and nutrition series weekly     Personal Goal #2 Patient to improve diet quality by using the plate method as a guide for meal planning to include lean protein/plant protein, fruits, vegetables, whole grains, and nonfat dairy as part of a balanced diet    Personal Goal #3 Patient to reduce sodium to '1500mg'$  per day    Comments Goals in progress. He continues to attend the [facility] education and nutrition series regularly.  Ed reports increased exercise/physical activity, decreased meal skipping, and reduced portion sizes. He reports reduced sodium intake and reading food labels for sodium. He will continue to benefit from attending Intensive Cardiac Rehab for nutrition, exercise and lifestyle modification.      Intervention Plan   Intervention Prescribe, educate and counsel regarding individualized specific dietary modifications aiming towards targeted core components such as weight, hypertension, lipid management, diabetes, heart failure and other comorbidities.;Nutrition handout(s) given to patient.    Expected Outcomes Short Term Goal: Understand basic principles of dietary content, such as calories, fat, sodium, cholesterol and nutrients.;Long Term Goal: Adherence to prescribed nutrition plan.             Nutrition Assessments:  Nutrition Assessments - 01/05/22 1320       Rate Your Plate Scores   Pre Score 54            MEDIFICTS Score Key: ?70 Need to make dietary changes  40-70 Heart Healthy Diet ? 40 Therapeutic Level Cholesterol Diet   Flowsheet Row INTENSIVE CARDIAC REHAB ORIENT from 01/05/2022 in [facility] for Heart, Vascular, & Lung Health  Picture Your Plate Total Score on Admission 54      Picture Your Plate Scores: [id number] Unhealthy dietary pattern with much room for improvement. 41-50 Dietary pattern unlikely to meet recommendations for good health and room for improvement. 51-60 More healthful dietary pattern, with some room for improvement.  >60 Healthy dietary  pattern, although there may be some specific behaviors that could be improved.    Nutrition Goals Re-Evaluation:  Nutrition Goals Re-Evaluation     [place] Name 01/27/22 1540 02/27/22 1533           Goals   Current Weight -- 219 lb 9.3 oz (99.6 kg)      Comment lipids at goal WNL, A1c WNL, lipoprotein A 115.1 no new labs; most recent labs lipids at goal WNL, A1c WNL, lipoprotein A 115.1; he is down 4.6# since starting with our program (start weight 101.7kg)      Expected Outcome Patient reports normal appetite and eating two meals daily, skipping lunch. 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 Lipids are at goal and A1c WNL. His wife is very supportive of making lifestyle changes; she is a Marine scientist. He will continue to benefit from attending Intensive Cardiac Rehab for nutrition, exercise and lifestyle modification. Goals in progress. He continues to attend the [facility]  education and nutrition series regularly. Ed reports increased exercise/physical activity, decreased meal skipping, and reduced portion sizes. He reports reduced sodium intake and reading food labels for sodium. He will continue to benefit from attending Intensive Cardiac Rehab for nutrition, exercise and lifestyle modification.               Nutrition Goals Re-Evaluation:  Nutrition Goals Re-Evaluation     [place] Name 01/27/22 1540 02/27/22 1533           Goals   Current Weight -- 219 lb 9.3 oz (99.6 kg)      Comment lipids at goal WNL, A1c WNL, lipoprotein A 115.1 no new labs; most recent labs lipids at goal WNL, A1c WNL, lipoprotein A 115.1; he is down 4.6# since starting with our program (start weight 101.7kg)      Expected Outcome Patient reports normal appetite and eating two meals daily, skipping lunch. 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 Lipids are at goal and A1c WNL. His wife is very supportive of making lifestyle changes; she is a Marine scientist. He will continue to benefit from attending Intensive Cardiac Rehab for nutrition,  exercise and lifestyle modification. Goals in progress. He continues to attend the [facility] education and nutrition series regularly. Ed reports increased exercise/physical activity, decreased meal skipping, and reduced portion sizes. He reports reduced sodium intake and reading food labels for sodium. He will continue to benefit from attending Intensive Cardiac Rehab for nutrition, exercise and lifestyle modification.               Nutrition Goals Discharge (Final Nutrition Goals Re-Evaluation):  Nutrition Goals Re-Evaluation - 02/27/22 1533       Goals   Current Weight 219 lb 9.3 oz (99.6 kg)    Comment no new labs; most recent labs lipids at goal WNL, A1c WNL, lipoprotein A 115.1; he is down 4.6# since starting with our program (start weight 101.7kg)    Expected Outcome Goals in progress. He continues to attend the [facility] education and nutrition series regularly. Ed reports increased exercise/physical activity, decreased meal skipping, and reduced portion sizes. He reports reduced sodium intake and reading food labels for sodium. He will continue to benefit from attending Intensive Cardiac Rehab for nutrition, exercise and lifestyle modification.             Psychosocial: Target Goals: Acknowledge presence or absence of significant depression and/or stress, maximize coping skills, provide positive support system. Participant is able to verbalize types and ability to use techniques and skills needed for reducing stress and depression.  Initial Review & Psychosocial Screening:  Initial Psych Review & Screening - 01/05/22 1538       Initial Review   Current issues with None Identified      Family Dynamics   Good Support System? Yes   Ed has his wife and two daughter for support     Barriers   Psychosocial barriers to participate in program There are no identifiable barriers or psychosocial needs.      Screening Interventions   Interventions Encouraged to exercise              Quality of Life Scores:  Quality of Life - 01/05/22 1453       Quality of Life   Select Quality of Life      Quality of Life Scores   Health/Function Pre 26.73 %    Socioeconomic Pre 25.42 %    Psych/Spiritual Pre 27.71 %    Family Pre 26.4 %  GLOBAL Pre 26.65 %            Scores of 19 and below usually indicate a poorer quality of life in these areas.  A difference of  2-3 points is a clinically meaningful difference.  A difference of 2-3 points in the total score of the Quality of Life Index has been associated with significant improvement in overall quality of life, self-image, physical symptoms, and general health in studies assessing change in quality of life.  PHQ-9: Review Flowsheet       01/05/2022  Depression screen PHQ 2/9  Decreased Interest 0  Down, Depressed, Hopeless 0  PHQ - 2 Score 0   Interpretation of Total Score  Total Score Depression Severity:  1-4 = Minimal depression, 5-9 = Mild depression, 10-14 = Moderate depression, 15-19 = Moderately severe depression, 20-27 = Severe depression   Psychosocial Evaluation and Intervention:   Psychosocial Re-Evaluation:  Psychosocial Re-Evaluation     [place] Name 01/27/22 1539 02/15/22 1308 03/14/22 1102         Psychosocial Re-Evaluation   Current issues with None Identified None Identified None Identified     Interventions Encouraged to attend Cardiac Rehabilitation for the exercise Encouraged to attend Cardiac Rehabilitation for the exercise Encouraged to attend Cardiac Rehabilitation for the exercise     Continue Psychosocial Services  No Follow up required No Follow up required No Follow up required              Psychosocial Discharge (Final Psychosocial Re-Evaluation):  Psychosocial Re-Evaluation - 03/14/22 1102       Psychosocial Re-Evaluation   Current issues with None Identified    Interventions Encouraged to attend Cardiac Rehabilitation for the exercise    Continue  Psychosocial Services  No Follow up required             Vocational Rehabilitation: Provide vocational rehab assistance to qualifying candidates.   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 Participant will state understanding/return demonstration of topics presented.    Education     Row Name 01/27/22 1400     Education   Cardiac Education Topics [facility]   Charity fundraiser Exercise Physiologist   Select Psychosocial   Psychosocial Healthy Minds, Bodies, Hearts   Instruction Review Code 1- Verbalizes Understanding   Class Start Time 1358   Class Stop Time 1432   Class Time Calculation (min) 34 min    [place] Name 01/30/22 1600     Education   Cardiac Education Topics [facility]   IT sales professional Nutrition   Nutrition Workshop Label Reading   Instruction Review Code 1- Verbalizes Understanding   Class Start Time 1400   Class Stop Time 1458   Class Time Calculation (min) 58 min    [place] Name 02/02/22 1000     Education   Cardiac Education Topics Pritikin   [employer]  completed 02/01/22     [employer]ing [employer]   Educator Dietitian   Weekly Topic [place]   Instruction  Review Code 1- Verbalizes Understanding   Class Start Time 1400   Class Stop Time 1458   Class Time Calculation (min) 58 min    Row Name 02/06/22 1500     Education   Cardiac Education Topics Pritikin   Financial planner Exercise Physiologist   Select Psychosocial   Psychosocial Workshop Other  Focused Goals, Sustainable changes   Instruction  Review Code 1- Verbalizes Understanding   Class Start Time 1402   Class Stop Time 1445   Class Time Calculation (min) 43 min    [place] Name 02/08/22 1600     Education   Cardiac Education Topics Pritikin   Financial trader   Weekly Topic Tasty Appetizers and Snacks   Instruction Review Code 1- Verbalizes Understanding   Class Start Time 1402   Class Stop Time 1448   Class Time Calculation (min) 46 min    [place] Name 02/13/22 1500     Education   Cardiac Education Topics [place]   Select Workshops     Workshops   Educator Exercise Physiologist   Select Exercise   Exercise Workshop Exercise Basics: Building Your Action Plan   Instruction Review Code 1- Verbalizes Understanding   Class Start Time 1359   Class Stop Time 1445   Class Time Calculation (min) 46 min    [place] Name 02/17/22 1400     Education   Cardiac Education Topics Pritikin   Academic librarian Exercise Education   Exercise Education Move It!   Instruction Review Code 1- Verbalizes Understanding   Class Start Time 1405   Class Stop Time 1442   Class Time Calculation (min) 37 min    Row Name 02/27/22 1500     Education   Cardiac Education Topics Pritikin   Environmental consultant Psychosocial   Psychosocial Workshop Healthy Sleep for a Healthy Heart   Instruction Review Code 1- Verbalizes Understanding   Class Start Time 1400   Class Stop Time 1450   Class Time Calculation (min) 50 min    [place] Name 03/01/22 1500     Education   Cardiac Education Topics Pritikin   Financial trader   Weekly Topic Comforting Weekend Breakfasts   Instruction Review Code 1- Verbalizes Understanding   Class Start Time 1400   Class Stop Time 1445   Class Time Calculation (min) 45 min    [place] Name 03/03/22 1500      Education   Cardiac Education Topics Pritikin   Lexicographer Nutrition   Nutrition Dining Out - Part 1   Instruction Review Code 1- Verbalizes Understanding   Class Start Time 1405   Class Stop Time 1440   Class Time Calculation (min) 35 min    [place] Name 03/08/22 1600     Education   Cardiac Education Topics Pritikin   Financial trader   Weekly Topic Fast Evening Meals   Instruction Review Code 1- Verbalizes Understanding   Class Start Time 1400   Class Stop Time 1445   Class Time Calculation (min) 45 min  [place] Name 03/13/22 1500     Education   Cardiac Education Topics Pritikin   Financial trader   Weekly Topic [employer]- Spotlight on the [facility] Zones   Instruction Review Code 1- Verbalizes Understanding   Class Start Time 1405   Class Stop Time 1453   Class Time Calculation (min) 48 min            Core Videos: Exercise    Move It!  Clinical staff conducted group or individual video education with verbal and written material and guidebook.  Patient learns the recommended Pritikin exercise program. Exercise with the goal of living a long, healthy life. Some of the health benefits of exercise include controlled diabetes, healthier blood pressure levels, improved cholesterol levels, improved heart and lung capacity, improved sleep, and better body composition. Everyone should speak with their doctor before starting or changing an exercise routine.  Biomechanical Limitations Clinical staff conducted group or individual video education with verbal and written material and guidebook.  Patient learns how biomechanical limitations can impact exercise and how we can mitigate and possibly overcome limitations to have an impactful and balanced exercise routine.  Body Composition Clinical staff conducted group or  individual video education with verbal and written material and guidebook.  Patient learns that body composition (ratio of muscle mass to fat mass) is a key component to assessing overall fitness, rather than body weight alone. Increased fat mass, especially visceral belly fat, can put [place] at increased risk for metabolic syndrome, type 2 diabetes, heart disease, and even death. It is recommended to combine diet and exercise (cardiovascular and resistance training) to improve your body composition. Seek guidance from your physician and exercise physiologist before implementing an exercise routine.  Exercise Action Plan Clinical staff conducted group or individual video education with verbal and written material and guidebook.  Patient learns the recommended strategies to achieve and enjoy long-term exercise adherence, including variety, self-motivation, self-efficacy, and positive decision making. Benefits of exercise include fitness, good health, weight management, more energy, better sleep, less stress, and overall well-being.  Medical   Heart Disease Risk Reduction Clinical staff conducted group or individual video education with verbal and written material and guidebook.  Patient learns our heart is our most vital organ as it circulates oxygen, nutrients, white blood cells, and hormones throughout the entire body, and carries waste away. Data supports a plant-based eating plan like the Pritikin Program for its effectiveness in slowing progression of and reversing heart disease. The video provides a number of recommendations to address heart disease.   Metabolic Syndrome and Belly Fat  Clinical staff conducted group or individual video education with verbal and written material and guidebook.  Patient learns what metabolic syndrome is, how it leads to heart disease, and how one can reverse it and keep it from coming back. You have metabolic syndrome if you have 3 of the following 5 criteria: abdominal  obesity, high blood pressure, high triglycerides, low HDL cholesterol, and high blood sugar.  Hypertension and Heart Disease Clinical staff conducted group or individual video education with verbal and written material and guidebook.  Patient learns that high blood pressure, or hypertension, is very common in the [place]. Hypertension is largely due to excessive salt intake, but other important risk factors include being overweight, physical inactivity, drinking too much alcohol, smoking, and not eating enough potassium from fruits and vegetables. High blood pressure is a leading risk factor for  heart attack, stroke, congestive heart failure, dementia, kidney failure, and premature death. Long-term effects of excessive salt intake include stiffening of the arteries and thickening of heart muscle and organ damage. Recommendations include ways to reduce hypertension and the risk of heart disease.  Diseases of Our Time - Focusing on Diabetes Clinical staff conducted group or individual video education with verbal and written material and guidebook.  Patient learns why the best way to stop diseases of our time is prevention, through food and other lifestyle changes. Medicine (such as prescription pills and surgeries) is often only a Band-Aid on the problem, not a long-term solution. Most common diseases of our time include obesity, type 2 diabetes, hypertension, heart disease, and cancer. The Pritikin Program is recommended and has been proven to help reduce, reverse, and/or prevent the damaging effects of metabolic syndrome.  Nutrition   Overview of the Pritikin Eating Plan  Clinical staff conducted group or individual video education with verbal and written material and guidebook.  Patient learns about the [place] for disease risk reduction. The [place] emphasizes a wide variety of unrefined, minimally-processed carbohydrates, like fruits, vegetables, whole grains, and  legumes. Go, Caution, and Stop food choices are explained. Plant-based and lean animal proteins are emphasized. Rationale provided for low sodium intake for blood pressure control, low added sugars for blood sugar stabilization, and low added fats and oils for coronary artery disease risk reduction and weight management.  Calorie Density  Clinical staff conducted group or individual video education with verbal and written material and guidebook.  Patient learns about calorie density and how it impacts the Pritikin Eating Plan. Knowing the characteristics of the food you choose will help you decide whether those foods will lead to weight gain or weight loss, and whether you want to consume more or less of them. Weight loss is usually a side effect of the Pritikin Eating Plan because of its focus on low calorie-dense foods.  Label Reading  Clinical staff conducted group or individual video education with verbal and written material and guidebook.  Patient learns about the Pritikin recommended label reading guidelines and corresponding recommendations regarding calorie density, added sugars, sodium content, and whole grains.  Dining Out - Part 1  Clinical staff conducted group or individual video education with verbal and written material and guidebook.  Patient learns that restaurant meals can be sabotaging because they can be so high in calories, fat, sodium, and/or sugar. Patient learns recommended strategies on how to positively address this and avoid unhealthy pitfalls.  Facts on Fats  Clinical staff conducted group or individual video education with verbal and written material and guidebook.  Patient learns that lifestyle modifications can be just as effective, if not more so, as many medications for lowering your risk of heart disease. A Pritikin lifestyle can help to reduce your risk of inflammation and atherosclerosis (cholesterol build-up, or plaque, in the artery walls). Lifestyle  interventions such as dietary choices and physical activity address the cause of atherosclerosis. A review of the types of fats and their impact on blood cholesterol levels, along with dietary recommendations to reduce fat intake is also included.  Nutrition Action Plan  Clinical staff conducted group or individual video education with verbal and written material and guidebook.  Patient learns how to incorporate Pritikin recommendations into their lifestyle. Recommendations include planning and keeping personal health goals in mind as an important part of their success.  Healthy Mind-Set    Healthy Minds, Bodies, Hearts  Clinical staff conducted group or individual video education with verbal and written material and guidebook.  Patient learns how to identify when they are stressed. Video will discuss the impact of that stress, as well as the many benefits of stress management. Patient will also be introduced to stress management techniques. The way we think, act, and feel has an impact on our hearts.  How Our Thoughts Can Heal Our Hearts  Clinical staff conducted group or individual video education with verbal and written material and guidebook.  Patient learns that negative thoughts can cause depression and anxiety. This can result in negative lifestyle behavior and serious health problems. Cognitive behavioral therapy is an effective method to help control our thoughts in order to change and improve our emotional outlook.  Additional Videos:  Exercise    Improving Performance  Clinical staff conducted group or individual video education with verbal and written material and guidebook.  Patient learns to use a non-linear approach by alternating intensity levels and lengths of time spent exercising to help burn more calories and lose more body fat. Cardiovascular exercise helps improve heart health, metabolism, hormonal balance, blood sugar control, and recovery from fatigue. Resistance training  improves strength, endurance, balance, coordination, reaction time, metabolism, and muscle mass. Flexibility exercise improves circulation, posture, and balance. Seek guidance from your physician and exercise physiologist before implementing an exercise routine and learn your capabilities and proper form for all exercise.  Introduction to Yoga  Clinical staff conducted group or individual video education with verbal and written material and guidebook.  Patient learns about yoga, a discipline of the coming together of mind, breath, and body. The benefits of yoga include improved flexibility, improved range of motion, better posture and core strength, increased lung function, weight loss, and positive self-image. Yoga's heart health benefits include lowered blood pressure, healthier heart rate, decreased cholesterol and triglyceride levels, improved immune function, and reduced stress. Seek guidance from your physician and exercise physiologist before implementing an exercise routine and learn your capabilities and proper form for all exercise.  Medical   Aging: Enhancing Your Quality of Life  Clinical staff conducted group or individual video education with verbal and written material and guidebook.  Patient learns key strategies and recommendations to stay in good physical health and enhance quality of life, such as prevention strategies, having an advocate, securing a [place], and keeping a list of medications and system for tracking them. It also discusses how to avoid risk for bone loss.  Biology of Weight Control  Clinical staff conducted group or individual video education with verbal and written material and guidebook.  Patient learns that weight gain occurs because we consume more calories than we burn (eating more, moving less). Even if your body weight is normal, you may have higher ratios of fat compared to muscle mass. Too much body fat puts you at increased  risk for cardiovascular disease, heart attack, stroke, type 2 diabetes, and obesity-related cancers. In addition to exercise, following the [place] can help reduce your risk.  Decoding Lab Results  Clinical staff conducted group or individual video education with verbal and written material and guidebook.  Patient learns that lab test reflects one measurement whose values change over time and are influenced by many factors, including medication, stress, sleep, exercise, food, hydration, pre-existing medical conditions, and more. It is recommended to use the knowledge from this video to become more involved with your lab results and evaluate your numbers to  speak with your doctor.   Diseases of Our Time - Overview  Clinical staff conducted group or individual video education with verbal and written material and guidebook.  Patient learns that according to the CDC, 50% to 70% of chronic diseases (such as obesity, type 2 diabetes, elevated lipids, hypertension, and heart disease) are avoidable through lifestyle improvements including healthier food choices, listening to satiety cues, and increased physical activity.  Sleep Disorders Clinical staff conducted group or individual video education with verbal and written material and guidebook.  Patient learns how good quality and duration of sleep are important to overall health and well-being. Patient also learns about sleep disorders and how they impact health along with recommendations to address them, including discussing with a physician.  Nutrition  Dining Out - Part 2 Clinical staff conducted group or individual video education with verbal and written material and guidebook.  Patient learns how to plan ahead and communicate in order to maximize their dining experience in a healthy and nutritious manner. Included are recommended food choices based on the type of restaurant the patient is visiting.   Fueling a Development worker, international aid conducted group or individual video education with verbal and written material and guidebook.  There is a strong connection between our food choices and our health. Diseases like obesity and type 2 diabetes are very prevalent and are in large-part due to lifestyle choices. The Pritikin Eating Plan provides plenty of food and hunger-curbing satisfaction. It is easy to follow, affordable, and helps reduce health risks.  Menu Workshop  Clinical staff conducted group or individual video education with verbal and written material and guidebook.  Patient learns that restaurant meals can sabotage health goals because they are often packed with calories, fat, sodium, and sugar. Recommendations include strategies to plan ahead and to communicate with the manager, chef, or server to help order a healthier meal.  Planning Your Eating Strategy  Clinical staff conducted group or individual video education with verbal and written material and guidebook.  Patient learns about the [place] and its benefit of reducing the risk of disease. The [place] does not focus on calories. Instead, it emphasizes high-quality, nutrient-rich foods. By knowing the characteristics of the foods, we choose, we can determine their calorie density and make informed decisions.  Targeting Your Nutrition Priorities  Clinical staff conducted group or individual video education with verbal and written material and guidebook.  Patient learns that lifestyle habits have a tremendous impact on disease risk and progression. This video provides eating and physical activity recommendations based on your personal health goals, such as reducing LDL cholesterol, losing weight, preventing or controlling type 2 diabetes, and reducing high blood pressure.  Vitamins and Minerals  Clinical staff conducted group or individual video education with verbal and written material and guidebook.  Patient learns different ways to  obtain key vitamins and minerals, including through a recommended healthy diet. It is important to discuss all supplements you take with your doctor.   Healthy Mind-Set    Smoking Cessation  Clinical staff conducted group or individual video education with verbal and written material and guidebook.  Patient learns that cigarette smoking and tobacco addiction pose a serious health risk which affects millions of people. Stopping smoking will significantly reduce the risk of heart disease, lung disease, and many forms of cancer. Recommended strategies for quitting are covered, including working with your doctor to develop a successful plan.  Culinary   Becoming a Pensions consultant  Clinical staff conducted group or individual video education with verbal and written material and guidebook.  Patient learns that cooking at home can be healthy, cost-effective, quick, and puts them in control. Keys to cooking healthy recipes will include looking at your recipe, assessing your equipment needs, planning ahead, making it simple, choosing cost-effective seasonal ingredients, and limiting the use of added fats, salts, and sugars.  Cooking - Breakfast and Snacks  Clinical staff conducted group or individual video education with verbal and written material and guidebook.  Patient learns how important breakfast is to satiety and nutrition through the entire day. Recommendations include key foods to eat during breakfast to help stabilize blood sugar levels and to prevent overeating at meals later in the day. Planning ahead is also a key component.  Cooking - Human resources officer conducted group or individual video education with verbal and written material and guidebook.  Patient learns eating strategies to improve overall health, including an approach to cook more at home. Recommendations include thinking of animal protein as a side on your plate rather than center stage and focusing instead on lower  calorie dense options like vegetables, fruits, whole grains, and plant-based proteins, such as beans. Making sauces in large quantities to freeze for later and leaving the skin on your vegetables are also recommended to maximize your experience.  Cooking - Healthy Salads and Dressing Clinical staff conducted group or individual video education with verbal and written material and guidebook.  Patient learns that vegetables, fruits, whole grains, and legumes are the foundations of the [place]. Recommendations include how to incorporate each of these in flavorful and healthy salads, and how to create homemade salad dressings. Proper handling of ingredients is also covered. Cooking - Soups and [employer] - Soups and Desserts Clinical staff conducted group or individual video education with verbal and written material and guidebook.  Patient learns that Pritikin soups and desserts make for easy, nutritious, and delicious snacks and meal components that are low in sodium, fat, sugar, and calorie density, while high in vitamins, minerals, and filling fiber. Recommendations include simple and healthy ideas for soups and desserts.   Overview     The Pritikin Solution Program Overview Clinical staff conducted group or individual video education with verbal and written material and guidebook.  Patient learns that the results of the [place] Program have been documented in more than 100 articles published in peer-reviewed journals, and the benefits include reducing risk factors for (and, in some cases, even reversing) high cholesterol, high blood pressure, type 2 diabetes, obesity, and more! An overview of the three key pillars of the Pritikin Program will be covered: eating well, doing regular exercise, and having a healthy mind-set.  WORKSHOPS  Exercise: Exercise Basics: Building Your Action Plan Clinical staff led group instruction and group discussion with PowerPoint presentation and  patient guidebook. To enhance the learning environment the use of posters, models and videos may be added. At the conclusion of this workshop, patients will comprehend the difference between physical activity and exercise, as well as the benefits of incorporating both, into their routine. Patients will understand the FITT (Frequency, Intensity, Time, and Type) principle and how to use it to build an exercise action plan. In addition, safety concerns and other considerations for exercise and cardiac rehab will be addressed by the presenter. The purpose of this lesson is to promote a comprehensive and effective weekly exercise routine in order to improve patients' overall level of fitness.  Managing Heart Disease: Your Path to a Healthier Heart Clinical staff led group instruction and group discussion with PowerPoint presentation and patient guidebook. To enhance the learning environment the use of posters, models and videos may be added.At the conclusion of this workshop, patients will understand the anatomy and physiology of the heart. Additionally, they will understand how Pritikin's three pillars impact the risk factors, the progression, and the management of heart disease.  The purpose of this lesson is to provide a high-level overview of the heart, heart disease, and how the Pritikin lifestyle positively impacts risk factors.  Exercise Biomechanics Clinical staff led group instruction and group discussion with PowerPoint presentation and patient guidebook. To enhance the learning environment the use of posters, models and videos may be added. Patients will learn how the structural parts of their bodies function and how these functions impact their daily activities, movement, and exercise. Patients will learn how to promote a neutral spine, learn how to manage pain, and identify ways to improve their physical movement in order to promote healthy living. The purpose of this lesson is to expose  patients to common physical limitations that impact physical activity. Participants will learn practical ways to adapt and manage aches and pains, and to minimize their effect on regular exercise. Patients will learn how to maintain good posture while sitting, walking, and lifting.  Balance Training and Fall Prevention  Clinical staff led group instruction and group discussion with PowerPoint presentation and patient guidebook. To enhance the learning environment the use of posters, models and videos may be added. At the conclusion of this workshop, patients will understand the importance of their sensorimotor skills (vision, proprioception, and the vestibular system) in maintaining their ability to balance as they age. Patients will apply a variety of balancing exercises that are appropriate for their current level of function. Patients will understand the common causes for poor balance, possible solutions to these problems, and ways to modify their physical environment in order to minimize their fall risk. The purpose of this lesson is to teach patients about the importance of maintaining balance as they age and ways to minimize their risk of falling.  WORKSHOPS   Nutrition:  Fueling a Scientist, research (physical sciences) led group instruction and group discussion with PowerPoint presentation and patient guidebook. To enhance the learning environment the use of posters, models and videos may be added. Patients will review the foundational principles of the [place] and understand what constitutes a serving size in each of the food groups. Patients will also learn Prit[facility]in-friendly foods that are better choices when away from home and review make-ahead meal and snack options. Calorie density will be reviewed and applied to three nutrition priorities: weight maintenance, weight loss, and weight gain. The purpose of this lesson is to reinforce (in a group setting) the key concepts around what  patients are recommended to eat and how to apply these guidelines when away from home by planning and selecting Pritikin-friendly options. Patients will understand how calorie density may be adjusted for different weight management goals.  Mindful Eating  Clinical staff led group instruction and group discussion with PowerPoint presentation and patient guidebook. To enhance the learning environment the use of posters, models and videos may be added. Patients will briefly review the concepts of the [place] and the importance of low-calorie dense foods. The concept of mindful eating will be introduced as well as the importance of paying attention to internal hunger signals. Triggers for non-hunger eating and  techniques for dealing with triggers will be explored. The purpose of this lesson is to provide patients with the opportunity to review the basic principles of the [place], discuss the value of eating mindfully and how to measure internal cues of hunger and fullness using the Hunger Scale. Patients will also discuss reasons for non-hunger eating and learn strategies to use for controlling emotional eating.  Targeting Your Nutrition Priorities Clinical staff led group instruction and group discussion with PowerPoint presentation and patient guidebook. To enhance the learning environment the use of posters, models and videos may be added. Patients will learn how to determine their genetic susceptibility to disease by reviewing their family history. Patients will gain insight into the importance of diet as part of an overall healthy lifestyle in mitigating the impact of genetics and other environmental insults. The purpose of this lesson is to provide patients with the opportunity to assess their personal nutrition priorities by looking at their family history, their own health history and current risk factors. Patients will also be able to discuss ways of prioritizing and modifying  the [place] for their highest risk areas  Menu  Clinical staff led group instruction and group discussion with PowerPoint presentation and patient guidebook. To enhance the learning environment the use of posters, models and videos may be added. Using menus brought in from [employer], or printed from [employer], patients will apply the [place] dining out guidelines that were presented in the [employer] video. Patients will also be able to practice these guidelines in a variety of provided scenarios. The purpose of this lesson is to provide patients with the opportunity to practice hands-on learning of the [place] with actual menus and practice scenarios.  Label Reading Clinical staff led group instruction and group discussion with PowerPoint presentation and patient guidebook. To enhance the learning environment the use of posters, models and videos may be added. Patients will review and discuss the Pritikin label reading guidelines presented in [employer]ritikin's Label Reading Educational series video. Using fool labels brought in from local grocery stores and markets, patients will apply the label reading guidelines and determine if the packaged food meet the Pritikin guidelines. The purpose of this lesson is to provide patients with the opportunity to review, discuss, and practice hands-on learning of the Pritikin Label Reading guidelines with actual packaged food labels. [place] Workshops are designed to teach patients ways to prepare quick, simple, and affordable recipes at home. The importance of nutrition's role in chronic disease risk reduction is reflected in its emphasis in the overall [facility]in program. By learning how to prepare essential core Pritikin Eating Plan recipes, patients will increase control over what they eat; be able to customize the flavor of foods without the use of added salt, sugar, or  fat; and improve the quality of the food they consume. By learning a set of core recipes which are easily assembled, quickly prepared, and affordable, patients are more likely to prepare more healthy foods at home. These workshops focus on convenient breakfasts, simple entres, side dishes, and desserts which can be prepared with minimal effort and are consistent with nutrition recommendations for cardiovascular risk reduction. Cooking [employer] are taught by a Engineer, materials (RD) who has been trained by the [employer]. The chef or RD has a clear understanding of the importance of minimizing - if not completely eliminating - added fat, sugar, and sodium in  recipes. Throughout the series of [place] Workshop sessions, patients will learn about healthy ingredients and efficient methods of cooking to build confidence in their capability to prepare    Cooking School weekly topics:  Adding Flavor- Sodium-Free  Fast and Healthy Breakfasts  Powerhouse Plant-Based Proteins  Satisfying Salads and Dressings  Simple Sides and Sauces  International Cuisine-Spotlight on the [place] Zones  Delicious Desserts  Savory Soups  Efficiency Cooking - Meals in a Snap  Tasty Appetizers and Snacks  Comforting Weekend Breakfasts  One-Pot Wonders   Fast Evening Meals  Easy [place] (Psychosocial): New Thoughts, New Behaviors Clinical staff led group instruction and group discussion with PowerPoint presentation and patient guidebook. To enhance the learning environment the use of posters, models and videos may be added. Patients will learn and practice techniques for developing effective health and lifestyle goals. Patients will be able to effectively apply the goal setting process learned to develop at least one new personal goal.  The purpose of this lesson is to expose patients to a new skill set of behavior  modification techniques such as techniques setting SMART goals, overcoming barriers, and achieving new thoughts and new behaviors.  Managing Moods and Relationships Clinical staff led group instruction and group discussion with PowerPoint presentation and patient guidebook. To enhance the learning environment the use of posters, models and videos may be added. Patients will learn how emotional and chronic stress factors can impact their health and relationships. They will learn healthy ways to manage their moods and utilize positive coping mechanisms. In addition, ICR patients will learn ways to improve communication skills. The purpose of this lesson is to expose patients to ways of understanding how one's mood and health are intimately connected. Developing a healthy outlook can help build positive relationships and connections with others. Patients will understand the importance of utilizing effective communication skills that include actively listening and being heard. They will learn and understand the importance of the "4 Cs" and especially Connections in fostering of a Healthy Mind-Set.  Healthy Sleep for a Healthy Heart Clinical staff led group instruction and group discussion with PowerPoint presentation and patient guidebook. To enhance the learning environment the use of posters, models and videos may be added. At the conclusion of this workshop, patients will be able to demonstrate knowledge of the importance of sleep to overall health, well-being, and quality of life. They will understand the symptoms of, and treatments for, common sleep disorders. Patients will also be able to identify daytime and nighttime behaviors which impact sleep, and they will be able to apply these tools to help manage sleep-related challenges. The purpose of this lesson is to provide patients with a general overview of sleep and outline the importance of quality sleep. Patients will learn about a few of the most common  sleep disorders. Patients will also be introduced to the concept of "sleep hygiene," and discover ways to self-manage certain sleeping problems through simple daily behavior changes. Finally, the workshop will motivate patients by clarifying the links between quality sleep and their goals of heart-healthy living.   Recognizing and Reducing Stress Clinical staff led group instruction and group discussion with PowerPoint presentation and patient guidebook. To enhance the learning environment the use of posters, models and videos may be added. At the conclusion of this workshop, patients will be able to understand the types of stress reactions, differentiate between acute and chronic stress, and recognize the impact that  chronic stress has on their health. They will also be able to apply different coping mechanisms, such as reframing negative self-talk. Patients will have the opportunity to practice a variety of stress management techniques, such as deep abdominal breathing, progressive muscle relaxation, and/or guided imagery.  The purpose of this lesson is to educate patients on the role of stress in their lives and to provide healthy techniques for coping with it.  Learning Barriers/Preferences:  Learning Barriers/Preferences - 01/05/22 1458       Learning Barriers/Preferences   Learning Barriers Sight   wears glasses   Learning Preferences Computer/Internet;Audio;Group Instruction;Individual Instruction;Pictoral;Skilled Demonstration;Verbal Instruction;Video;Written Material             Education Topics:  Knowledge Questionnaire Score:  Knowledge Questionnaire Score - 01/05/22 1458       Knowledge Questionnaire Score   Pre Score 20/24             Core Components/Risk Factors/Patient Goals at Admission:  Personal Goals and Risk Factors at Admission - 01/05/22 1458       Core Components/Risk Factors/Patient Goals on Admission    Weight Management Yes    Intervention Weight  Management: Develop a combined nutrition and exercise program designed to reach desired caloric intake, while maintaining appropriate intake of nutrient and fiber, sodium and fats, and appropriate energy expenditure required for the weight goal.;Weight Management: Provide education and appropriate resources to help participant work on and attain dietary goals.    Expected Outcomes Short Term: Continue to assess and modify interventions until short term weight is achieved;Long Term: Adherence to nutrition and physical activity/exercise program aimed toward attainment of established weight goal;Understanding recommendations for meals to include 15-35% energy as protein, 25-35% energy from fat, 35-60% energy from carbohydrates, less than '200mg'$  of dietary cholesterol, 20-35 gm of total fiber daily;Understanding of distribution of calorie intake throughout the day with the consumption of 4-5 meals/snacks    Heart Failure Yes    Intervention Provide a combined exercise and nutrition program that is supplemented with education, support and counseling about heart failure. Directed toward relieving symptoms such as shortness of breath, decreased exercise tolerance, and extremity edema.    Expected Outcomes Improve functional capacity of life;Short term: Attendance in program 2-3 days a week with increased exercise capacity. Reported lower sodium intake. Reported increased fruit and vegetable intake. Reports medication compliance.;Short term: Daily weights obtained and reported for increase. Utilizing diuretic protocols set by physician.;Long term: Adoption of self-care skills and reduction of barriers for early signs and symptoms recognition and intervention leading to self-care maintenance.    Hypertension Yes    Intervention Provide education on lifestyle modifcations including regular physical activity/exercise, weight management, moderate sodium restriction and increased consumption of fresh fruit, vegetables, and  low fat dairy, alcohol moderation, and smoking cessation.;Monitor prescription use compliance.    Expected Outcomes Short Term: Continued assessment and intervention until BP is < 140/55m HG in hypertensive participants. < 130/88mHG in hypertensive participants with diabetes, heart failure or chronic kidney disease.;Long Term: Maintenance of blood pressure at goal levels.    Lipids Yes    Intervention Provide education and support for participant on nutrition & aerobic/resistive exercise along with prescribed medications to achieve LDL '70mg'$ , HDL >'40mg'$ .    Expected Outcomes Short Term: Participant states understanding of desired cholesterol values and is compliant with medications prescribed. Participant is following exercise prescription and nutrition guidelines.;Long Term: Cholesterol controlled with medications as prescribed, with individualized exercise RX and with personalized nutrition plan. Value goals:  LDL < '70mg'$ , HDL > 40 mg.             Core Components/Risk Factors/Patient Goals Review:   Goals and Risk Factor Review     Row Name 01/27/22 1540 02/15/22 1308 03/14/22 1103         Core Components/Risk Factors/Patient Goals Review   Personal Goals Review Weight Management/Obesity;Hypertension;Lipids;Heart Failure Weight Management/Obesity;Hypertension;Lipids;Heart Failure Weight Management/Obesity;Hypertension;Lipids;Heart Failure     Review Kostas started intensive cardiac rehab on 01/16/22. Charvez did well with exercise. Ed is doing well with exercise at intensive cardiac rehab. Ed's vital signs have been stable. Ed has lost 2.7 kg since starting cardiac rehab Ed is doing well with exercise at intensive cardiac rehab. Ed's vital signs have been stable. Ed has increased his workloads  Ed has maintianed his weight  loss 2.7 kg since starting cardiac rehab. Ed will complete intensive cardiac rehab on 03/24/22     Expected Outcomes Aaronjohn will continue to participate in intensive cardiac  rehab for exercise, nutrition and lifestyle modifications Amitoj will continue to participate in intensive cardiac rehab for exercise, nutrition and lifestyle modifications Demetria will continue to participate in intensive cardiac rehab for exercise, nutrition and lifestyle modifications              Core Components/Risk Factors/Patient Goals at Discharge (Final Review):   Goals and Risk Factor Review - 03/14/22 1103       Core Components/Risk Factors/Patient Goals Review   Personal Goals Review Weight Management/Obesity;Hypertension;Lipids;Heart Failure    Review Ed is doing well with exercise at intensive cardiac rehab. Ed's vital signs have been stable. Ed has increased his workloads  Ed has maintianed his weight  loss 2.7 kg since starting cardiac rehab. Ed will complete intensive cardiac rehab on 03/24/22    Expected Outcomes Ahad will continue to participate in intensive cardiac rehab for exercise, nutrition and lifestyle modifications             ITP Comments:  ITP Comments     Row Name 01/05/22 1536 01/17/22 0857 01/27/22 1538 02/15/22 1306 03/14/22 1101   ITP Comments Dr Fransico Him MD, Medical Director, Introduction to [facility] Education Program/ Intensive cardiac rehab. Inital orientatin packet reviewed with the patient 30 Day ITP Review. Dieon attended cardiac rehab on 01/05/22 and is scheduled to begin intensive cardiac rehab on 01/27/22. 30 Day ITP Review. Ed started intensive cardiac rehab on 01/27/22 and did well with exercise 30 Day ITP Review. Ed has good participation in intensive cardiac rehab when in attendance. 30 Day ITP Review. Ed has good participation in intensive cardiac rehab when in attendance. Ed will complete intensive cardiac rehab on 03/24/22            Comments: See ITP comments.

## 2022-03-15 ENCOUNTER — Encounter (HOSPITAL_COMMUNITY): Payer: BC Managed Care – PPO

## 2022-03-16 ENCOUNTER — Ambulatory Visit (HOSPITAL_COMMUNITY): Payer: BC Managed Care – PPO

## 2022-03-17 ENCOUNTER — Encounter (HOSPITAL_COMMUNITY): Payer: BC Managed Care – PPO

## 2022-03-20 ENCOUNTER — Encounter (HOSPITAL_COMMUNITY): Payer: BC Managed Care – PPO

## 2022-03-22 ENCOUNTER — Encounter (HOSPITAL_COMMUNITY): Payer: BC Managed Care – PPO

## 2022-03-24 ENCOUNTER — Encounter (HOSPITAL_COMMUNITY): Payer: BC Managed Care – PPO

## 2022-03-27 ENCOUNTER — Encounter (HOSPITAL_COMMUNITY)
Admission: RE | Admit: 2022-03-27 | Discharge: 2022-03-27 | Disposition: A | Discharge status: Home / Self Care | Payer: BC Managed Care – PPO | Source: Ambulatory Visit | Attending: Internal Medicine | Admitting: Internal Medicine

## 2022-03-27 DIAGNOSIS — Z48812 Encounter for surgical aftercare following surgery on the circulatory system: Secondary | ICD-10-CM | POA: Diagnosis not present

## 2022-03-27 DIAGNOSIS — I252 Old myocardial infarction: Secondary | ICD-10-CM | POA: Insufficient documentation

## 2022-03-27 DIAGNOSIS — Z955 Presence of coronary angioplasty implant and graft: Secondary | ICD-10-CM | POA: Diagnosis not present

## 2022-03-27 DIAGNOSIS — I214 Non-ST elevation (NSTEMI) myocardial infarction: Secondary | ICD-10-CM | POA: Diagnosis not present

## 2022-03-28 DIAGNOSIS — I129 Hypertensive chronic kidney disease with stage 1 through stage 4 chronic kidney disease, or unspecified chronic kidney disease: Secondary | ICD-10-CM | POA: Diagnosis not present

## 2022-03-28 DIAGNOSIS — I110 Hypertensive heart disease with heart failure: Secondary | ICD-10-CM | POA: Diagnosis not present

## 2022-03-28 DIAGNOSIS — E782 Mixed hyperlipidemia: Secondary | ICD-10-CM | POA: Diagnosis not present

## 2022-03-28 DIAGNOSIS — N183 Chronic kidney disease, stage 3 unspecified: Secondary | ICD-10-CM | POA: Diagnosis not present

## 2022-03-28 DIAGNOSIS — Z0001 Encounter for general adult medical examination with abnormal findings: Secondary | ICD-10-CM | POA: Diagnosis not present

## 2022-03-28 DIAGNOSIS — E038 Other specified hypothyroidism: Secondary | ICD-10-CM | POA: Diagnosis not present

## 2022-03-28 DIAGNOSIS — I5020 Unspecified systolic (congestive) heart failure: Secondary | ICD-10-CM | POA: Diagnosis not present

## 2022-03-29 ENCOUNTER — Encounter (HOSPITAL_COMMUNITY)
Admission: RE | Admit: 2022-03-29 | Discharge: 2022-03-29 | Disposition: A | Discharge status: Home / Self Care | Payer: BC Managed Care – PPO | Source: Ambulatory Visit | Attending: Internal Medicine | Admitting: Internal Medicine

## 2022-03-29 DIAGNOSIS — Z955 Presence of coronary angioplasty implant and graft: Secondary | ICD-10-CM

## 2022-03-29 DIAGNOSIS — Z48812 Encounter for surgical aftercare following surgery on the circulatory system: Secondary | ICD-10-CM | POA: Diagnosis not present

## 2022-03-29 DIAGNOSIS — I214 Non-ST elevation (NSTEMI) myocardial infarction: Secondary | ICD-10-CM

## 2022-03-29 DIAGNOSIS — I25110 Atherosclerotic heart disease of native coronary artery with unstable angina pectoris: Secondary | ICD-10-CM | POA: Diagnosis not present

## 2022-03-29 DIAGNOSIS — I252 Old myocardial infarction: Secondary | ICD-10-CM | POA: Diagnosis not present

## 2022-03-30 DIAGNOSIS — G4733 Obstructive sleep apnea (adult) (pediatric): Secondary | ICD-10-CM | POA: Diagnosis not present

## 2022-03-31 ENCOUNTER — Encounter (HOSPITAL_COMMUNITY)
Admission: RE | Admit: 2022-03-31 | Discharge: 2022-03-31 | Disposition: A | Discharge status: Home / Self Care | Payer: BC Managed Care – PPO | Source: Ambulatory Visit | Attending: Internal Medicine | Admitting: Internal Medicine

## 2022-03-31 DIAGNOSIS — I252 Old myocardial infarction: Secondary | ICD-10-CM | POA: Diagnosis not present

## 2022-03-31 DIAGNOSIS — Z955 Presence of coronary angioplasty implant and graft: Secondary | ICD-10-CM | POA: Diagnosis not present

## 2022-03-31 DIAGNOSIS — I214 Non-ST elevation (NSTEMI) myocardial infarction: Secondary | ICD-10-CM | POA: Diagnosis not present

## 2022-03-31 DIAGNOSIS — Z48812 Encounter for surgical aftercare following surgery on the circulatory system: Secondary | ICD-10-CM | POA: Diagnosis not present

## 2022-03-31 DIAGNOSIS — R7881 Bacteremia: Secondary | ICD-10-CM | POA: Diagnosis not present

## 2022-04-03 ENCOUNTER — Encounter (HOSPITAL_COMMUNITY)
Admission: RE | Admit: 2022-04-03 | Discharge: 2022-04-03 | Disposition: A | Discharge status: Home / Self Care | Payer: BC Managed Care – PPO | Source: Ambulatory Visit | Attending: Internal Medicine | Admitting: Internal Medicine

## 2022-04-03 DIAGNOSIS — I214 Non-ST elevation (NSTEMI) myocardial infarction: Secondary | ICD-10-CM

## 2022-04-03 DIAGNOSIS — Z955 Presence of coronary angioplasty implant and graft: Secondary | ICD-10-CM | POA: Diagnosis not present

## 2022-04-03 DIAGNOSIS — Z48812 Encounter for surgical aftercare following surgery on the circulatory system: Secondary | ICD-10-CM | POA: Diagnosis not present

## 2022-04-03 DIAGNOSIS — I252 Old myocardial infarction: Secondary | ICD-10-CM | POA: Diagnosis not present

## 2022-04-05 ENCOUNTER — Encounter (HOSPITAL_COMMUNITY)
Admission: RE | Admit: 2022-04-05 | Discharge: 2022-04-05 | Disposition: A | Discharge status: Home / Self Care | Payer: BC Managed Care – PPO | Source: Ambulatory Visit | Attending: Internal Medicine | Admitting: Internal Medicine

## 2022-04-05 ENCOUNTER — Other Ambulatory Visit: Payer: Self-pay | Admitting: Cardiology

## 2022-04-05 ENCOUNTER — Encounter (HOSPITAL_COMMUNITY): Payer: BC Managed Care – PPO

## 2022-04-05 DIAGNOSIS — I214 Non-ST elevation (NSTEMI) myocardial infarction: Secondary | ICD-10-CM | POA: Diagnosis not present

## 2022-04-05 DIAGNOSIS — Z48812 Encounter for surgical aftercare following surgery on the circulatory system: Secondary | ICD-10-CM | POA: Diagnosis not present

## 2022-04-05 DIAGNOSIS — Z955 Presence of coronary angioplasty implant and graft: Secondary | ICD-10-CM

## 2022-04-05 DIAGNOSIS — I252 Old myocardial infarction: Secondary | ICD-10-CM | POA: Diagnosis not present

## 2022-04-05 DIAGNOSIS — I480 Paroxysmal atrial fibrillation: Secondary | ICD-10-CM

## 2022-04-05 NOTE — Progress Notes (Signed)
Cardiac Individual Treatment Plan  Patient Details  Name: Alex Arias MRN: [id number] Date of Birth: 04-27-1955 Referring Provider:   Flowsheet Row INTENSIVE CARDIAC REHAB ORIENT from 01/05/2022 in [facility] for Heart, Vascular, & [place]  Referring Provider Alex Hew, MD       Initial Encounter Date:  Alex Arias from 01/05/2022 in [facility] for Heart, Vascular, & Lung Health  Date 01/05/22       Visit Diagnosis: 10/30/21 NSTEMI  10/30/21 S/P DES LAD  Patient's Home Medications on Admission:  Current Outpatient Medications:    apixaban (ELIQUIS) 5 MG TABS tablet, Take 1 tablet (5 mg total) by mouth 2 (two) times daily., Disp: 60 tablet, Rfl: 3   atorvastatin (LIPITOR) 40 MG tablet, Take 1 tablet (40 mg total) by mouth daily., Disp: 90 tablet, Rfl: 0   Cholecalciferol (VITAMIN D3 PO), Take 1 capsule by mouth every evening., Disp: , Rfl:    clopidogrel (PLAVIX) 75 MG tablet, Take 1 tablet (75 mg total) by mouth daily., Disp: 90 tablet, Rfl: 2   empagliflozin (JARDIANCE) 10 MG TABS tablet, TAKE 1 TABLET BY MOUTH EVERY DAY, Disp: 90 tablet, Rfl: 3   KLOR-CON M20 20 MEQ tablet, TAKE 1 TABLET BY MOUTH EVERY DAY, Disp: 90 tablet, Rfl: 1   levothyroxine (SYNTHROID) 88 MCG tablet, Take 88 mcg by mouth daily before breakfast., Disp: , Rfl:    Multiple Vitamin (MULITIVITAMIN WITH MINERALS) TABS, Take 1 tablet by mouth every evening., Disp: , Rfl:    nitroGLYCERIN (NITROSTAT) 0.4 MG SL tablet, Place 1 tablet (0.4 mg total) under the tongue every 5 (five) minutes as needed for chest pain., Disp: 25 tablet, Rfl: 2   pantoprazole (PROTONIX) 40 MG tablet, Take 40 mg by mouth in the morning., Disp: , Rfl:    sacubitril-valsartan (ENTRESTO) 24-26 MG, Take 1 tablet by mouth 2 (two) times daily., Disp: 60 tablet, Rfl: 11   spironolactone (ALDACTONE) 25 MG tablet, Take 1 tablet (25 mg total) by mouth daily.,  Disp: 90 tablet, Rfl: 3   Study - EVOLVE-MI - evolocumab (REPATHA) 140 mg/mL SQ injection (PI-[place]uckey), Inject 140 mg into the skin every 14 (fourteen) days. For Investigational Use Only. Inject subcutaneously into abdomen, thigh, or upper arm every 14 days. Rotate injection sites and do not inject into areas where skin is tender, bruised, or red. Please contact [place] for any questions or concerns regarding this medication., Disp: , Rfl:    tadalafil (CIALIS) 5 MG tablet, Take 5 mg by mouth every evening., Disp: , Rfl:   Past Medical History: Past Medical History:  Diagnosis Date   Abdominal distension    Abdominal pain    Arrhythmia    Coronary artery disease involving native coronary artery of native heart with unstable angina pectoris ([place]) 10/30/2021   Cath-PCI 10/30/2021: 99% thrombotic SubTO of mLAD just after D2 -> DES PCI 3.0 x 16 Synergy XD => deployed to 3.0 mm.  2 big Diags, LCx-OM1 & Large Dominant RCA - rPDA & PAV-RPL1-3 free of disease. EF ~40-45%, LVEDP 21--25 mmHg mid-apical anterior & apical HK.   Cough    Family history of diabetes mellitus    Itching    NSTEMI (non-ST elevated myocardial infarction) ([place]) - + Troponin with Anterior & Inferior ST depressions => ongoing CP => Urgent Cath 10/30/2021   Cath with 99% subTO of LAD => DES PCI   Pleurisy    Presence of  drug coated stent in LAD coronary artery 10/30/2021   mLAD 99% thrombotic lesion: DES PCI w/ Synergy XD 3 x 16 => deployed ~3-3.1 mm   Right inguinal hernia    Unspecified viral infection, in conditions classified elsewhere and of unspecified site    viral syndrome - per notes from [facility] dated 01/03/11    Tobacco Use: Social History   Tobacco Use  Smoking Status Never  Smokeless Tobacco Never    Labs: Review Flowsheet       Latest Ref Rng & Units 04/02/2014 07/24/2014 10/31/2021 01/27/2022  Labs for ITP Cardiac and Pulmonary Rehab  Cholestrol 100 - 199 mg/dL - - 122  87   LDL  (calc) 0 - 99 mg/dL - - 57  17   HDL-C >39 mg/dL - - 44  53   Trlycerides 0 - 149 mg/dL - - 106  83   Hemoglobin A1c 4.8 - 5.6 % - - 5.1  -  TCO2 0 - 100 mmol/L 19  27  - -    Capillary Blood Glucose: No results found for: "GLUCAP"   Exercise Target Goals: Exercise Program Goal: Individual exercise prescription set using results from initial 6 min walk test and THRR while considering  patient's activity barriers and safety.   Exercise Prescription Goal: Initial exercise prescription builds to 30-45 minutes a day of aerobic activity, 2-3 days per week.  Home exercise guidelines will be given to patient during program as part of exercise prescription that the participant will acknowledge.  Activity Barriers & Risk Stratification:  Activity Barriers & Cardiac Risk Stratification - 01/05/22 1443       Activity Barriers & Cardiac Risk Stratification   Activity Barriers Decreased Ventricular Function    Cardiac Risk Stratification High   <5 METs on 6MWT            6 Minute Walk:  6 Minute Walk     Row Name 01/05/22 1442         6 Minute Walk   Phase Initial     Distance 1628 feet     Walk Time 6 minutes     # of Rest Breaks 0     MPH 3.08     METS 3.41     RPE 11     Perceived Dyspnea  0     VO2 Peak 11.94     Symptoms No     Resting HR 47 bpm     Resting BP 116/78     Resting Oxygen Saturation  99 %     Exercise Oxygen Saturation  during 6 min walk 100 %     Max Ex. HR 76 bpm     Max Ex. BP 122/80     2 Minute Post BP 116/76              Oxygen Initial Assessment:   Oxygen Re-Evaluation:   Oxygen Discharge (Final Oxygen Re-Evaluation):   Initial Exercise Prescription:  Initial Exercise Prescription - 01/05/22 1400       Date of Initial Exercise RX and Referring Provider   Date 01/05/22    Referring Provider Alex Hew, MD    Expected Discharge Date 03/03/22      NuStep   Level 3    SPM 75    Minutes 15    METs 3      Elliptical    Level 1    Minutes 15    METs 3  Prescription Details   Frequency (times per week) 3    Duration Progress to 30 minutes of continuous aerobic without signs/symptoms of physical distress      Intensity   THRR 40-80% of Max Heartrate 60-121    Ratings of Perceived Exertion 11-13    Perceived Dyspnea 0-4      Progression   Progression Continue progressive overload as per policy without signs/symptoms or physical distress.      Resistance Training   Training Prescription Yes    Weight 4.    Reps 10-15             Perform Capillary Blood Glucose checks as needed.  Exercise Prescription Changes:   Exercise Prescription Changes     Row Name 01/27/22 1613 02/17/22 1708 03/03/22 1643 03/27/22 1635       Response to Exercise   Blood Pressure (Admit) 112/68 104/64 98/62 102/70    Blood Pressure (Exercise) 128/78 136/72 122/66 134/70    Blood Pressure (Exit) 118/70 115/70 110/70 108/70    Heart Rate (Admit) 56 bpm 57 bpm 69 bpm 49 bpm    Heart Rate (Exercise) 112 bpm 105 bpm 113 bpm 111 bpm    Heart Rate (Exit) 65 bpm 64 bpm 72 bpm 58 bpm    Rating of Perceived Exertion (Exercise) 11 11.5 12 11     Perceived Dyspnea (Exercise) -- 0 0 0    Symptoms none none none none    Comments Pt first day in the CRP2 program Reviewed MET's. goals and home ExRx Reviewed MET's Reviewed MET's and goals    Duration Progress to 30 minutes of  aerobic without signs/symptoms of physical distress Progress to 30 minutes of  aerobic without signs/symptoms of physical distress Progress to 30 minutes of  aerobic without signs/symptoms of physical distress Progress to 30 minutes of  aerobic without signs/symptoms of physical distress    Intensity THRR unchanged THRR unchanged THRR unchanged THRR unchanged      Progression   Progression Continue to progress workloads to maintain intensity without signs/symptoms of physical distress. Continue to progress workloads to maintain intensity without  signs/symptoms of physical distress. Continue to progress workloads to maintain intensity without signs/symptoms of physical distress. Continue to progress workloads to maintain intensity without signs/symptoms of physical distress.    Average METs 3.55 4.3 4.5 4.9      Resistance Training   Training Prescription Yes Yes Yes Yes    Weight 4 lbs wts 4 lbs wts 4 lbs wts 4 lbs wts    Reps 10-15 10-15 10-15 10-15    Time 10 Minutes 10 Minutes 10 Minutes 10 Minutes      NuStep   Level 4 5 5 6     SPM 85 88 88 88    Minutes 15 15 15 15     METs 2.6 3.2 3.6 3.6      Recumbant Elliptical   Level -- -- -- 6    RPM -- -- -- 72    Watts -- -- -- 118    Minutes -- -- -- 15    METs -- -- -- 5.6      Elliptical   Level 1 2 2 2     Speed -- -- 1 2    Minutes 15 15 15 15     METs 4.5 5.6 5.4 5.6      Home Exercise Plan   Plans to continue exercise at -- Home (comment) Home (comment) Home (comment)    Frequency -- Add  2 additional days to program exercise sessions. Add 2 additional days to program exercise sessions. Add 2 additional days to program exercise sessions.    Initial Home Exercises Provided -- 02/17/22 02/17/22 02/17/22             Exercise Comments:   Exercise Comments     Row Name 01/27/22 1619 02/17/22 1714 03/03/22 1645 03/27/22 1640     Exercise Comments Pt first day in the CRP2 program. Pt tolerated exercise well with anaverage MET level of 3.55. Pt is learning his THRR, RPE and ExRx. Reviewed MET's, goals and home ExRx. Pt tolerated exercise well with anaverage MET level of 4.3. Pt is showing good progression and feels good with his progress so far by increasing his strength, stamina and adjustments to home ExRx. Pt is continueing to work with RD for diet modifications but feels good with changes he has made so far. Pt will continue to exercise on his own by walking and using his home elliptical 2 days for 30-45 mins per session. Reviewed MET's today. Pt tolerated exercise  well with average MET level of 4.5. Pt is doing well and increasing MET's, pt is also interested in trying different equipment which we will try on his next visit Reviewed MET's and goals. Pt tolerated exercise well with anaverage MET level of 4.9. Pt is progressing well and feels good about an increase in strength and stamina. Pt feels good about his knowlege of home Ex and diet control, hes just working on making more of a routine in his life.             Exercise Goals and Review:   Exercise Goals     Row Name 01/05/22 1452             Exercise Goals   Increase Physical Activity Yes       Intervention Provide advice, education, support and counseling about physical activity/exercise needs.;Develop an individualized exercise prescription for aerobic and resistive training based on initial evaluation findings, risk stratification, comorbidities and participant's personal goals.       Expected Outcomes Short Term: Attend rehab on a regular basis to increase amount of physical activity.;Long Term: Exercising regularly at least 3-5 days a week.;Long Term: Add in home exercise to make exercise part of routine and to increase amount of physical activity.       Increase Strength and Stamina Yes       Intervention Provide advice, education, support and counseling about physical activity/exercise needs.;Develop an individualized exercise prescription for aerobic and resistive training based on initial evaluation findings, risk stratification, comorbidities and participant's personal goals.       Expected Outcomes Short Term: Increase workloads from initial exercise prescription for resistance, speed, and METs.;Short Term: Perform resistance training exercises routinely during rehab and add in resistance training at home;Long Term: Improve cardiorespiratory fitness, muscular endurance and strength as measured by increased METs and functional capacity (6MWT)       Able to understand and use rate of  perceived exertion (RPE) scale Yes       Intervention Provide education and explanation on how to use RPE scale       Expected Outcomes Short Term: Able to use RPE daily in rehab to express subjective intensity level;Long Term:  Able to use RPE to guide intensity level when exercising independently       Knowledge and understanding of Target Heart Rate Range (THRR) Yes       Intervention Provide education  and explanation of THRR including how the numbers were predicted and where they are located for reference       Expected Outcomes Short Term: Able to state/look up THRR;Short Term: Able to use daily as guideline for intensity in rehab;Long Term: Able to use THRR to govern intensity when exercising independently       Understanding of Exercise Prescription Yes       Intervention Provide education, explanation, and written materials on patient's individual exercise prescription       Expected Outcomes Short Term: Able to explain program exercise prescription;Long Term: Able to explain home exercise prescription to exercise independently                Exercise Goals Re-Evaluation :  Exercise Goals Re-Evaluation     Row Name 01/27/22 1617 02/17/22 1710 03/27/22 1638         Exercise Goal Re-Evaluation   Exercise Goals Review Increase Physical Activity;Understanding of Exercise Prescription;Increase Strength and Stamina;Knowledge and understanding of Target Heart Rate Range (THRR);Able to understand and use rate of perceived exertion (RPE) scale Increase Physical Activity;Understanding of Exercise Prescription;Increase Strength and Stamina;Knowledge and understanding of Target Heart Rate Range (THRR);Able to understand and use rate of perceived exertion (RPE) scale Increase Physical Activity;Understanding of Exercise Prescription;Increase Strength and Stamina;Knowledge and understanding of Target Heart Rate Range (THRR);Able to understand and use rate of perceived exertion (RPE) scale      Comments Pt first day in the CRP2 program. Pt tolerated exercise well with anaverage MET level of 3.55. Pt is learning his THRR, RPE and ExRx. Reviewed MET's, goals and home ExRx. Pt tolerated exercise well with anaverage MET level of 4.3. Pt is showing good progression and feels good with his progress so far by increasing his strength, stamina and adjustments to home ExRx. Pt is continueing to work with RD for diet modifications but feels good with changes he has made so far. Pt will continue to exercise on his own by walking and using his home elliptical 2 days for 30-45 mins per session. Reviewed MET's and goals. Pt tolerated exercise well with anaverage MET level of 4.9. Pt is progressing well and feels good about an increase in strength and stamina. Pt feels good about his knowlege of home Ex and diet control, hes just working on making more of a routine in his life.     Expected Outcomes Will continue to monitor pt and progress workloads as tolerated without sign or symptom. Pt will continue to exercise on his own and gain strength. Will continue to monitor pt and progress workloads as tolerated without sign or symptom. Will continue to monitor pt and progress workloads as tolerated without sign or symptom.              Discharge Exercise Prescription (Final Exercise Prescription Changes):  Exercise Prescription Changes - 03/27/22 1635       Response to Exercise   Blood Pressure (Admit) 102/70    Blood Pressure (Exercise) 134/70    Blood Pressure (Exit) 108/70    Heart Rate (Admit) 49 bpm    Heart Rate (Exercise) 111 bpm    Heart Rate (Exit) 58 bpm    Rating of Perceived Exertion (Exercise) 11    Perceived Dyspnea (Exercise) 0    Symptoms none    Comments Reviewed MET's and goals    Duration Progress to 30 minutes of  aerobic without signs/symptoms of physical distress    Intensity THRR unchanged  Progression   Progression Continue to progress workloads to maintain intensity  without signs/symptoms of physical distress.    Average METs 4.9      Resistance Training   Training Prescription Yes    Weight 4 lbs wts    Reps 10-15    Time 10 Minutes      NuStep   Level 6    SPM 88    Minutes 15    METs 3.6      Recumbant Elliptical   Level 6    RPM 72    Watts 118    Minutes 15    METs 5.6      Elliptical   Level 2    Speed 2    Minutes 15    METs 5.6      Home Exercise Plan   Plans to continue exercise at Home (comment)    Frequency Add 2 additional days to program exercise sessions.    Initial Home Exercises Provided 02/17/22             Nutrition:  Target Goals: Understanding of nutrition guidelines, daily intake of sodium 1500mg , cholesterol 200mg , calories 30% from fat and 7% or less from saturated fats, daily to have 5 or more servings of fruits and vegetables.  Biometrics:  Pre Biometrics - 01/05/22 1439       Pre Biometrics   Waist Circumference 43 inches    Hip Circumference 43.5 inches    Waist to Hip Ratio 0.99 %    Triceps Skinfold 13 mm    % Body Fat 27.7 %    Grip Strength 42 kg    Flexibility 15.75 in    Single Leg Stand 20.93 seconds              Nutrition Therapy Plan and Nutrition Goals:  Nutrition Therapy & Goals - 03/27/22 1512       Nutrition Therapy   Diet Heart Healthy Diet    Drug/Food Interactions Statins/Certain Fruits      Personal Nutrition Goals   Nutrition Goal Patient to identify strategies for reducing cardiovascular risk by attending the [facility] education and nutrition series weekly    Personal Goal #2 Patient to improve diet quality by using the plate method as a guide for meal planning to include lean protein/plant protein, fruits, vegetables, whole grains, and nonfat dairy as part of a balanced diet    Personal Goal #3 Patient to reduce sodium to 1500mg  per day    Comments Goals in progress. He continues to attend the [facility] education and nutrition series regularly.  Ed reports  increased exercise/physical activity, decreased meal skipping, and reduced portion sizes. He reports reduced sodium intake and reading food labels for sodium. He will continue to benefit from attending Intensive Cardiac Rehab for nutrition, exercise and lifestyle modification.      Intervention Plan   Intervention Prescribe, educate and counsel regarding individualized specific dietary modifications aiming towards targeted core components such as weight, hypertension, lipid management, diabetes, heart failure and other comorbidities.;Nutrition handout(s) given to patient.    Expected Outcomes Short Term Goal: Understand basic principles of dietary content, such as calories, fat, sodium, cholesterol and nutrients.;Long Term Goal: Adherence to prescribed nutrition plan.             Nutrition Assessments:  Nutrition Assessments - 01/05/22 1320       Rate Your Plate Scores   Pre Score 54            MEDIFICTS  Score Key: ?70 Need to make dietary changes  40-70 Heart Healthy Diet ? 40 Therapeutic Level Cholesterol Diet   Flowsheet Row INTENSIVE CARDIAC REHAB ORIENT from 01/05/2022 in [facility] for Heart, Vascular, & Lung Health  Picture Your Plate Total Score on Admission 54      Picture Your Plate Scores: [id number] Unhealthy dietary pattern with much room for improvement. 41-50 Dietary pattern unlikely to meet recommendations for good health and room for improvement. 51-60 More healthful dietary pattern, with some room for improvement.  >60 Healthy dietary pattern, although there may be some specific behaviors that could be improved.    Nutrition Goals Re-Evaluation:  Nutrition Goals Re-Evaluation     [place] Name 01/27/22 1540 02/27/22 1533 03/27/22 1512         Goals   Current Weight -- 219 lb 9.3 oz (99.6 kg) 216 lb 0.8 oz (98 kg)     Comment lipids at goal WNL, A1c WNL, lipoprotein A 115.1 no new labs; most recent labs lipids at goal WNL, A1c WNL,  lipoprotein A 115.1; he is down 4.6# since starting with our program (start weight 101.7kg) no new labs; most recent labs lipids at goal WNL, A1c WNL, lipoprotein A 115.1; he is down 8# since starting with our program (start weight 101.7kg)     Expected Outcome Patient reports normal appetite and eating two meals daily, skipping lunch. 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 Lipids are at goal and A1c WNL. His wife is very supportive of making lifestyle changes; she is a Marine scientist. He will continue to benefit from attending Intensive Cardiac Rehab for nutrition, exercise and lifestyle modification. Goals in progress. He continues to attend the [facility] education and nutrition series regularly. Ed reports increased exercise/physical activity, decreased meal skipping, and reduced portion sizes. He reports reduced sodium intake and reading food labels for sodium. He will continue to benefit from attending Intensive Cardiac Rehab for nutrition, exercise and lifestyle modification. Goals in progress. He continues to attend the [facility] education and nutrition series regularly. Ed reports increased exercise/physical activity, decreased meal skipping, and reduced portion sizes. He reports reduced sodium intake and reading food labels for sodium. He will continue to benefit from attending Intensive Cardiac Rehab for nutrition, exercise and lifestyle modification.              Nutrition Goals Re-Evaluation:  Nutrition Goals Re-Evaluation     [place] Name 01/27/22 1540 02/27/22 1533 03/27/22 1512         Goals   Current Weight -- 219 lb 9.3 oz (99.6 kg) 216 lb 0.8 oz (98 kg)     Comment lipids at goal WNL, A1c WNL, lipoprotein A 115.1 no new labs; most recent labs lipids at goal WNL, A1c WNL, lipoprotein A 115.1; he is down 4.6# since starting with our program (start weight 101.7kg) no new labs; most recent labs lipids at goal WNL, A1c WNL, lipoprotein A 115.1; he is down 8# since starting with our program (start weight  101.7kg)     Expected Outcome Patient reports normal appetite and eating two meals daily, skipping lunch. 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 Lipids are at goal and A1c WNL. His wife is very supportive of making lifestyle changes; she is a Marine scientist. He will continue to benefit from attending Intensive Cardiac Rehab for nutrition, exercise and lifestyle modification. Goals in progress. He continues to attend the [facility] education and nutrition series regularly. Ed reports increased exercise/physical activity, decreased meal skipping, and reduced portion sizes.  He reports reduced sodium intake and reading food labels for sodium. He will continue to benefit from attending Intensive Cardiac Rehab for nutrition, exercise and lifestyle modification. Goals in progress. He continues to attend the [facility] education and nutrition series regularly. Ed reports increased exercise/physical activity, decreased meal skipping, and reduced portion sizes. He reports reduced sodium intake and reading food labels for sodium. He will continue to benefit from attending Intensive Cardiac Rehab for nutrition, exercise and lifestyle modification.              Nutrition Goals Discharge (Final Nutrition Goals Re-Evaluation):  Nutrition Goals Re-Evaluation - 03/27/22 1512       Goals   Current Weight 216 lb 0.8 oz (98 kg)    Comment no new labs; most recent labs lipids at goal WNL, A1c WNL, lipoprotein A 115.1; he is down 8# since starting with our program (start weight 101.7kg)    Expected Outcome Goals in progress. He continues to attend the [facility] education and nutrition series regularly. Ed reports increased exercise/physical activity, decreased meal skipping, and reduced portion sizes. He reports reduced sodium intake and reading food labels for sodium. He will continue to benefit from attending Intensive Cardiac Rehab for nutrition, exercise and lifestyle modification.             Psychosocial: Target Goals:  Acknowledge presence or absence of significant depression and/or stress, maximize coping skills, provide positive support system. Participant is able to verbalize types and ability to use techniques and skills needed for reducing stress and depression.  Initial Review & Psychosocial Screening:  Initial Psych Review & Screening - 01/05/22 1538       Initial Review   Current issues with None Identified      Family Dynamics   Good Support System? Yes   Ed has his wife and two daughter for support     Barriers   Psychosocial barriers to participate in program There are no identifiable barriers or psychosocial needs.      Screening Interventions   Interventions Encouraged to exercise             Quality of Life Scores:  Quality of Life - 01/05/22 1453       Quality of Life   Select Quality of Life      Quality of Life Scores   Health/Function Pre 26.73 %    Socioeconomic Pre 25.42 %    Psych/Spiritual Pre 27.71 %    Family Pre 26.4 %    GLOBAL Pre 26.65 %            Scores of 19 and below usually indicate a poorer quality of life in these areas.  A difference of  2-3 points is a clinically meaningful difference.  A difference of 2-3 points in the total score of the Quality of Life Index has been associated with significant improvement in overall quality of life, self-image, physical symptoms, and general health in studies assessing change in quality of life.  PHQ-9: Review Flowsheet       01/05/2022  Depression screen PHQ 2/9  Decreased Interest 0  Down, Depressed, Hopeless 0  PHQ - 2 Score 0   Interpretation of Total Score  Total Score Depression Severity:  1-4 = Minimal depression, 5-9 = Mild depression, 10-14 = Moderate depression, 15-19 = Moderately severe depression, 20-27 = Severe depression   Psychosocial Evaluation and Intervention:   Psychosocial Re-Evaluation:  Psychosocial Re-Evaluation     [place] Name 01/27/22 1539 02/15/22 1308 03/14/22  1102  04/05/22 1735       Psychosocial Re-Evaluation   Current issues with None Identified None Identified None Identified None Identified    Interventions Encouraged to attend Cardiac Rehabilitation for the exercise Encouraged to attend Cardiac Rehabilitation for the exercise Encouraged to attend Cardiac Rehabilitation for the exercise Encouraged to attend Cardiac Rehabilitation for the exercise    Continue Psychosocial Services  No Follow up required No Follow up required No Follow up required No Follow up required             Psychosocial Discharge (Final Psychosocial Re-Evaluation):  Psychosocial Re-Evaluation - 04/05/22 1735       Psychosocial Re-Evaluation   Current issues with None Identified    Interventions Encouraged to attend Cardiac Rehabilitation for the exercise    Continue Psychosocial Services  No Follow up required             Vocational Rehabilitation: Provide vocational rehab assistance to qualifying candidates.   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 Participant will state understanding/return demonstration of topics presented.    Education     Row Name 01/27/22 1400     Education   Cardiac Education Topics Pritikin   Charity fundraiser Exercise Physiologist   Select Psychosocial   Psychosocial Healthy Minds, Bodies, Hearts   Instruction Review Code 1- Verbalizes Understanding   Class Start Time 1358   Class Stop Time 1432   Class Time Calculation (min) 34 min    [place] Name 01/30/22 1600     Education   Cardiac Education Topics Pritikin   Data processing manager Nutrition   Nutrition Workshop Label Reading   Instruction Review Code 1- Information systems manager   Class Start Time 1400   Class Stop Time 1458   Class Time Calculation (min) 58 min    [place] Name 02/02/22 1000     Education   Cardiac Education Topics Pritikin   [employer]  completed 02/01/22     Cooking School   Educator Dietitian   Weekly Topic [employer] Desserts   Instruction Review Code 1- Information systems manager   Class Start Time 1400   Class Stop Time 1458   Class Time Calculation (min) 58 min    [place] Name 02/06/22 1500     Education   Cardiac Education Topics Pritikin   Financial planner Exercise Physiologist   Select Psychosocial   Psychosocial Workshop Other  Focused Goals, Sustainable changes   Instruction Review Code 1- Verbalizes Understanding   Class Start Time 1402   Class Stop Time 1445   Class Time Calculation (min) 43 min    [place] Name 02/08/22 1600     Education   Cardiac Education Topics Pritikin   Financial trader   Weekly Topic Tasty Appetizers and Snacks   Instruction Review Code 1- Verbalizes Understanding   Class Start Time 1402   Class Stop Time  1448   Class Time Calculation (min) 46 min    [place] Name 02/13/22 1500     Education   Cardiac Education Topics [place]   Select Workshops     Workshops   Educator Exercise Physiologist   Select Exercise   Exercise Workshop Exercise Basics: Building Your Action Plan   Instruction Review Code 1- Verbalizes Understanding   Class Start Time 1359   Class Stop Time 1445   Class Time Calculation (min) 46 min    [place] Name 02/17/22 1400     Education   Cardiac Education Topics Pritikin   Academic librarian Exercise Education   Exercise Education Move It!   Instruction Review Code 1- Verbalizes Understanding   Class Start  Time 1405   Class Stop Time 1442   Class Time Calculation (min) 37 min    [place] Name 02/27/22 1500     Education   Cardiac Education Topics Pritikin   Environmental consultant Psychosocial   Psychosocial Workshop Healthy Sleep for a Healthy Heart   Instruction Review Code 1- Verbalizes Understanding   Class Start Time 1400   Class Stop Time 1450   Class Time Calculation (min) 50 min    [place] Name 03/01/22 1500     Education   Cardiac Education Topics Pritikin   Financial trader   Weekly Topic Comforting Weekend Breakfasts   Instruction Review Code 1- Verbalizes Understanding   Class Start Time 1400   Class Stop Time 1445   Class Time Calculation (min) 45 min    [place] Name 03/03/22 1500     Education   Cardiac Education Topics Pritikin   Lexicographer Nutrition   Nutrition Dining Out - Part 1   Instruction Review Code 1- Verbalizes Understanding   Class Start Time 1405   Class Stop Time 1440   Class Time Calculation (min) 35 min    [place] Name 03/08/22 1600     Education   Cardiac Education Topics Pritikin   Financial trader   Weekly Topic Fast Evening Meals   Instruction Review Code 1- Verbalizes Understanding   Class Start Time 1400   Class Stop Time 1445   Class Time Calculation (min) 45 min    [place] Name 03/13/22 1500     Education   Cardiac Education Topics Pritikin   Financial trader   Weekly Topic International Cuisine- Spotlight on the [place]   Instruction Review Code 1- Verbalizes Understanding   Class Start Time 1405   Class Stop Time 1453   Class Time Calculation (min) 48 min    [place] Name 03/27/22 1500     Education   Cardiac Education Topics Pritikin   Select Workshops     Workshops   Educator Exercise  Physiologist   Select Exercise   Exercise Workshop Hotel manager and Fall Prevention   Instruction Review Code 1- Verbalizes Understanding   Class Start Time 1350   Class Stop Time 1432   Class Time Calculation (min) 42 min    [place] Name 03/29/22 1500     Education  Cardiac Education Topics Pritikin   Financial trader   Weekly Topic Fast and Healthy Breakfasts   Instruction Review Code 1- Verbalizes Understanding   Class Start Time 1355   Class Stop Time 1450   Class Time Calculation (min) 55 min    [place] Name 03/31/22 1600     Education   Cardiac Education Topics Pritikin   Lexicographer Nutrition   Nutrition Overview of the [place]   Instruction Review Code 1- Verbalizes Understanding   Class Start Time 1400   Class Stop Time 1436   Class Time Calculation (min) 36 min    [place] Name 04/03/22 1600     Education   Cardiac Education Topics Pritikin   Select Workshops     Workshops   Educator Exercise Physiologist   Select Psychosocial   Psychosocial Workshop Recognizing and Reducing Stress   Instruction Review Code 1- Verbalizes Understanding   Class Start Time 1405   Class Stop Time 1454   Class Time Calculation (min) 49 min    [place] Name 04/05/22 1600     Education   Cardiac Education Topics Pritikin   Financial trader   Weekly Topic Personalizing Your Pritikin Plate   Instruction Review Code 1- Verbalizes Understanding   Class Start Time 1400   Class Stop Time 1448   Class Time Calculation (min) 48 min            Core Videos: Exercise    Move It!  Clinical staff conducted group or individual video education with verbal and written material and guidebook.  Patient learns the recommended Pritikin exercise program. Exercise with the goal of living a long, healthy life. Some of the health benefits  of exercise include controlled diabetes, healthier blood pressure levels, improved cholesterol levels, improved heart and lung capacity, improved sleep, and better body composition. Everyone should speak with their doctor before starting or changing an exercise routine.  Biomechanical Limitations Clinical staff conducted group or individual video education with verbal and written material and guidebook.  Patient learns how biomechanical limitations can impact exercise and how we can mitigate and possibly overcome limitations to have an impactful and balanced exercise routine.  Body Composition Clinical staff conducted group or individual video education with verbal and written material and guidebook.  Patient learns that body composition (ratio of muscle mass to fat mass) is a key component to assessing overall fitness, rather than body weight alone. Increased fat mass, especially visceral belly fat, can put [place] at increased risk for metabolic syndrome, type 2 diabetes, heart disease, and even death. It is recommended to combine diet and exercise (cardiovascular and resistance training) to improve your body composition. Seek guidance from your physician and exercise physiologist before implementing an exercise routine.  Exercise Action Plan Clinical staff conducted group or individual video education with verbal and written material and guidebook.  Patient learns the recommended strategies to achieve and enjoy long-term exercise adherence, including variety, self-motivation, self-efficacy, and positive decision making. Benefits of exercise include fitness, good health, weight management, more energy, better sleep, less stress, and overall well-being.  Medical   Heart Disease Risk Reduction Clinical staff conducted group or individual video education with verbal and written material and guidebook.  Patient learns our heart is our most vital organ as it  circulates oxygen, nutrients, white blood cells,  and hormones throughout the entire body, and carries waste away. Data supports a plant-based eating plan like the Pritikin Program for its effectiveness in slowing progression of and reversing heart disease. The video provides a number of recommendations to address heart disease.   Metabolic Syndrome and Belly Fat  Clinical staff conducted group or individual video education with verbal and written material and guidebook.  Patient learns what metabolic syndrome is, how it leads to heart disease, and how one can reverse it and keep it from coming back. You have metabolic syndrome if you have 3 of the following 5 criteria: abdominal obesity, high blood pressure, high triglycerides, low HDL cholesterol, and high blood sugar.  Hypertension and Heart Disease Clinical staff conducted group or individual video education with verbal and written material and guidebook.  Patient learns that high blood pressure, or hypertension, is very common in the [place]. Hypertension is largely due to excessive salt intake, but other important risk factors include being overweight, physical inactivity, drinking too much alcohol, smoking, and not eating enough potassium from fruits and vegetables. High blood pressure is a leading risk factor for heart attack, stroke, congestive heart failure, dementia, kidney failure, and premature death. Long-term effects of excessive salt intake include stiffening of the arteries and thickening of heart muscle and organ damage. Recommendations include ways to reduce hypertension and the risk of heart disease.  Diseases of Our Time - Focusing on Diabetes Clinical staff conducted group or individual video education with verbal and written material and guidebook.  Patient learns why the best way to stop diseases of our time is prevention, through food and other lifestyle changes. Medicine (such as prescription pills and surgeries) is often only a Band-Aid on the problem, not a long-term  solution. Most common diseases of our time include obesity, type 2 diabetes, hypertension, heart disease, and cancer. The Pritikin Program is recommended and has been proven to help reduce, reverse, and/or prevent the damaging effects of metabolic syndrome.  Nutrition   Overview of the Pritikin Eating Plan  Clinical staff conducted group or individual video education with verbal and written material and guidebook.  Patient learns about the [place] for disease risk reduction. The [place] emphasizes a wide variety of unrefined, minimally-processed carbohydrates, like fruits, vegetables, whole grains, and legumes. Go, Caution, and Stop food choices are explained. Plant-based and lean animal proteins are emphasized. Rationale provided for low sodium intake for blood pressure control, low added sugars for blood sugar stabilization, and low added fats and oils for coronary artery disease risk reduction and weight management.  Calorie Density  Clinical staff conducted group or individual video education with verbal and written material and guidebook.  Patient learns about calorie density and how it impacts the Pritikin Eating Plan. Knowing the characteristics of the food you choose will help you decide whether those foods will lead to weight gain or weight loss, and whether you want to consume more or less of them. Weight loss is usually a side effect of the Pritikin Eating Plan because of its focus on low calorie-dense foods.  Label Reading  Clinical staff conducted group or individual video education with verbal and written material and guidebook.  Patient learns about the Pritikin recommended label reading guidelines and corresponding recommendations regarding calorie density, added sugars, sodium content, and whole grains.  Dining Out - Part 1  Clinical staff conducted group or individual video education with verbal and written material and  guidebook.  Patient learns that  restaurant meals can be sabotaging because they can be so high in calories, fat, sodium, and/or sugar. Patient learns recommended strategies on how to positively address this and avoid unhealthy pitfalls.  Facts on Fats  Clinical staff conducted group or individual video education with verbal and written material and guidebook.  Patient learns that lifestyle modifications can be just as effective, if not more so, as many medications for lowering your risk of heart disease. A Pritikin lifestyle can help to reduce your risk of inflammation and atherosclerosis (cholesterol build-up, or plaque, in the artery walls). Lifestyle interventions such as dietary choices and physical activity address the cause of atherosclerosis. A review of the types of fats and their impact on blood cholesterol levels, along with dietary recommendations to reduce fat intake is also included.  Nutrition Action Plan  Clinical staff conducted group or individual video education with verbal and written material and guidebook.  Patient learns how to incorporate Pritikin recommendations into their lifestyle. Recommendations include planning and keeping personal health goals in mind as an important part of their success.  Healthy Mind-Set    Healthy Minds, Bodies, Hearts  Clinical staff conducted group or individual video education with verbal and written material and guidebook.  Patient learns how to identify when they are stressed. Video will discuss the impact of that stress, as well as the many benefits of stress management. Patient will also be introduced to stress management techniques. The way we think, act, and feel has an impact on our hearts.  How Our Thoughts Can Heal Our Hearts  Clinical staff conducted group or individual video education with verbal and written material and guidebook.  Patient learns that negative thoughts can cause depression and anxiety. This can result in negative lifestyle behavior and serious  health problems. Cognitive behavioral therapy is an effective method to help control our thoughts in order to change and improve our emotional outlook.  Additional Videos:  Exercise    Improving Performance  Clinical staff conducted group or individual video education with verbal and written material and guidebook.  Patient learns to use a non-linear approach by alternating intensity levels and lengths of time spent exercising to help burn more calories and lose more body fat. Cardiovascular exercise helps improve heart health, metabolism, hormonal balance, blood sugar control, and recovery from fatigue. Resistance training improves strength, endurance, balance, coordination, reaction time, metabolism, and muscle mass. Flexibility exercise improves circulation, posture, and balance. Seek guidance from your physician and exercise physiologist before implementing an exercise routine and learn your capabilities and proper form for all exercise.  Introduction to Yoga  Clinical staff conducted group or individual video education with verbal and written material and guidebook.  Patient learns about yoga, a discipline of the coming together of mind, breath, and body. The benefits of yoga include improved flexibility, improved range of motion, better posture and core strength, increased lung function, weight loss, and positive self-image. Yoga's heart health benefits include lowered blood pressure, healthier heart rate, decreased cholesterol and triglyceride levels, improved immune function, and reduced stress. Seek guidance from your physician and exercise physiologist before implementing an exercise routine and learn your capabilities and proper form for all exercise.  Medical   Aging: Enhancing Your Quality of Life  Clinical staff conducted group or individual video education with verbal and written material and guidebook.  Patient learns key strategies and recommendations to stay in good physical health  and enhance quality of life, such as prevention strategies,  having an advocate, securing a [place], and keeping a list of medications and system for tracking them. It also discusses how to avoid risk for bone loss.  Biology of Weight Control  Clinical staff conducted group or individual video education with verbal and written material and guidebook.  Patient learns that weight gain occurs because we consume more calories than we burn (eating more, moving less). Even if your body weight is normal, you may have higher ratios of fat compared to muscle mass. Too much body fat puts you at increased risk for cardiovascular disease, heart attack, stroke, type 2 diabetes, and obesity-related cancers. In addition to exercise, following the [place] can help reduce your risk.  Decoding Lab Results  Clinical staff conducted group or individual video education with verbal and written material and guidebook.  Patient learns that lab test reflects one measurement whose values change over time and are influenced by many factors, including medication, stress, sleep, exercise, food, hydration, pre-existing medical conditions, and more. It is recommended to use the knowledge from this video to become more involved with your lab results and evaluate your numbers to speak with your doctor.   Diseases of Our Time - Overview  Clinical staff conducted group or individual video education with verbal and written material and guidebook.  Patient learns that according to the CDC, 50% to 70% of chronic diseases (such as obesity, type 2 diabetes, elevated lipids, hypertension, and heart disease) are avoidable through lifestyle improvements including healthier food choices, listening to satiety cues, and increased physical activity.  Sleep Disorders Clinical staff conducted group or individual video education with verbal and written material and guidebook.  Patient learns how good  quality and duration of sleep are important to overall health and well-being. Patient also learns about sleep disorders and how they impact health along with recommendations to address them, including discussing with a physician.  Nutrition  Dining Out - Part 2 Clinical staff conducted group or individual video education with verbal and written material and guidebook.  Patient learns how to plan ahead and communicate in order to maximize their dining experience in a healthy and nutritious manner. Included are recommended food choices based on the type of restaurant the patient is visiting.   Fueling a Best boy conducted group or individual video education with verbal and written material and guidebook.  There is a strong connection between our food choices and our health. Diseases like obesity and type 2 diabetes are very prevalent and are in large-part due to lifestyle choices. The Pritikin Eating Plan provides plenty of food and hunger-curbing satisfaction. It is easy to follow, affordable, and helps reduce health risks.  Menu Workshop  Clinical staff conducted group or individual video education with verbal and written material and guidebook.  Patient learns that restaurant meals can sabotage health goals because they are often packed with calories, fat, sodium, and sugar. Recommendations include strategies to plan ahead and to communicate with the manager, chef, or server to help order a healthier meal.  Planning Your Eating Strategy  Clinical staff conducted group or individual video education with verbal and written material and guidebook.  Patient learns about the [place] and its benefit of reducing the risk of disease. The [place] does not focus on calories. Instead, it emphasizes high-quality, nutrient-rich foods. By knowing the characteristics of the foods, we choose, we can determine their calorie density and make informed  decisions.  Targeting Your Nutrition Priorities  Clinical staff conducted group or individual video education with verbal and written material and guidebook.  Patient learns that lifestyle habits have a tremendous impact on disease risk and progression. This video provides eating and physical activity recommendations based on your personal health goals, such as reducing LDL cholesterol, losing weight, preventing or controlling type 2 diabetes, and reducing high blood pressure.  Vitamins and Minerals  Clinical staff conducted group or individual video education with verbal and written material and guidebook.  Patient learns different ways to obtain key vitamins and minerals, including through a recommended healthy diet. It is important to discuss all supplements you take with your doctor.   Healthy Mind-Set    Smoking Cessation  Clinical staff conducted group or individual video education with verbal and written material and guidebook.  Patient learns that cigarette smoking and tobacco addiction pose a serious health risk which affects millions of people. Stopping smoking will significantly reduce the risk of heart disease, lung disease, and many forms of cancer. Recommended strategies for quitting are covered, including working with your doctor to develop a successful plan.  Culinary   Becoming a Financial trader conducted group or individual video education with verbal and written material and guidebook.  Patient learns that cooking at home can be healthy, cost-effective, quick, and puts them in control. Keys to cooking healthy recipes will include looking at your recipe, assessing your equipment needs, planning ahead, making it simple, choosing cost-effective seasonal ingredients, and limiting the use of added fats, salts, and sugars.  Cooking - Breakfast and Snacks  Clinical staff conducted group or individual video education with verbal and written material and guidebook.   Patient learns how important breakfast is to satiety and nutrition through the entire day. Recommendations include key foods to eat during breakfast to help stabilize blood sugar levels and to prevent overeating at meals later in the day. Planning ahead is also a key component.  Cooking - Human resources officer conducted group or individual video education with verbal and written material and guidebook.  Patient learns eating strategies to improve overall health, including an approach to cook more at home. Recommendations include thinking of animal protein as a side on your plate rather than center stage and focusing instead on lower calorie dense options like vegetables, fruits, whole grains, and plant-based proteins, such as beans. Making sauces in large quantities to freeze for later and leaving the skin on your vegetables are also recommended to maximize your experience.  Cooking - Healthy Salads and Dressing Clinical staff conducted group or individual video education with verbal and written material and guidebook.  Patient learns that vegetables, fruits, whole grains, and legumes are the foundations of the [place]. Recommendations include how to incorporate each of these in flavorful and healthy salads, and how to create homemade salad dressings. Proper handling of ingredients is also covered. Cooking - Soups and [employer] - Soups and Desserts Clinical staff conducted group or individual video education with verbal and written material and guidebook.  Patient learns that Pritikin soups and desserts make for easy, nutritious, and delicious snacks and meal components that are low in sodium, fat, sugar, and calorie density, while high in vitamins, minerals, and filling fiber. Recommendations include simple and healthy ideas for soups and desserts.   Overview     The Pritikin Solution Program Overview Clinical staff conducted group or individual video education with  verbal and written material and  guidebook.  Patient learns that the results of the [place] Program have been documented in more than 100 articles published in peer-reviewed journals, and the benefits include reducing risk factors for (and, in some cases, even reversing) high cholesterol, high blood pressure, type 2 diabetes, obesity, and more! An overview of the three key pillars of the Pritikin Program will be covered: eating well, doing regular exercise, and having a healthy mind-set.  WORKSHOPS  Exercise: Exercise Basics: Building Your Action Plan Clinical staff led group instruction and group discussion with PowerPoint presentation and patient guidebook. To enhance the learning environment the use of posters, models and videos may be added. At the conclusion of this workshop, patients will comprehend the difference between physical activity and exercise, as well as the benefits of incorporating both, into their routine. Patients will understand the FITT (Frequency, Intensity, Time, and Type) principle and how to use it to build an exercise action plan. In addition, safety concerns and other considerations for exercise and cardiac rehab will be addressed by the presenter. The purpose of this lesson is to promote a comprehensive and effective weekly exercise routine in order to improve patients' overall level of fitness.   Managing Heart Disease: Your Path to a Healthier Heart Clinical staff led group instruction and group discussion with PowerPoint presentation and patient guidebook. To enhance the learning environment the use of posters, models and videos may be added.At the conclusion of this workshop, patients will understand the anatomy and physiology of the heart. Additionally, they will understand how Pritikin's three pillars impact the risk factors, the progression, and the management of heart disease.  The purpose of this lesson is to provide a high-level overview of the heart, heart  disease, and how the Pritikin lifestyle positively impacts risk factors.  Exercise Biomechanics Clinical staff led group instruction and group discussion with PowerPoint presentation and patient guidebook. To enhance the learning environment the use of posters, models and videos may be added. Patients will learn how the structural parts of their bodies function and how these functions impact their daily activities, movement, and exercise. Patients will learn how to promote a neutral spine, learn how to manage pain, and identify ways to improve their physical movement in order to promote healthy living. The purpose of this lesson is to expose patients to common physical limitations that impact physical activity. Participants will learn practical ways to adapt and manage aches and pains, and to minimize their effect on regular exercise. Patients will learn how to maintain good posture while sitting, walking, and lifting.  Balance Training and Fall Prevention  Clinical staff led group instruction and group discussion with PowerPoint presentation and patient guidebook. To enhance the learning environment the use of posters, models and videos may be added. At the conclusion of this workshop, patients will understand the importance of their sensorimotor skills (vision, proprioception, and the vestibular system) in maintaining their ability to balance as they age. Patients will apply a variety of balancing exercises that are appropriate for their current level of function. Patients will understand the common causes for poor balance, possible solutions to these problems, and ways to modify their physical environment in order to minimize their fall risk. The purpose of this lesson is to teach patients about the importance of maintaining balance as they age and ways to minimize their risk of falling.  WORKSHOPS   Nutrition:  Fueling a Scientist, research (physical sciences) led group instruction and group  discussion with PowerPoint presentation and patient guidebook. To enhance  the learning environment the use of posters, models and videos may be added. Patients will review the foundational principles of the [place] and understand what constitutes a serving size in each of the food groups. Patients will also learn Pritikin-friendly foods that are better choices when away from home and review make-ahead meal and snack options. Calorie density will be reviewed and applied to three nutrition priorities: weight maintenance, weight loss, and weight gain. The purpose of this lesson is to reinforce (in a group setting) the key concepts around what patients are recommended to eat and how to apply these guidelines when away from home by planning and selecting Pritikin-friendly options. Patients will understand how calorie density may be adjusted for different weight management goals.  Mindful Eating  Clinical staff led group instruction and group discussion with PowerPoint presentation and patient guidebook. To enhance the learning environment the use of posters, models and videos may be added. Patients will briefly review the concepts of the [place] and the importance of low-calorie dense foods. The concept of mindful eating will be introduced as well as the importance of paying attention to internal hunger signals. Triggers for non-hunger eating and techniques for dealing with triggers will be explored. The purpose of this lesson is to provide patients with the opportunity to review the basic principles of the [place], discuss the value of eating mindfully and how to measure internal cues of hunger and fullness using the Hunger Scale. Patients will also discuss reasons for non-hunger eating and learn strategies to use for controlling emotional eating.  Targeting Your Nutrition Priorities Clinical staff led group instruction and group discussion with PowerPoint presentation and  patient guidebook. To enhance the learning environment the use of posters, models and videos may be added. Patients will learn how to determine their genetic susceptibility to disease by reviewing their family history. Patients will gain insight into the importance of diet as part of an overall healthy lifestyle in mitigating the impact of genetics and other environmental insults. The purpose of this lesson is to provide patients with the opportunity to assess their personal nutrition priorities by looking at their family history, their own health history and current risk factors. Patients will also be able to discuss ways of prioritizing and modifying the [place] for their highest risk areas  Menu  Clinical staff led group instruction and group discussion with PowerPoint presentation and patient guidebook. To enhance the learning environment the use of posters, models and videos may be added. Using menus brought in from [employer], or printed from [employer], patients will apply the [place] dining out guidelines that were presented in the [employer] video. Patients will also be able to practice these guidelines in a variety of provided scenarios. The purpose of this lesson is to provide patients with the opportunity to practice hands-on learning of the [place] with actual menus and practice scenarios.  Label Reading Clinical staff led group instruction and group discussion with PowerPoint presentation and patient guidebook. To enhance the learning environment the use of posters, models and videos may be added. Patients will review and discuss the Pritikin label reading guidelines presented in Pritikin's Label Reading Educational series video. Using fool labels brought in from local grocery stores and markets, patients will apply the label reading guidelines and determine if the packaged food meet the Pritikin guidelines. The purpose of this  lesson is to provide patients with the opportunity to review, discuss,  and practice hands-on learning of the Pritikin Label Reading guidelines with actual packaged food labels. [place] Workshops are designed to teach patients ways to prepare quick, simple, and affordable recipes at home. The importance of nutrition's role in chronic disease risk reduction is reflected in its emphasis in the overall Pritikin program. By learning how to prepare essential core Pritikin Eating Plan recipes, patients will increase control over what they eat; be able to customize the flavor of foods without the use of added salt, sugar, or fat; and improve the quality of the food they consume. By learning a set of core recipes which are easily assembled, quickly prepared, and affordable, patients are more likely to prepare more healthy foods at home. These workshops focus on convenient breakfasts, simple entres, side dishes, and desserts which can be prepared with minimal effort and are consistent with nutrition recommendations for cardiovascular risk reduction. Cooking [employer] are taught by a Engineer, materials (RD) who has been trained by the [employer]. The chef or RD has a clear understanding of the importance of minimizing - if not completely eliminating - added fat, sugar, and sodium in recipes. Throughout the series of [place] Workshop sessions, patients will learn about healthy ingredients and efficient methods of cooking to build confidence in their capability to prepare    Cooking School weekly topics:  Adding Flavor- Sodium-Free  Fast and Healthy Breakfasts  Powerhouse Plant-Based Proteins  Satisfying Salads and Dressings  Simple Sides and Sauces  International Cuisine-Spotlight on the [place] Zones  Delicious Desserts  Savory Soups  [employer] - Meals in a Agricultural consultant Appetizers and Snacks  Comforting Weekend Breakfasts  One-Pot  Wonders   Fast Evening Meals  Contractor Your Pritikin Plate  WORKSHOPS   Healthy Mindset (Psychosocial):  Focused Goals, Sustainable Changes Clinical staff led group instruction and group discussion with PowerPoint presentation and patient guidebook. To enhance the learning environment the use of posters, models and videos may be added. Patients will be able to apply effective goal setting strategies to establish at least one personal goal, and then take consistent, meaningful action toward that goal. They will learn to identify common barriers to achieving personal goals and develop strategies to overcome them. Patients will also gain an understanding of how our mind-set can impact our ability to achieve goals and the importance of cultivating a positive and growth-oriented mind-set. The purpose of this lesson is to provide patients with a deeper understanding of how to set and achieve personal goals, as well as the tools and strategies needed to overcome common obstacles which may arise along the way.  From Head to Heart: The Power of a Healthy Outlook  Clinical staff led group instruction and group discussion with PowerPoint presentation and patient guidebook. To enhance the learning environment the use of posters, models and videos may be added. Patients will be able to recognize and describe the impact of emotions and mood on physical health. They will discover the importance of self-care and explore self-care practices which may work for them. Patients will also learn how to utilize the 4 C's to cultivate a healthier outlook and better manage stress and challenges. The purpose of this lesson is to demonstrate to patients how a healthy outlook is an essential part of maintaining good health, especially as they continue their cardiac rehab journey.  Healthy Sleep for a Healthy Heart Clinical staff led group instruction and group discussion with  PowerPoint presentation and  patient guidebook. To enhance the learning environment the use of posters, models and videos may be added. At the conclusion of this workshop, patients will be able to demonstrate knowledge of the importance of sleep to overall health, well-being, and quality of life. They will understand the symptoms of, and treatments for, common sleep disorders. Patients will also be able to identify daytime and nighttime behaviors which impact sleep, and they will be able to apply these tools to help manage sleep-related challenges. The purpose of this lesson is to provide patients with a general overview of sleep and outline the importance of quality sleep. Patients will learn about a few of the most common sleep disorders. Patients will also be introduced to the concept of "sleep hygiene," and discover ways to self-manage certain sleeping problems through simple daily behavior changes. Finally, the workshop will motivate patients by clarifying the links between quality sleep and their goals of heart-healthy living.   Recognizing and Reducing Stress Clinical staff led group instruction and group discussion with PowerPoint presentation and patient guidebook. To enhance the learning environment the use of posters, models and videos may be added. At the conclusion of this workshop, patients will be able to understand the types of stress reactions, differentiate between acute and chronic stress, and recognize the impact that chronic stress has on their health. They will also be able to apply different coping mechanisms, such as reframing negative self-talk. Patients will have the opportunity to practice a variety of stress management techniques, such as deep abdominal breathing, progressive muscle relaxation, and/or guided imagery.  The purpose of this lesson is to educate patients on the role of stress in their lives and to provide healthy techniques for coping with it.  Learning Barriers/Preferences:  Learning  Barriers/Preferences - 01/05/22 1458       Learning Barriers/Preferences   Learning Barriers Sight   wears glasses   Learning Preferences Computer/Internet;Audio;Group Instruction;Individual Instruction;Pictoral;Skilled Demonstration;Verbal Instruction;Video;Written Material             Education Topics:  Knowledge Questionnaire Score:  Knowledge Questionnaire Score - 01/05/22 1458       Knowledge Questionnaire Score   Pre Score 20/24             Core Components/Risk Factors/Patient Goals at Admission:  Personal Goals and Risk Factors at Admission - 01/05/22 1458       Core Components/Risk Factors/Patient Goals on Admission    Weight Management Yes    Intervention Weight Management: Develop a combined nutrition and exercise program designed to reach desired caloric intake, while maintaining appropriate intake of nutrient and fiber, sodium and fats, and appropriate energy expenditure required for the weight goal.;Weight Management: Provide education and appropriate resources to help participant work on and attain dietary goals.    Expected Outcomes Short Term: Continue to assess and modify interventions until short term weight is achieved;Long Term: Adherence to nutrition and physical activity/exercise program aimed toward attainment of established weight goal;Understanding recommendations for meals to include 15-35% energy as protein, 25-35% energy from fat, 35-60% energy from carbohydrates, less than 200mg  of dietary cholesterol, 20-35 gm of total fiber daily;Understanding of distribution of calorie intake throughout the day with the consumption of 4-5 meals/snacks    Heart Failure Yes    Intervention Provide a combined exercise and nutrition program that is supplemented with education, support and counseling about heart failure. Directed toward relieving symptoms such as shortness of breath, decreased exercise tolerance, and extremity edema.    Expected  Outcomes Improve  functional capacity of life;Short term: Attendance in program 2-3 days a week with increased exercise capacity. Reported lower sodium intake. Reported increased fruit and vegetable intake. Reports medication compliance.;Short term: Daily weights obtained and reported for increase. Utilizing diuretic protocols set by physician.;Long term: Adoption of self-care skills and reduction of barriers for early signs and symptoms recognition and intervention leading to self-care maintenance.    Hypertension Yes    Intervention Provide education on lifestyle modifcations including regular physical activity/exercise, weight management, moderate sodium restriction and increased consumption of fresh fruit, vegetables, and low fat dairy, alcohol moderation, and smoking cessation.;Monitor prescription use compliance.    Expected Outcomes Short Term: Continued assessment and intervention until BP is < 140/76mm HG in hypertensive participants. < 130/57mm HG in hypertensive participants with diabetes, heart failure or chronic kidney disease.;Long Term: Maintenance of blood pressure at goal levels.    Lipids Yes    Intervention Provide education and support for participant on nutrition & aerobic/resistive exercise along with prescribed medications to achieve LDL 70mg , HDL >40mg .    Expected Outcomes Short Term: Participant states understanding of desired cholesterol values and is compliant with medications prescribed. Participant is following exercise prescription and nutrition guidelines.;Long Term: Cholesterol controlled with medications as prescribed, with individualized exercise RX and with personalized nutrition plan. Value goals: LDL < 70mg , HDL > 40 mg.             Core Components/Risk Factors/Patient Goals Review:   Goals and Risk Factor Review     Row Name 01/27/22 1540 02/15/22 1308 03/14/22 1103 04/05/22 1736       Core Components/Risk Factors/Patient Goals Review   Personal Goals Review Weight  Management/Obesity;Hypertension;Lipids;Heart Failure Weight Management/Obesity;Hypertension;Lipids;Heart Failure Weight Management/Obesity;Hypertension;Lipids;Heart Failure Weight Management/Obesity;Hypertension;Lipids;Heart Failure    Review Kendyn started intensive cardiac rehab on 01/16/22. Roney did well with exercise. Ed is doing well with exercise at intensive cardiac rehab. Ed's vital signs have been stable. Ed has lost 2.7 kg since starting cardiac rehab Ed is doing well with exercise at intensive cardiac rehab. Ed's vital signs have been stable. Ed has increased his workloads  Ed has maintianed his weight  loss 2.7 kg since starting cardiac rehab. Ed will complete intensive cardiac rehab on 03/24/22 Ed is doing well with exercise at intensive cardiac rehab. Ed's vital signs have been stable. Ed has increased his workloads  Ed has maintianed his weight  loss 3.7 kg since starting cardiac rehab. Ed will complete intensive cardiac rehab at the end of the month    Expected Outcomes Rayen will continue to participate in intensive cardiac rehab for exercise, nutrition and lifestyle modifications Gerrid will continue to participate in intensive cardiac rehab for exercise, nutrition and lifestyle modifications [place] will continue to participate in intensive cardiac rehab for exercise, nutrition and lifestyle modifications [place] will continue to participate in intensive cardiac rehab for exercise, nutrition and lifestyle modifications             Core Components/Risk Factors/Patient Goals at Discharge (Final Review):   Goals and Risk Factor Review - 04/05/22 1736       Core Components/Risk Factors/Patient Goals Review   Personal Goals Review Weight Management/Obesity;Hypertension;Lipids;Heart Failure    Review Ed is doing well with exercise at intensive cardiac rehab. Ed's vital signs have been stable. Ed has increased his workloads  Ed has maintianed his weight  loss 3.7 kg since starting cardiac  rehab. Ed will complete intensive cardiac rehab at the end of the month  Expected Outcomes Yorman will continue to participate in intensive cardiac rehab for exercise, nutrition and lifestyle modifications             ITP Comments:  ITP Comments     Row Name 01/05/22 1536 01/17/22 0857 01/27/22 1538 02/15/22 1306 03/14/22 1101   ITP Comments Dr Fransico Him MD, Medical Director, Introduction to Pritikin Education Program/ Intensive cardiac rehab. Inital orientatin packet reviewed with the patient 30 Day ITP Review. Alex Arias attended cardiac rehab on 01/05/22 and is scheduled to begin intensive cardiac rehab on 01/27/22. 30 Day ITP Review. Ed started intensive cardiac rehab on 01/27/22 and did well with exercise 30 Day ITP Review. Ed has good participation in intensive cardiac rehab when in attendance. 30 Day ITP Review. Ed has good participation in intensive cardiac rehab when in attendance. Ed will complete intensive cardiac rehab on 03/24/22    Row Name 04/05/22 1735           ITP Comments 30 Day ITP Review. Ed has good participation in intensive cardiac rehab when in attendance. Ed will complete intensive cardiac rehab at the end of the month                Comments: See ITP comments.Alex Gave RN BSN

## 2022-04-06 NOTE — Telephone Encounter (Signed)
Eliquis 5mg  refill request received. Patient is 67 years old, weight-99.8kg, Crea-1.22 on 01/27/22, Diagnosis-Afib, and last seen by Dr. Gasper Sells on 01/27/22. Dose is appropriate based on dosing criteria. Will send in refill to requested pharmacy.

## 2022-04-07 ENCOUNTER — Encounter (HOSPITAL_COMMUNITY)
Admission: RE | Admit: 2022-04-07 | Discharge: 2022-04-07 | Disposition: A | Discharge status: Home / Self Care | Payer: BC Managed Care – PPO | Source: Ambulatory Visit | Attending: Internal Medicine | Admitting: Internal Medicine

## 2022-04-07 DIAGNOSIS — I252 Old myocardial infarction: Secondary | ICD-10-CM | POA: Diagnosis not present

## 2022-04-07 DIAGNOSIS — Z48812 Encounter for surgical aftercare following surgery on the circulatory system: Secondary | ICD-10-CM | POA: Diagnosis not present

## 2022-04-07 DIAGNOSIS — Z955 Presence of coronary angioplasty implant and graft: Secondary | ICD-10-CM | POA: Diagnosis not present

## 2022-04-07 DIAGNOSIS — I214 Non-ST elevation (NSTEMI) myocardial infarction: Secondary | ICD-10-CM

## 2022-04-10 ENCOUNTER — Encounter (HOSPITAL_COMMUNITY)
Admission: RE | Admit: 2022-04-10 | Discharge: 2022-04-10 | Disposition: A | Discharge status: Home / Self Care | Payer: BC Managed Care – PPO | Source: Ambulatory Visit | Attending: Internal Medicine | Admitting: Internal Medicine

## 2022-04-10 VITALS — Ht 75.5 in | Wt 216.1 lb

## 2022-04-10 DIAGNOSIS — Z48812 Encounter for surgical aftercare following surgery on the circulatory system: Secondary | ICD-10-CM | POA: Diagnosis not present

## 2022-04-10 DIAGNOSIS — I214 Non-ST elevation (NSTEMI) myocardial infarction: Secondary | ICD-10-CM | POA: Diagnosis not present

## 2022-04-10 DIAGNOSIS — Z955 Presence of coronary angioplasty implant and graft: Secondary | ICD-10-CM

## 2022-04-10 DIAGNOSIS — I252 Old myocardial infarction: Secondary | ICD-10-CM | POA: Diagnosis not present

## 2022-04-11 ENCOUNTER — Other Ambulatory Visit (HOSPITAL_COMMUNITY): Payer: Self-pay

## 2022-04-11 MED ORDER — REPATHA SURECLICK 140 MG/ML ~~LOC~~ SOAJ
140.0000 mg | SUBCUTANEOUS | 0 refills | Status: DC
  Filled 2022-04-11: qty 12, 168d supply, fill #0

## 2022-04-12 ENCOUNTER — Encounter: Payer: BC Managed Care – PPO | Admitting: *Deleted

## 2022-04-12 ENCOUNTER — Encounter (HOSPITAL_COMMUNITY)
Admission: RE | Admit: 2022-04-12 | Discharge: 2022-04-12 | Disposition: A | Discharge status: Home / Self Care | Payer: BC Managed Care – PPO | Source: Ambulatory Visit | Attending: Internal Medicine | Admitting: Internal Medicine

## 2022-04-12 ENCOUNTER — Other Ambulatory Visit (HOSPITAL_COMMUNITY): Payer: Self-pay

## 2022-04-12 DIAGNOSIS — Z955 Presence of coronary angioplasty implant and graft: Secondary | ICD-10-CM

## 2022-04-12 DIAGNOSIS — Z006 Encounter for examination for normal comparison and control in clinical research program: Secondary | ICD-10-CM

## 2022-04-12 DIAGNOSIS — I252 Old myocardial infarction: Secondary | ICD-10-CM | POA: Diagnosis not present

## 2022-04-12 DIAGNOSIS — I214 Non-ST elevation (NSTEMI) myocardial infarction: Secondary | ICD-10-CM

## 2022-04-12 DIAGNOSIS — Z48812 Encounter for surgical aftercare following surgery on the circulatory system: Secondary | ICD-10-CM | POA: Diagnosis not present

## 2022-04-13 NOTE — Research (Signed)
24 Week Follow-Up Visit Completed*   []  Not Necessary, No Potential Adverse Events Or Medication Issues Reported On Completed Subject Questionnaire   [x]  Yes, Contact With Subject/Alternate Contact Completed   []  Yes, No Contact With Subject/Alternate Contact Completed, But Electronic Health Record Was Reviewed   []  No, Unable To Contact Subject/Alternate Contact   Have you reviewed Ongoing medications on the Targeted Concomitant Medication form and updated the form as needed?   [x]  Yes   []  No   Subject Status*   [x]  Continuing In Follow-up   []  At Risk For Lost To Follow-up   []  Withdrawal From All Future Study Activities Including Passive Follow-up By [place] Record Review Or Contact With Healthcare Provider Or Family Member/Friend   []  Death   Vital Status*   [x]  Alive   []  Deceased   []  Unknown   Last Known To Be Alive Source*   [x]  Subject Completed Follow-up Questionnaire/Seen In Person/Via Telephone Contact   []  Family Member or Caretaker   []  Primary Physician Or Medical Records   []  Publicly Available Source   []  Other  Date of last dose taken   11-Apr-2022  Over the last 12 weeks did the subject miss any doses? no  Over the last 12 weeks did the subject restart Evolocumab after an interruption? no

## 2022-04-14 ENCOUNTER — Encounter (HOSPITAL_COMMUNITY)
Admission: RE | Admit: 2022-04-14 | Discharge: 2022-04-14 | Disposition: A | Discharge status: Home / Self Care | Payer: BC Managed Care – PPO | Source: Ambulatory Visit | Attending: Internal Medicine | Admitting: Internal Medicine

## 2022-04-14 DIAGNOSIS — Z48812 Encounter for surgical aftercare following surgery on the circulatory system: Secondary | ICD-10-CM | POA: Diagnosis not present

## 2022-04-14 DIAGNOSIS — Z955 Presence of coronary angioplasty implant and graft: Secondary | ICD-10-CM

## 2022-04-14 DIAGNOSIS — I214 Non-ST elevation (NSTEMI) myocardial infarction: Secondary | ICD-10-CM | POA: Diagnosis not present

## 2022-04-14 DIAGNOSIS — I252 Old myocardial infarction: Secondary | ICD-10-CM | POA: Diagnosis not present

## 2022-04-14 NOTE — Progress Notes (Signed)
Intermittent PVC's noted. Patient asymptomatic. Vital signs stable. Diona Browner NP onsite provider notified. Patient instructed to watch excessive caffeine intake per Raquel Sarna.Harrell Gave RN BSN

## 2022-04-14 NOTE — Progress Notes (Signed)
Discharge Progress Report  Patient Details  Name: Syaire Saber MRN: [id number] Date of Birth: 1955-07-21 Referring Provider:   Flowsheet Row INTENSIVE CARDIAC REHAB ORIENT from 01/05/2022 in [facility]  Referring Provider Bryan Lemma, MD        Number of Visits: 23  Reason for Discharge:  Patient reached a stable level of exercise. Patient independent in their exercise. Patient has met program and personal goals.  Smoking History:  Social History   Tobacco Use  Smoking Status Never  Smokeless Tobacco Never    Diagnosis:  10/30/21 NSTEMI  10/30/21 S/P DES LAD  ADL UCSD:   Initial Exercise Prescription:  Initial Exercise Prescription - 01/05/22 1400       Date of Initial Exercise RX and Referring Provider   Date 01/05/22    Referring Provider Bryan Lemma, MD    Expected Discharge Date 03/03/22      NuStep   Level 3    SPM 75    Minutes 15    METs 3      Elliptical   Level 1    Minutes 15    METs 3      Prescription Details   Frequency (times per week) 3    Duration Progress to 30 minutes of continuous aerobic without signs/symptoms of physical distress      Intensity   THRR 40-80% of Max Heartrate 60-121    Ratings of Perceived Exertion 11-13    Perceived Dyspnea 0-4      Progression   Progression Continue progressive overload as per policy without signs/symptoms or physical distress.      Resistance Training   Training Prescription Yes    Weight 4.    Reps 10-15             Discharge Exercise Prescription (Final Exercise Prescription Changes):  Exercise Prescription Changes - 04/14/22 1640       Response to Exercise   Blood Pressure (Admit) 112/60    Blood Pressure (Exercise) 140/70    Blood Pressure (Exit) 108/64    Heart Rate (Admit) 60 bpm    Heart Rate (Exercise) 106 bpm    Heart Rate (Exit) 69 bpm    Rating of Perceived Exertion (Exercise) 11    Perceived  Dyspnea (Exercise) 0    Symptoms none    Comments Pt graduated the [facility] program    Duration Progress to 30 minutes of  aerobic without signs/symptoms of physical distress    Intensity THRR unchanged      Progression   Progression Continue to progress workloads to maintain intensity without signs/symptoms of physical distress.    Average METs 5.05      Resistance Training   Training Prescription Yes    Weight 4 lbs wts    Reps 10-15    Time 10 Minutes      Recumbant Elliptical   Level 6    Watts 113    Minutes 15    METs 4.3      Elliptical   Level 4    Speed 1    Minutes 15    METs 5.8      Home Exercise Plan   Plans to continue exercise at Home (comment)    Frequency Add 2 additional days to program exercise sessions.    Initial Home Exercises Provided 02/17/22             Functional Capacity:  6 Minute Walk  Row Name 01/05/22 1442 04/10/22 1653       6 Minute Walk   Phase Initial Discharge    Distance 1628 feet 2323 feet    Distance % Change -- 42.69 %    Distance Feet Change -- 695 ft    Walk Time 6 minutes 6 minutes    # of Rest Breaks 0 0    MPH 3.08 4.4    METS 3.41 5.03    RPE 11 10    Perceived Dyspnea  0 0    VO2 Peak 11.94 17.62    Symptoms No No    Resting HR 47 bpm 52 bpm    Resting BP 116/78 112/74    Resting Oxygen Saturation  99 % --    Exercise Oxygen Saturation  during 6 min walk 100 % --    Max Ex. HR 76 bpm 108 bpm    Max Ex. BP 122/80 128/68    2 Minute Post BP 116/76 --             Psychological, QOL, Others - Outcomes: PHQ 2/9:    04/14/2022    3:32 PM 01/05/2022    3:40 PM  Depression screen PHQ 2/9  Decreased Interest 0 0  Down, Depressed, Hopeless 0 0  PHQ - 2 Score 0 0    Quality of Life:  Quality of Life - 04/12/22 1651       Quality of Life Scores   Health/Function Post 28 %    Socioeconomic Post 27.14 %    Psych/Spiritual Post 28.57 %    Family Post 27.6 %    GLOBAL Post 27.88 %              Personal Goals: Goals established at orientation with interventions provided to work toward goal.  Personal Goals and Risk Factors at Admission - 01/05/22 1458       Core Components/Risk Factors/Patient Goals on Admission    Weight Management Yes    Intervention Weight Management: Develop a combined nutrition and exercise program designed to reach desired caloric intake, while maintaining appropriate intake of nutrient and fiber, sodium and fats, and appropriate energy expenditure required for the weight goal.;Weight Management: Provide education and appropriate resources to help participant work on and attain dietary goals.    Expected Outcomes Short Term: Continue to assess and modify interventions until short term weight is achieved;Long Term: Adherence to nutrition and physical activity/exercise program aimed toward attainment of established weight goal;Understanding recommendations for meals to include 15-35% energy as protein, 25-35% energy from fat, 35-60% energy from carbohydrates, less than 200mg  of dietary cholesterol, 20-35 gm of total fiber daily;Understanding of distribution of calorie intake throughout the day with the consumption of 4-5 meals/snacks    Heart Failure Yes    Intervention Provide a combined exercise and nutrition program that is supplemented with education, support and counseling about heart failure. Directed toward relieving symptoms such as shortness of breath, decreased exercise tolerance, and extremity edema.    Expected Outcomes Improve functional capacity of life;Short term: Attendance in program 2-3 days a week with increased exercise capacity. Reported lower sodium intake. Reported increased fruit and vegetable intake. Reports medication compliance.;Short term: Daily weights obtained and reported for increase. Utilizing diuretic protocols set by physician.;Long term: Adoption of self-care skills and reduction of barriers for early signs and symptoms  recognition and intervention leading to self-care maintenance.    Hypertension Yes    Intervention Provide education on lifestyle modifcations including regular  physical activity/exercise, weight management, moderate sodium restriction and increased consumption of fresh fruit, vegetables, and low fat dairy, alcohol moderation, and smoking cessation.;Monitor prescription use compliance.    Expected Outcomes Short Term: Continued assessment and intervention until BP is < 140/110mm HG in hypertensive participants. < 130/65mm HG in hypertensive participants with diabetes, heart failure or chronic kidney disease.;Long Term: Maintenance of blood pressure at goal levels.    Lipids Yes    Intervention Provide education and support for participant on nutrition & aerobic/resistive exercise along with prescribed medications to achieve LDL 70mg , HDL >40mg .    Expected Outcomes Short Term: Participant states understanding of desired cholesterol values and is compliant with medications prescribed. Participant is following exercise prescription and nutrition guidelines.;Long Term: Cholesterol controlled with medications as prescribed, with individualized exercise RX and with personalized nutrition plan. Value goals: LDL < 70mg , HDL > 40 mg.              Personal Goals Discharge:  Goals and Risk Factor Review     Row Name 01/27/22 1540 02/15/22 1308 03/14/22 1103 04/05/22 1736       Core Components/Risk Factors/Patient Goals Review   Personal Goals Review Weight Management/Obesity;Hypertension;Lipids;Heart Failure Weight Management/Obesity;Hypertension;Lipids;Heart Failure Weight Management/Obesity;Hypertension;Lipids;Heart Failure Weight Management/Obesity;Hypertension;Lipids;Heart Failure    Review Saagar started intensive cardiac rehab on 01/16/22. Zishan did well with exercise. Ed is doing well with exercise at intensive cardiac rehab. Ed's vital signs have been stable. Ed has lost 2.7 kg since starting  cardiac rehab Ed is doing well with exercise at intensive cardiac rehab. Ed's vital signs have been stable. Ed has increased his workloads  Ed has maintianed his weight  loss 2.7 kg since starting cardiac rehab. Ed will complete intensive cardiac rehab on 03/24/22 Ed is doing well with exercise at intensive cardiac rehab. Ed's vital signs have been stable. Ed has increased his workloads  Ed has maintianed his weight  loss 3.7 kg since starting cardiac rehab. Ed will complete intensive cardiac rehab at the end of the month    Expected Outcomes Shaunak will continue to participate in intensive cardiac rehab for exercise, nutrition and lifestyle modifications Chong will continue to participate in intensive cardiac rehab for exercise, nutrition and lifestyle modifications Arley will continue to participate in intensive cardiac rehab for exercise, nutrition and lifestyle modifications Zhion will continue to participate in intensive cardiac rehab for exercise, nutrition and lifestyle modifications             Exercise Goals and Review:  Exercise Goals     Row Name 01/05/22 1452             Exercise Goals   Increase Physical Activity Yes       Intervention Provide advice, education, support and counseling about physical activity/exercise needs.;Develop an individualized exercise prescription for aerobic and resistive training based on initial evaluation findings, risk stratification, comorbidities and participant's personal goals.       Expected Outcomes Short Term: Attend rehab on a regular basis to increase amount of physical activity.;Long Term: Exercising regularly at least 3-5 days a week.;Long Term: Add in home exercise to make exercise part of routine and to increase amount of physical activity.       Increase Strength and Stamina Yes       Intervention Provide advice, education, support and counseling about physical activity/exercise needs.;Develop an individualized exercise prescription for  aerobic and resistive training based on initial evaluation findings, risk stratification, comorbidities and participant's personal goals.  Expected Outcomes Short Term: Increase workloads from initial exercise prescription for resistance, speed, and METs.;Short Term: Perform resistance training exercises routinely during rehab and add in resistance training at home;Long Term: Improve cardiorespiratory fitness, muscular endurance and strength as measured by increased METs and functional capacity ( )       Able to understand and use rate of perceived exertion (RPE) scale Yes       Intervention Provide education and explanation on how to use RPE scale       Expected Outcomes Short Term: Able to use RPE daily in rehab to express subjective intensity level;Long Term:  Able to use RPE to guide intensity level when exercising independently       Knowledge and understanding of Target Heart Rate Range (THRR) Yes       Intervention Provide education and explanation of THRR including how the numbers were predicted and where they are located for reference       Expected Outcomes Short Term: Able to state/look up THRR;Short Term: Able to use daily as guideline for intensity in rehab;Long Term: Able to use THRR to govern intensity when exercising independently       Understanding of Exercise Prescription Yes       Intervention Provide education, explanation, and written materials on patient's individual exercise prescription       Expected Outcomes Short Term: Able to explain program exercise prescription;Long Term: Able to explain home exercise prescription to exercise independently                Exercise Goals Re-Evaluation:  Exercise Goals Re-Evaluation     Row Name 01/27/22 1617 02/17/22 1710 03/27/22 1638         Exercise Goal Re-Evaluation   Exercise Goals Review Increase Physical Activity;Understanding of Exercise Prescription;Increase Strength and Stamina;Knowledge and understanding of  Target Heart Rate Range (THRR);Able to understand and use rate of perceived exertion (RPE) scale Increase Physical Activity;Understanding of Exercise Prescription;Increase Strength and Stamina;Knowledge and understanding of Target Heart Rate Range (THRR);Able to understand and use rate of perceived exertion (RPE) scale Increase Physical Activity;Understanding of Exercise Prescription;Increase Strength and Stamina;Knowledge and understanding of Target Heart Rate Range (THRR);Able to understand and use rate of perceived exertion (RPE) scale     Comments Pt first day in the [facility] program. Pt tolerated exercise well with anaverage MET level of 3.55. Pt is learning his THRR, RPE and ExRx. Reviewed MET's, goals and home ExRx. Pt tolerated exercise well with anaverage MET level of 4.3. Pt is showing good progression and feels good with his progress so far by increasing his strength, stamina and adjustments to home ExRx. Pt is continueing to work with RD for diet modifications but feels good with changes he has made so far. Pt will continue to exercise on his own by walking and using his home elliptical 2 days for 30-45 mins per session. Reviewed MET's and goals. Pt tolerated exercise well with anaverage MET level of 4.9. Pt is progressing well and feels good about an increase in strength and stamina. Pt feels good about his knowlege of home Ex and diet control, hes just working on making more of a routine in his life.     Expected Outcomes Will continue to monitor pt and progress workloads as tolerated without sign or symptom. Pt will continue to exercise on his own and gain strength. Will continue to monitor pt and progress workloads as tolerated without sign or symptom. Will continue to monitor pt and progress workloads as  tolerated without sign or symptom.              Nutrition & Weight - Outcomes:  Pre Biometrics - 01/05/22 1439       Pre Biometrics   Waist Circumference 43 inches    Hip Circumference  43.5 inches    Waist to Hip Ratio 0.99 %    Triceps Skinfold 13 mm    % Body Fat 27.7 %    Grip Strength 42 kg    Flexibility 15.75 in    Single Leg Stand 20.93 seconds             Post Biometrics - 04/10/22 1649        Post  Biometrics   Height 6' 3.5" (1.918 m)    Weight 98 kg    Waist Circumference 42 inches    Hip Circumference 41 inches    Waist to Hip Ratio 1.02 %    BMI (Calculated) 26.64    Triceps Skinfold 12 mm    % Body Fat 26.5 %    Grip Strength 35 kg    Flexibility 15 in    Single Leg Stand 16.54 seconds             Nutrition:  Nutrition Therapy & Goals - 03/27/22 1512       Nutrition Therapy   Diet Heart Healthy Diet    Drug/Food Interactions Statins/Certain Fruits      Personal Nutrition Goals   Nutrition Goal Patient to identify strategies for reducing cardiovascular risk by attending the [facility] education and nutrition series weekly    Personal Goal #2 Patient to improve diet quality by using the plate method as a guide for meal planning to include lean protein/plant protein, fruits, vegetables, whole grains, and nonfat dairy as part of a balanced diet    Personal Goal #3 Patient to reduce sodium to 1500mg  per day    Comments Goals in progress. He continues to attend the [facility] education and nutrition series regularly.  Ed reports increased exercise/physical activity, decreased meal skipping, and reduced portion sizes. He reports reduced sodium intake and reading food labels for sodium. He will continue to benefit from attending Intensive Cardiac Rehab for nutrition, exercise and lifestyle modification.      Intervention Plan   Intervention Prescribe, educate and counsel regarding individualized specific dietary modifications aiming towards targeted core components such as weight, hypertension, lipid management, diabetes, heart failure and other comorbidities.;Nutrition handout(s) given to patient.    Expected Outcomes Short Term Goal:  Understand basic principles of dietary content, such as calories, fat, sodium, cholesterol and nutrients.;Long Term Goal: Adherence to prescribed nutrition plan.             Nutrition Discharge:  Nutrition Assessments - 01/05/22 1320       Rate Your Plate Scores   Pre Score 54             Education Questionnaire Score:  Knowledge Questionnaire Score - 04/12/22 1644       Knowledge Questionnaire Score   Post Score 22/24             Goals reviewed with patient; copy given to patient.Pt graduates from  Intensive/Traditional cardiac rehab program on 04/14/22  with completion of  43 exercise and education sessions. Pt maintained good attendance and progressed nicely during their participation in rehab as evidenced by increased MET level.  Ed increased his distance on his post exercise walk test by 695 feet! Medication list reconciled. Repeat  PHQ score- 0 .  Pt has made significant lifestyle changes and should be commended for their success. Ed achieved their goals during cardiac rehab.   Pt plans to continue exercise at home by walking and riding his bike. Ed feels stronger, we are proud of his progress!Thayer Headings RN BSN

## 2022-04-18 IMAGING — CT CT ABD-PELV W/ CM
2 of 5 series · 17 of 46 positions shown, 19 images · IV contrast (omnipaque)
Comparison: None.

CLINICAL DATA: Left lower quadrant pain and nausea for 4 days.

EXAM:
CT ABDOMEN AND PELVIS WITH CONTRAST
TECHNIQUE: Multidetector CT imaging of the abdomen and pelvis was performed
using the standard protocol following bolus administration of
intravenous contrast.
CONTRAST:  100mL OMNIPAQUE IOHEXOL 300 MG/ML  SOLN

[Series 3: a/p w/ 5mm · axial · 0.80mm/px · z∈[+890,+1366]mm · 14 of 107 slices shown, 16 images]
[im 6/107  soft-tissue]
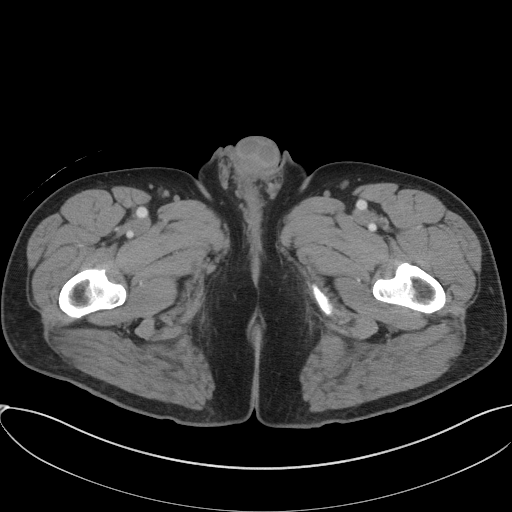
[im 6/107  bone]
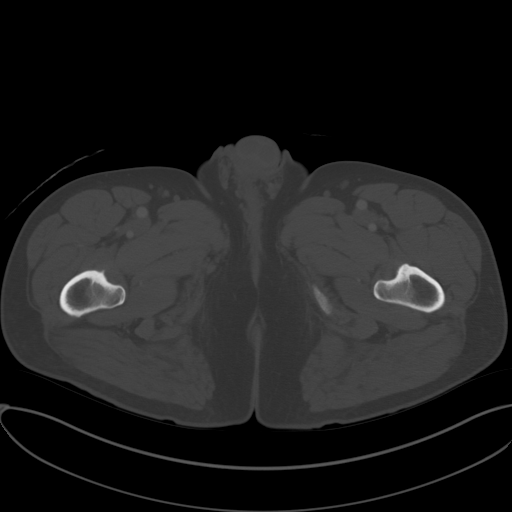
[im 16/107  soft-tissue]
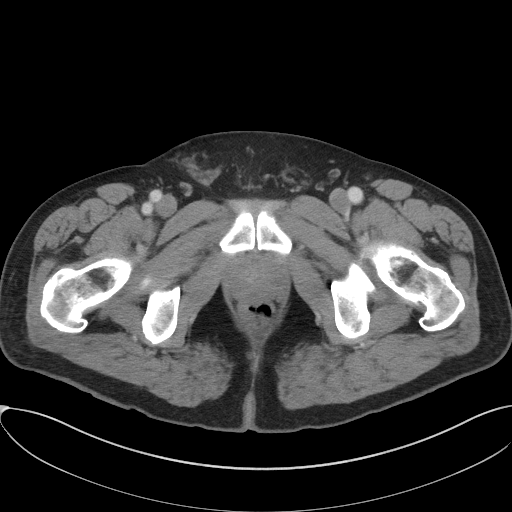
[im 22/107  soft-tissue]
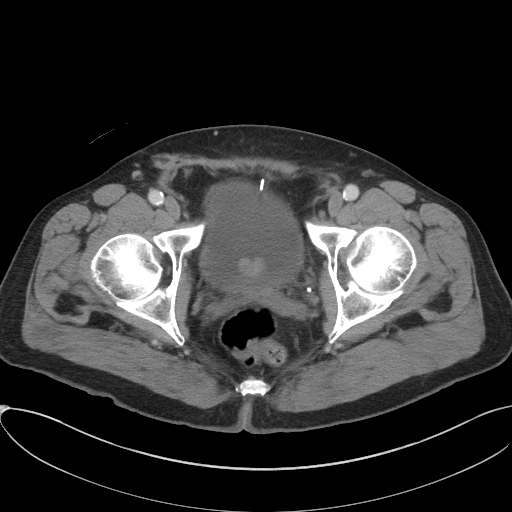
[im 27/107  soft-tissue]
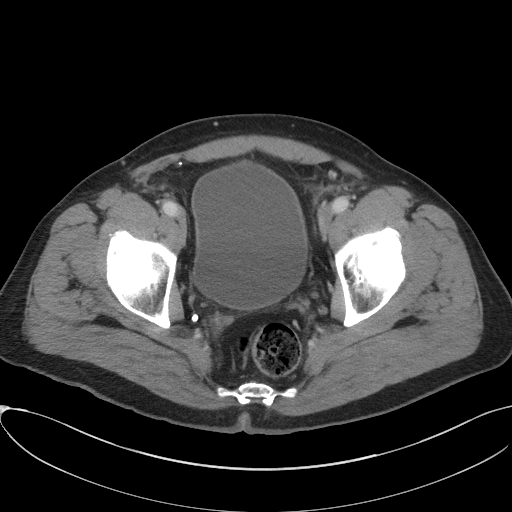
[im 38/107  soft-tissue]
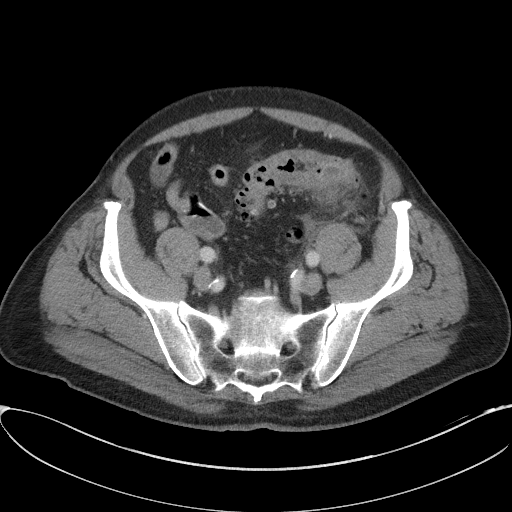
[im 43/107  soft-tissue]
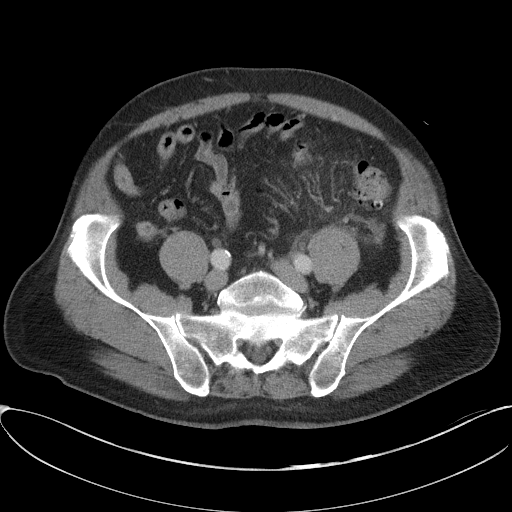
[im 48/107  soft-tissue]
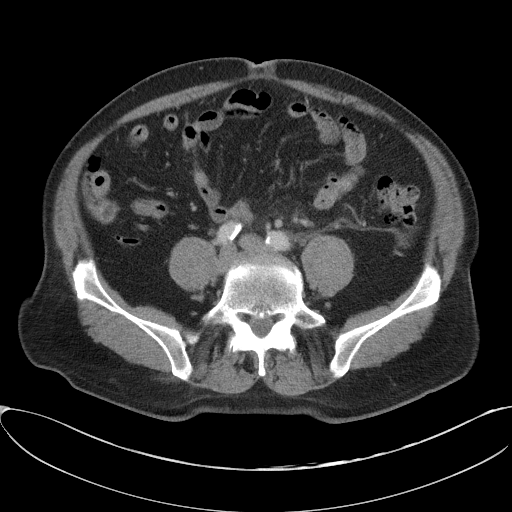
[im 59/107  soft-tissue]
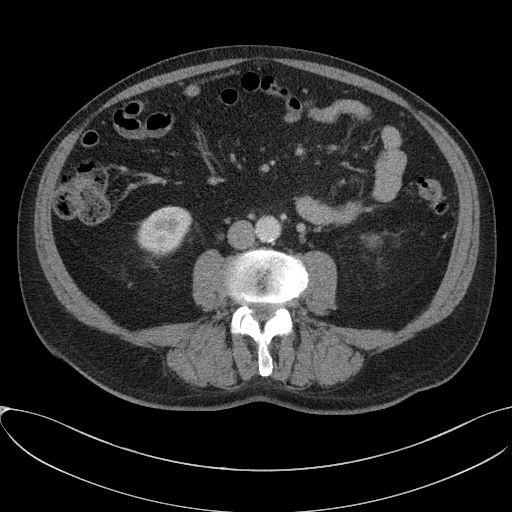
[im 64/107  soft-tissue]
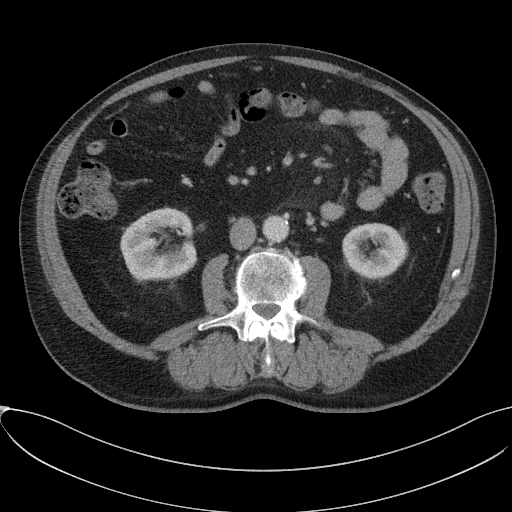
[im 64/107  bone]
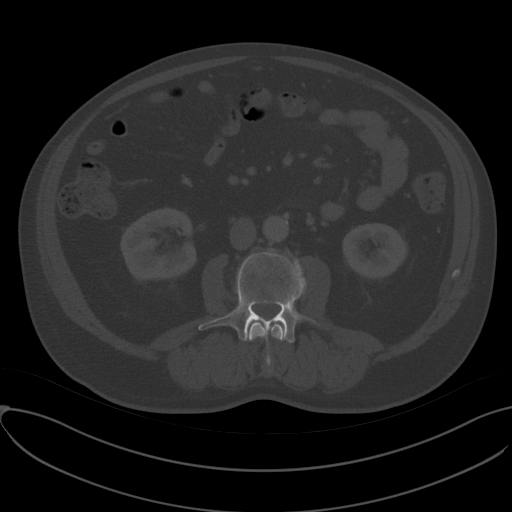
[im 69/107  soft-tissue]
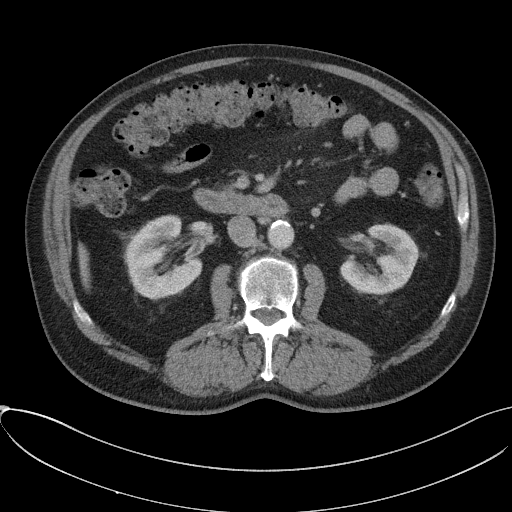
[im 80/107  soft-tissue]
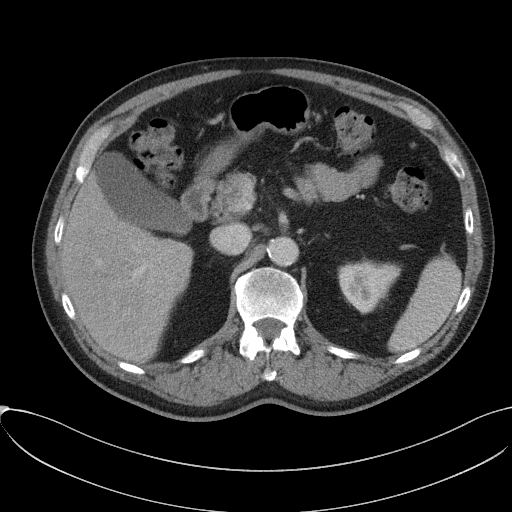
[im 85/107  soft-tissue]
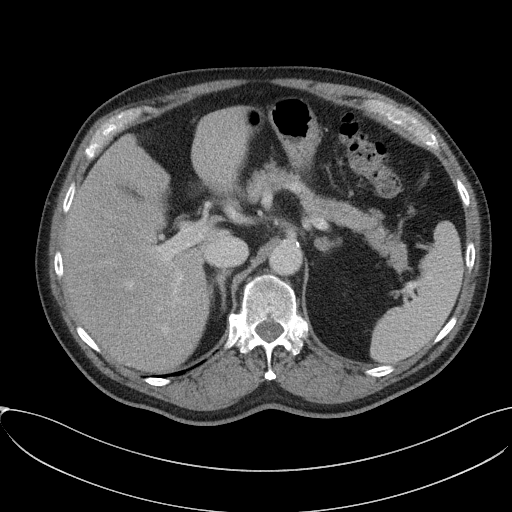
[im 91/107  soft-tissue]
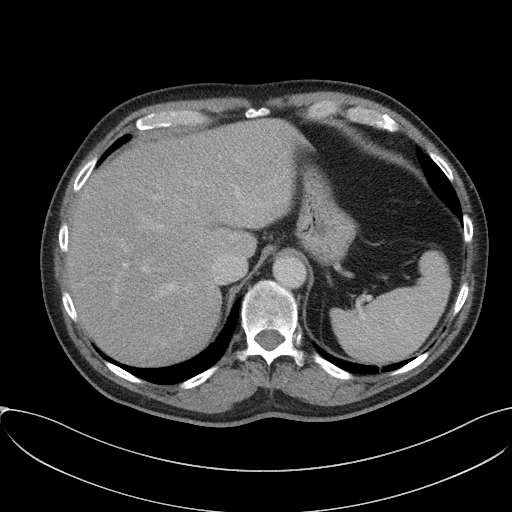
[im 101/107  soft-tissue]
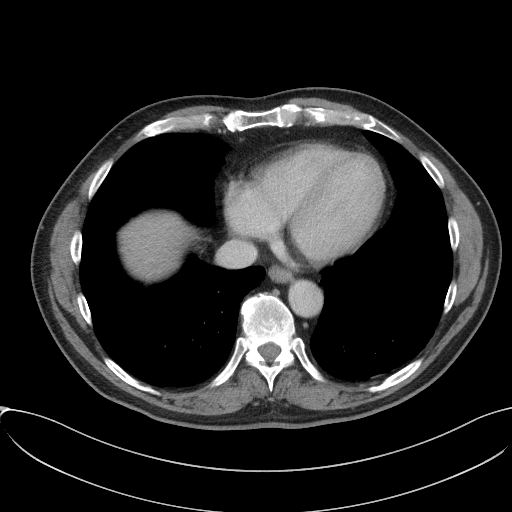

[Series 6: a/p w/ cor · coronal · 0.92mm/px · 3 of 174 slices shown]
[im 58/174  soft-tissue]
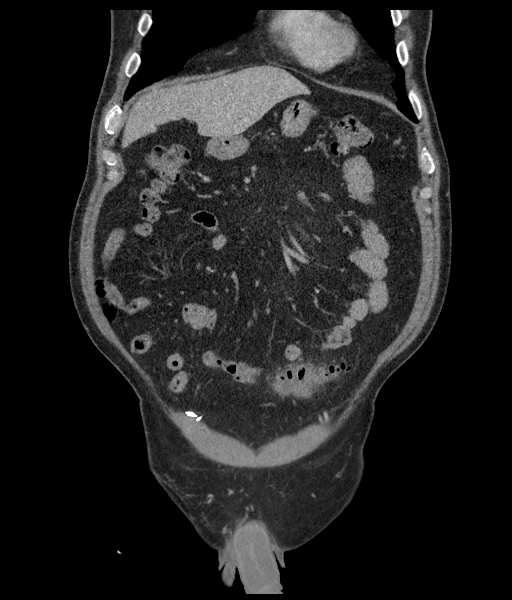
[im 77/174  soft-tissue]
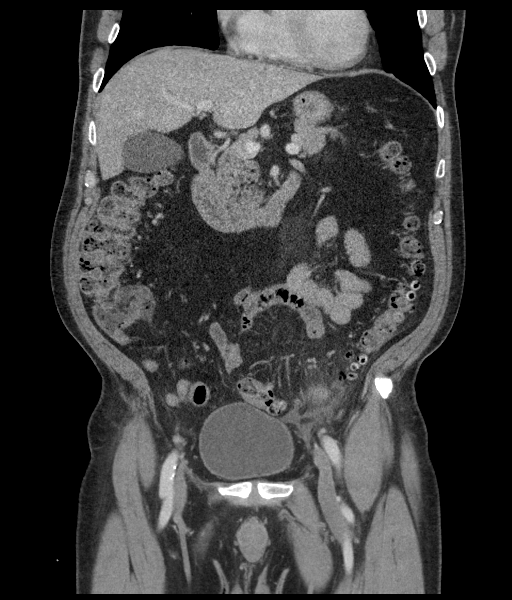
[im 97/174  soft-tissue]
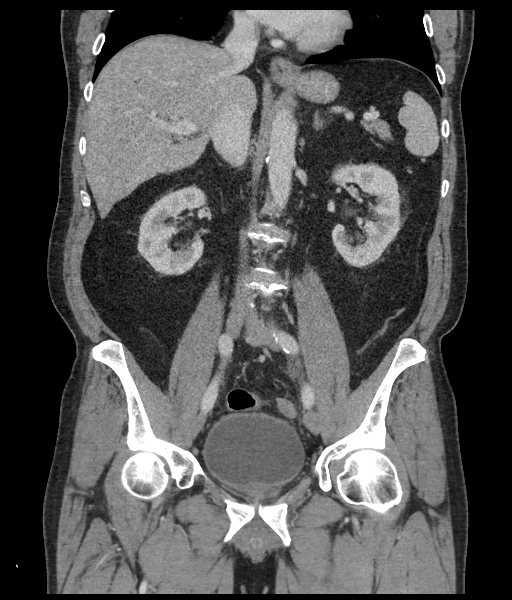

[17 of 46 positions shown; findings below may reference images not displayed]

FINDINGS: Lower Chest: No acute findings.

Hepatobiliary: No hepatic masses identified.

Pancreas:  No mass or inflammatory changes.

Spleen: Within normal limits in size and appearance.

Adrenals/Urinary Tract: No masses identified. Tiny cyst noted in
upper pole of left kidney. No evidence of ureteral calculi or
hydronephrosis.

Stomach/Bowel: Moderate diverticulitis is seen involving proximal
sigmoid colon. No evidence of extraluminal air or abscess. No
evidence of bowel obstruction.

Vascular/Lymphatic: No pathologically enlarged lymph nodes. No
abdominal aortic aneurysm. Aortic atherosclerosis noted.

Reproductive:  No mass or other significant abnormality.

Other: Postop changes from previous right inguinal hernia repair
noted. No evidence of recurrent hernia or mass.

Musculoskeletal:  No suspicious bone lesions identified.
IMPRESSION: Moderate sigmoid diverticulitis. No evidence of abscess or other
complication.

Aortic Atherosclerosis ([id number]).

## 2022-04-30 DIAGNOSIS — G4733 Obstructive sleep apnea (adult) (pediatric): Secondary | ICD-10-CM | POA: Diagnosis not present

## 2022-05-30 DIAGNOSIS — G4733 Obstructive sleep apnea (adult) (pediatric): Secondary | ICD-10-CM | POA: Diagnosis not present

## 2022-06-02 ENCOUNTER — Encounter: Payer: Self-pay | Admitting: Internal Medicine

## 2022-06-02 ENCOUNTER — Ambulatory Visit: Payer: BC Managed Care – PPO | Attending: Internal Medicine | Admitting: Internal Medicine

## 2022-06-02 VITALS — BP 112/58 | HR 49 | Ht 77.0 in | Wt 218.0 lb

## 2022-06-02 DIAGNOSIS — I10 Essential (primary) hypertension: Secondary | ICD-10-CM

## 2022-06-02 DIAGNOSIS — I5020 Unspecified systolic (congestive) heart failure: Secondary | ICD-10-CM | POA: Diagnosis not present

## 2022-06-02 DIAGNOSIS — D6869 Other thrombophilia: Secondary | ICD-10-CM

## 2022-06-02 DIAGNOSIS — I25110 Atherosclerotic heart disease of native coronary artery with unstable angina pectoris: Secondary | ICD-10-CM | POA: Diagnosis not present

## 2022-06-02 DIAGNOSIS — Z955 Presence of coronary angioplasty implant and graft: Secondary | ICD-10-CM

## 2022-06-02 DIAGNOSIS — I480 Paroxysmal atrial fibrillation: Secondary | ICD-10-CM

## 2022-06-02 DIAGNOSIS — E782 Mixed hyperlipidemia: Secondary | ICD-10-CM

## 2022-06-02 MED ORDER — FUROSEMIDE 20 MG PO TABS: 20.0000 mg | ORAL_TABLET | Freq: Every day | ORAL | 6 refills | Status: DC | PRN

## 2022-06-02 NOTE — Patient Instructions (Signed)
Medication Instructions:  Your physician has recommended you make the following change in your medication:   1) START furosemide (Lasix) 20mg  daily as needed for shortness of breath or weight gain of 3 lbs overnight or 5 lbs in one week.  2) STOP metoprolol succinate (Toprol XL)  *If you need a refill on your cardiac medications before your next appointment, please call your pharmacy*  Lab Work: None ordered today.  Testing/Procedures: Your physician has requested that you have an echocardiogram early October 2024. Echocardiography is a painless test that uses sound waves to create images of your heart. It provides your doctor with information about the size and shape of your heart and how well your heart's chambers and valves are working. This procedure takes approximately one hour. There are no restrictions for this procedure. Please do NOT wear cologne, perfume, aftershave, or lotions (deodorant is allowed). Please arrive 15 minutes prior to your appointment time.   Follow-Up: At [facility], you and your health needs are our priority.  As part of our continuing mission to provide you with exceptional heart care, we have created designated Provider Care Teams.  These Care Teams include your primary Cardiologist (physician) and Advanced Practice Providers (APPs -  Physician Assistants and Nurse Practitioners) who all work together to provide you with the care you need, when you need it.  Your next appointment:   5 month(s) in October (1-2 weeks after echocardiogram)  The format for your next appointment:   In Person  Provider:   Christell Constant, MD {

## 2022-06-02 NOTE — Progress Notes (Signed)
Cardiology Office Note:    Date:  06/02/2022   ID:  Ceasar Lund, DOB Jul 18, 1955, MRN [id number]  PCP:  Barbie Banner, MD   [facility] Providers Cardiologist:  Christell Constant, MD     Referring MD: Barbie Banner, MD   CC: Transition to new cardiologist  History of Present Illness:    Alex Arias is a 67 y.o. male with a hx of CAD with mid-distal LAD PCI, HLD, HTN, HFrEF 40-45%, PAF on eliquis.  Former Dr. Katrinka Blazing patient, transitioning to new cardiologist 2024. 2024: His anginal equivalent with sudden onset nausea and vomiting.  With diarrhea.  Felt like he was punched in the back.  Started ARNI; increase ARNI  Patient notes that he is doing fine with cardiac rehab- had a good time..   Since last visit notes that he has been eating a lot lower sodium because of this. There are no interval hospital/ED visit.    No chest pain or pressure .  No SOB/DOE and no PND/Orthopnea.  No weight gain or leg swelling.  No palpitations or syncope .  Past Medical History:  Diagnosis Date   Abdominal distension    Abdominal pain    Arrhythmia    Coronary artery disease involving native coronary artery of native heart with unstable angina pectoris (HCC) 10/30/2021   Cath-PCI 10/30/2021: 99% thrombotic SubTO of mLAD just after D2 -> DES PCI 3.0 x 16 Synergy XD => deployed to 3.0 mm.  2 big Diags, LCx-OM1 & Large Dominant RCA - rPDA & PAV-RPL1-3 free of disease. EF ~40-45%, LVEDP 21--25 mmHg mid-apical anterior & apical HK.   Cough    Family history of diabetes mellitus    Itching    NSTEMI (non-ST elevated myocardial infarction) (HCC) - + Troponin with Anterior & Inferior ST depressions => ongoing CP => Urgent Cath 10/30/2021   Cath with 99% subTO of LAD => DES PCI   Pleurisy    Presence of drug coated stent in LAD coronary artery 10/30/2021   mLAD 99% thrombotic lesion: DES PCI w/ Synergy XD 3 x 16 => deployed ~3-3.1 mm   Right inguinal hernia    Unspecified viral infection, in  conditions classified elsewhere and of unspecified site    viral syndrome - per notes from [facility] dated 01/03/11    Past Surgical History:  Procedure Laterality Date   BOWEL RESECTION N/A 04/02/2014   Procedure: SMALL BOWEL RESECTION;  Surgeon: Violeta Gelinas, MD;  Location: [facility] OR;  Service: General;  Laterality: N/A;   CARDIAC CATHETERIZATION     CORONARY/GRAFT ACUTE MI REVASCULARIZATION N/A 10/30/2021   Procedure: Coronary/Graft Acute MI Revascularization;  Surgeon: Lyn Records, MD;  Location: [facility] INVASIVE CV LAB;  Service: Cardiovascular;  Laterality: N/A;   INGUINAL HERNIA REPAIR  02/01/2011   Procedure: LAPAROSCOPIC INGUINAL HERNIA;  Surgeon: Almond Lint, MD;  Location: [facility] OR;  Service: General;  Laterality: Bilateral;  Laparoscopic inguinal repair with mesh - Bilateral   INGUINAL HERNIA REPAIR Right 04/02/2014   Procedure: HERNIA REPAIR INGUINAL INCARCERATED ;  Surgeon: Violeta Gelinas, MD;  Location: [facility] OR;  Service: General;  Laterality: Right;   LEFT HEART CATH AND CORONARY ANGIOGRAPHY N/A 10/30/2021   Procedure: LEFT HEART CATH AND CORONARY ANGIOGRAPHY;  Surgeon: Lyn Records, MD;  Location: [facility] INVASIVE CV LAB;  Service: Cardiovascular;  Laterality: N/A;   WISDOM TOOTH EXTRACTION      Current Medications: Current Meds  Medication Sig   apixaban (ELIQUIS) 5 MG TABS  tablet TAKE 1 TABLET BY MOUTH TWICE A DAY   atorvastatin (LIPITOR) 40 MG tablet Take 1 tablet (40 mg total) by mouth daily.   Cholecalciferol (VITAMIN D3 PO) Take 1 capsule by mouth every evening.   clopidogrel (PLAVIX) 75 MG tablet Take 1 tablet (75 mg total) by mouth daily.   empagliflozin (JARDIANCE) 10 MG TABS tablet TAKE 1 TABLET BY MOUTH EVERY DAY   furosemide (LASIX) 20 MG tablet Take 1 tablet (20 mg total) by mouth daily as needed for edema (for shortness of breath or weight gain of 3 lbs overnight or 5 lbs in one week.).   levothyroxine (SYNTHROID) 88 MCG tablet Take 88 mcg by mouth daily before  breakfast.   Multiple Vitamin (MULITIVITAMIN WITH MINERALS) TABS Take 1 tablet by mouth every evening.   nitroGLYCERIN (NITROSTAT) 0.4 MG SL tablet Place 1 tablet (0.4 mg total) under the tongue every 5 (five) minutes as needed for chest pain.   pantoprazole (PROTONIX) 40 MG tablet Take 40 mg by mouth in the morning.   sacubitril-valsartan (ENTRESTO) 24-26 MG Take 1 tablet by mouth 2 (two) times daily.   spironolactone (ALDACTONE) 25 MG tablet Take 1 tablet (25 mg total) by mouth daily.   Study - EVOLVE-MI - evolocumab (REPATHA) 140 mg/mL SQ injection (PI-Stuckey) Inject 140 mg into the skin every 14 (fourteen) days. For Investigational Use Only. Inject subcutaneously into abdomen, thigh, or upper arm every 14 days. Rotate injection sites and do not inject into areas where skin is tender, bruised, or red. Please contact [facility] for any questions or concerns regarding this medication.   tadalafil (CIALIS) 5 MG tablet Take 5 mg by mouth every evening.   [DISCONTINUED] metoprolol succinate (TOPROL-XL) 25 MG 24 hr tablet Take 12.5 mg by mouth daily.     Allergies:   Ace inhibitors   Social History   Socioeconomic History   Marital status: Married    Spouse name: Verlon Au   Number of children: 2   Years of education: Not on file   Highest education level: Master's degree (e.g., MA, MS, MEng, MEd, MSW, MBA)  Occupational History   Occupation: [employer]  Tobacco Use   Smoking status: Never   Smokeless tobacco: Never  Vaping Use   Vaping Use: Never used  Substance and Sexual Activity   Alcohol use: Yes    Comment: 1 - 3 wine or beer weekly   Drug use: No   Sexual activity: Not on file  Other Topics Concern   Not on file  Social History Narrative   Not on file   Social Determinants of Health   Financial Resource Strain: Low Risk  (11/02/2021)   Overall Financial Resource Strain (CARDIA)    Difficulty of Paying Living Expenses: Not very hard  Food Insecurity:  Not on file  Transportation Needs: No Transportation Needs (11/02/2021)   PRAPARE - Administrator, Civil Service (Medical): No    Lack of Transportation (Non-Medical): No  Physical Activity: Not on file  Stress: Not on file  Social Connections: Not on file     Family History: Father, and mother had stroke. History of coronary artery disease notable for grandfather  ROS:   Please see the history of present illness.     All other systems reviewed and are negative.  EKGs/Labs/Other Studies Reviewed:    The following studies were reviewed today:   Cardiac Studies & Procedures   CARDIAC CATHETERIZATION  CARDIAC CATHETERIZATION 10/30/2021  Narrative  CONCLUSIONS: 99% stenosis of the mid LAD after the large second diagonal.  TIMI grade I flow was noted in the vessel.  The vessel was treated with angioplasty and stenting using a 3.0 x 16 Synergy reducing the stenosis to 0% with improvement in TIMI flow to 2.5.  Sluggish flow was related to distal embolization and was treated with IV Aggrastat, IC verapamil, and nitroglycerin intracoronary. Right dominant anatomy without significant obstruction. Left main is widely patent. Circumflex is small and widely patent. Mid to apical anterior wall severe hypokinesis.  EF 40 to 50%.  LVEDP greater than 22 mmHg.  RECOMMENDATIONS:  Preventive therapy with high intensity statin, beta-blocker, and ACE inhibitor therapy if tolerated. Dual antiplatelet therapy with aspirin and Brilinta.  Should continue high intensity antiplatelet treatment for at least 6 months and then perhaps Brilinta monotherapy or converting to clopidogrel monotherapy could be considered. Patient will need a 2D Doppler echocardiogram.  Findings Coronary Findings Diagnostic  Dominance: Right  Left Anterior Descending Mid LAD to Dist LAD lesion is 99% stenosed.  Intervention  Mid LAD to Dist LAD lesion Stent A drug-eluting stent was successfully  placed. Post-Intervention Lesion Assessment The intervention was successful. Pre-interventional TIMI flow is 1. Post-intervention TIMI flow is 3. No complications occurred at this lesion. There is a 0% residual stenosis post intervention.     ECHOCARDIOGRAM  ECHOCARDIOGRAM COMPLETE 12/28/2021  Narrative ECHOCARDIOGRAM REPORT    Patient Name:   Alex Arias   Date of Exam: 12/28/2021 Medical Rec #:  [id number]     Height:       77.0 in Accession #:    [id number]    Weight:       221.6 lb Date of Birth:  February 03, 1955     BSA:          2.336 m Patient Age:    66 years      BP:           120/72 mmHg Patient Gender: M             HR:           58 bpm. Exam Location:  [address]  Procedure: 2D Echo, Cardiac Doppler, Color Doppler and Intracardiac Opacification Agent  Indications:    I25.10 Coronary artery disease.  History:        Patient has prior history of Echocardiogram examinations, most recent 11/01/2021. CAD; STENT. NSTEMI.  Sonographer:    Sedonia Small Rodgers-Jones RDCS Referring Phys: [id number] TESSA N CONTE  IMPRESSIONS   1. Left ventricular ejection fraction, by estimation, is 40 to 45%. The left ventricle has mildly decreased function. The left ventricle demonstrates regional wall motion abnormalities (see scoring diagram/findings for description). Left ventricular diastolic parameters are indeterminate. There is severe hypokinesis of the left ventricular, basal-mid inferoseptal wall. There is severe hypokinesis of the left ventricular, apical septal wall. There is akinesis of the left ventricular, apical segment and anterior wall. There is akinesis of the left ventricular, mid anteroseptal wall. 2. Right ventricular systolic function is normal. The right ventricular size is normal. Tricuspid regurgitation signal is inadequate for assessing PA pressure. 3. The mitral valve is normal in structure. No evidence of mitral valve regurgitation. No evidence of mitral stenosis. 4. The  aortic valve is normal in structure. Aortic valve regurgitation is trivial. No aortic stenosis is present. 5. Aortic dilatation noted. There is mild dilatation of the ascending aorta, measuring 38 mm. 6. The inferior vena cava is normal in size with greater than 50%  respiratory variability, suggesting right atrial pressure of 3 mmHg.  FINDINGS Left Ventricle: Left ventricular ejection fraction, by estimation, is 40 to 45%. The left ventricle has mildly decreased function. The left ventricle demonstrates regional wall motion abnormalities. Severe hypokinesis of the left ventricular, basal-mid inferoseptal wall. Severe hypokinesis of the left ventricular, apical septal wall. Definity contrast agent was given IV to delineate the left ventricular endocardial borders. The left ventricular internal cavity size was normal in size. There is no left ventricular hypertrophy. Left ventricular diastolic parameters are indeterminate. Normal left ventricular filling pressure.  Right Ventricle: The right ventricular size is normal. No increase in right ventricular wall thickness. Right ventricular systolic function is normal. Tricuspid regurgitation signal is inadequate for assessing PA pressure.  Left Atrium: Left atrial size was normal in size.  Right Atrium: Right atrial size was normal in size.  Pericardium: There is no evidence of pericardial effusion.  Mitral Valve: The mitral valve is normal in structure. There is mild calcification of the anterior mitral valve leaflet(s). No evidence of mitral valve regurgitation. No evidence of mitral valve stenosis.  Tricuspid Valve: The tricuspid valve is normal in structure. Tricuspid valve regurgitation is trivial. No evidence of tricuspid stenosis.  Aortic Valve: The aortic valve is normal in structure. Aortic valve regurgitation is trivial. No aortic stenosis is present.  Pulmonic Valve: The pulmonic valve was normal in structure. Pulmonic valve regurgitation  is mild. No evidence of pulmonic stenosis.  Aorta: Aortic dilatation noted. There is mild dilatation of the ascending aorta, measuring 38 mm.  Venous: The inferior vena cava is normal in size with greater than 50% respiratory variability, suggesting right atrial pressure of 3 mmHg.  IAS/Shunts: No atrial level shunt detected by color flow Doppler.   LEFT VENTRICLE PLAX 2D LVOT diam:     2.20 cm   Diastology LV SV:         95        LV e' medial:    7.40 cm/s LV SV Index:   41        LV E/e' medial:  9.6 LVOT Area:     3.80 cm  LV e' lateral:   10.20 cm/s LV E/e' lateral: 7.0   RIGHT VENTRICLE          IVC RV Basal diam:  3.60 cm  IVC diam: 1.50 cm RV Mid diam:    3.20 cm  LEFT ATRIUM             Index        RIGHT ATRIUM           Index LA Vol (A2C):   84.9 ml 36.32 ml/m  RA Area:     14.90 cm LA Vol (A4C):   34.8 ml 14.89 ml/m  RA Volume:   40.30 ml  17.25 ml/m LA Biplane Vol: 54.9 ml 23.50 ml/m AORTIC VALVE LVOT Vmax:   109.50 cm/s LVOT Vmean:  68.050 cm/s LVOT VTI:    0.250 m  AORTA Ao Asc diam: 3.80 cm  MITRAL VALVE MV Area (PHT): 3.68 cm    SHUNTS MV Decel Time: 206 msec    Systemic VTI:  0.25 m MV E velocity: 71.35 cm/s  Systemic Diam: 2.20 cm MV A velocity: 62.30 cm/s MV E/A ratio:  1.15  Armanda Magic MD Electronically signed by Armanda Magic MD Signature Date/Time: 12/28/2021/4:10:24 PM    Final    MONITORS  LONG TERM MONITOR (3-14 DAYS) 04/01/2022  Narrative   Patient had  a minimum heart rate of 38 bpm, maximum heart rate of 240 bpm, and average heart rate of 63 bpm.   Predominant underlying rhythm was sinus rhythm.   Rare 4-5 beat runs of NSVT   Small runs of SVT, longest at 11 seconds.   Isolated PACs were rare (<1.0%).   Isolated PVCs were occasional (1.0%).   No triggered and diary events.  Asymptomatic occasional PVCs and NSVT.            Recent Labs: 10/30/2021: ALT 41 11/02/2021: Magnesium 2.0 01/27/2022: BUN 14; Creatinine,  Ser 1.22; Hemoglobin 17.1; NT-Pro BNP 217; Platelets 203; Potassium 4.8; Sodium 145  Recent Lipid Panel    Component Value Date/Time   CHOL 87 (L) 01/27/2022 1622   TRIG 83 01/27/2022 1622   HDL 53 01/27/2022 1622   CHOLHDL 1.6 01/27/2022 1622   CHOLHDL 2.8 10/31/2021 0232   VLDL 21 10/31/2021 0232   LDLCALC 17 01/27/2022 1622     Risk Assessment/Calculations:    CHA2DS2-VASc Score = 4   This indicates a 4.8% annual risk of stroke. The patient's score is based upon: CHF History: 1 HTN History: 1 Diabetes History: 0 Stroke History: 0 Vascular Disease History: 1 Age Score: 1 Gender Score: 0             Physical Exam:    VS:  BP (!) 112/58   Pulse (!) 49   Ht 6\' 5"  (1.956 m)   Wt 218 lb (98.9 kg)   SpO2 98%   BMI 25.85 kg/m     Wt Readings from Last 3 Encounters:  06/02/22 218 lb (98.9 kg)  04/10/22 216 lb 0.8 oz (98 kg)  01/27/22 220 lb (99.8 kg)    GEN:  Well nourished, well developed in no acute distress HEENT: Normal NECK: No JVD CARDIAC: RRR, no murmurs, rubs, gallops RESPIRATORY:  Clear to auscultation without rales, wheezing or rhonchi  ABDOMEN: Soft, non-tender, non-distended MUSCULOSKELETAL:  Trace right leg edema; No deformity  SKIN: Warm and dry NEUROLOGIC:  Alert and oriented x 3 PSYCHIATRIC:  Normal affect   ASSESSMENT:    1. Essential hypertension   2. Coronary artery disease involving native coronary artery of native heart with unstable angina pectoris (HCC)   3. PAF (paroxysmal atrial fibrillation) (HCC)   4. HFrEF (heart failure with reduced ejection fraction) (HCC)   5. Acquired thrombophilia (HCC)   6. Mixed hyperlipidemia   7. Presence of drug coated stent in LAD coronary artery     PLAN:     Heart Failure Reduced Ejection Fraction  - chronic - NYHA class I, Stage C, euvolemic, etiology from ischemia - Diuretic regimen: starting lasix 20 mg  as a PRN - Continue low dose ARNI - continue MRA - Continue SGLT2i - notes a bit of  fatigue with heart rates of 49; we will stop the metoprolol succinate  - s/p cardiac rehab - continue low salt diet  Coronary Artery Disease; Obstructive - asymptomatic on therapy - anatomy: mid-distal LAD - continue plavix and DOAC until 10/24 unless no AF - continue statin, goal LDL < 55, on PCSK9i as part of EVOLVE trial LDL 17 - no nitro glycerin use  Peri-STEMI Atrial Fibrillation Asymptomatic SVT  - CHADSVASC=4. - Continue anticoagulation with eliquis; Acquired Thrombophilia  OSA - discussed his current sleep medicine otpions  Echo in October to eval potential recovery Will see in October 2024           Medication Adjustments/Labs and Tests Ordered:  Current medicines are reviewed at length with the patient today.  Concerns regarding medicines are outlined above.  Orders Placed This Encounter  Procedures   ECHOCARDIOGRAM COMPLETE   Meds ordered this encounter  Medications   furosemide (LASIX) 20 MG tablet    Sig: Take 1 tablet (20 mg total) by mouth daily as needed for edema (for shortness of breath or weight gain of 3 lbs overnight or 5 lbs in one week.).    Dispense:  30 tablet    Refill:  6    Patient Instructions  Medication Instructions:  Your physician has recommended you make the following change in your medication:   1) START furosemide (Lasix) 20mg  daily as needed for shortness of breath or weight gain of 3 lbs overnight or 5 lbs in one week.  2) STOP metoprolol succinate (Toprol XL)  *If you need a refill on your cardiac medications before your next appointment, please call your pharmacy*  Lab Work: None ordered today.  Testing/Procedures: Your physician has requested that you have an echocardiogram early October 2024. Echocardiography is a painless test that uses sound waves to create images of your heart. It provides your doctor with information about the size and shape of your heart and how well your heart's chambers and valves are working.  This procedure takes approximately one hour. There are no restrictions for this procedure. Please do NOT wear cologne, perfume, aftershave, or lotions (deodorant is allowed). Please arrive 15 minutes prior to your appointment time.   Follow-Up: At [facility], you and your health needs are our priority.  As part of our continuing mission to provide you with exceptional heart care, we have created designated Provider Care Teams.  These Care Teams include your primary Cardiologist (physician) and Advanced Practice Providers (APPs -  Physician Assistants and Nurse Practitioners) who all work together to provide you with the care you need, when you need it.  Your next appointment:   5 month(s) in October (1-2 weeks after echocardiogram)  The format for your next appointment:   In Person  Provider:   Christell Constant, MD {   Signed, Christell Constant, MD  06/02/2022 12:45 PM    [facility]

## 2022-06-07 ENCOUNTER — Other Ambulatory Visit: Payer: Self-pay | Admitting: Internal Medicine

## 2022-06-07 DIAGNOSIS — I5022 Chronic systolic (congestive) heart failure: Secondary | ICD-10-CM | POA: Diagnosis not present

## 2022-06-07 DIAGNOSIS — Z79899 Other long term (current) drug therapy: Secondary | ICD-10-CM | POA: Diagnosis not present

## 2022-06-07 DIAGNOSIS — Z7901 Long term (current) use of anticoagulants: Secondary | ICD-10-CM | POA: Diagnosis not present

## 2022-06-07 DIAGNOSIS — I255 Ischemic cardiomyopathy: Secondary | ICD-10-CM | POA: Diagnosis not present

## 2022-06-07 DIAGNOSIS — I2510 Atherosclerotic heart disease of native coronary artery without angina pectoris: Secondary | ICD-10-CM | POA: Diagnosis not present

## 2022-06-07 DIAGNOSIS — I25119 Atherosclerotic heart disease of native coronary artery with unspecified angina pectoris: Secondary | ICD-10-CM | POA: Diagnosis not present

## 2022-06-07 DIAGNOSIS — Z955 Presence of coronary angioplasty implant and graft: Secondary | ICD-10-CM | POA: Diagnosis not present

## 2022-06-07 DIAGNOSIS — E785 Hyperlipidemia, unspecified: Secondary | ICD-10-CM | POA: Diagnosis not present

## 2022-06-07 DIAGNOSIS — I480 Paroxysmal atrial fibrillation: Secondary | ICD-10-CM | POA: Diagnosis not present

## 2022-06-07 DIAGNOSIS — I110 Hypertensive heart disease with heart failure: Secondary | ICD-10-CM | POA: Diagnosis not present

## 2022-06-21 DIAGNOSIS — J019 Acute sinusitis, unspecified: Secondary | ICD-10-CM | POA: Diagnosis not present

## 2022-06-21 DIAGNOSIS — R051 Acute cough: Secondary | ICD-10-CM | POA: Diagnosis not present

## 2022-06-22 DIAGNOSIS — R1032 Left lower quadrant pain: Secondary | ICD-10-CM | POA: Diagnosis not present

## 2022-06-29 ENCOUNTER — Encounter: Payer: Self-pay | Admitting: Internal Medicine

## 2022-06-30 ENCOUNTER — Other Ambulatory Visit (HOSPITAL_COMMUNITY): Payer: Self-pay

## 2022-06-30 DIAGNOSIS — G4733 Obstructive sleep apnea (adult) (pediatric): Secondary | ICD-10-CM | POA: Diagnosis not present

## 2022-07-06 DIAGNOSIS — N179 Acute kidney failure, unspecified: Secondary | ICD-10-CM | POA: Diagnosis not present

## 2022-07-25 ENCOUNTER — Other Ambulatory Visit: Payer: Self-pay | Admitting: Cardiology

## 2022-07-30 DIAGNOSIS — G4733 Obstructive sleep apnea (adult) (pediatric): Secondary | ICD-10-CM | POA: Diagnosis not present

## 2022-08-20 DIAGNOSIS — R059 Cough, unspecified: Secondary | ICD-10-CM | POA: Diagnosis not present

## 2022-08-20 DIAGNOSIS — U071 COVID-19: Secondary | ICD-10-CM | POA: Diagnosis not present

## 2022-08-30 DIAGNOSIS — G4733 Obstructive sleep apnea (adult) (pediatric): Secondary | ICD-10-CM | POA: Diagnosis not present

## 2022-09-01 ENCOUNTER — Encounter: Payer: Self-pay | Admitting: *Deleted

## 2022-09-01 DIAGNOSIS — Z006 Encounter for examination for normal comparison and control in clinical research program: Secondary | ICD-10-CM

## 2022-09-01 NOTE — Research (Unsigned)
36 Week Follow-Up Visit Completed*   []  Not Necessary, No Potential Adverse Events Or Medication Issues Reported On Completed Subject Questionnaire   [x]  Yes, Contact With Subject/Alternate Contact Completed   []  Yes, No Contact With Subject/Alternate Contact Completed, But Electronic Health Record Was Reviewed   []  No, Unable To Contact Subject/Alternate Contact   Have you reviewed Ongoing medications on the Targeted Concomitant Medication form and updated the form as needed?   [x]  Yes   []  No   Subject Status*   [x]  Continuing In Follow-up   []  At Risk For Lost To Follow-up   []  Withdrawal From All Future Study Activities Including Passive Follow-up By Electronic Health Record Review Or Contact With Healthcare Provider Or Family Member/Friend   []  Death   Vital Status*   [x]  Alive   []  Deceased   []  Unknown   Last Known To Be Alive Source*   [x]  Subject Completed Follow-up Questionnaire/Seen In Person/Via Telephone Contact   []  Family Member or Caretaker   []  Primary Physician Or Medical Records   []  Publicly Available Source   []  Other  Date of last dose taken   22-Aug-2022  Over the last 12 weeks did the subject miss any doses?n/a  Over the last 12 weeks did the subject restart Evolocumab after an interruption?n/a

## 2022-09-08 ENCOUNTER — Other Ambulatory Visit (HOSPITAL_COMMUNITY): Payer: Self-pay | Admitting: Cardiology

## 2022-09-08 ENCOUNTER — Other Ambulatory Visit: Payer: Self-pay

## 2022-09-08 ENCOUNTER — Other Ambulatory Visit (HOSPITAL_COMMUNITY): Payer: Self-pay

## 2022-09-08 MED ORDER — REPATHA SURECLICK 140 MG/ML ~~LOC~~ SOAJ
140.0000 mg | SUBCUTANEOUS | 2 refills | Status: DC
  Filled 2022-09-08 – 2022-09-13 (×2): qty 12, 168d supply, fill #0

## 2022-09-11 ENCOUNTER — Other Ambulatory Visit: Payer: Self-pay

## 2022-09-13 ENCOUNTER — Other Ambulatory Visit: Payer: Self-pay

## 2022-09-13 DIAGNOSIS — R351 Nocturia: Secondary | ICD-10-CM | POA: Diagnosis not present

## 2022-09-13 DIAGNOSIS — N401 Enlarged prostate with lower urinary tract symptoms: Secondary | ICD-10-CM | POA: Diagnosis not present

## 2022-09-13 DIAGNOSIS — N529 Male erectile dysfunction, unspecified: Secondary | ICD-10-CM | POA: Diagnosis not present

## 2022-09-13 DIAGNOSIS — Z125 Encounter for screening for malignant neoplasm of prostate: Secondary | ICD-10-CM | POA: Diagnosis not present

## 2022-09-25 ENCOUNTER — Other Ambulatory Visit: Payer: Self-pay

## 2022-09-26 DIAGNOSIS — I5020 Unspecified systolic (congestive) heart failure: Secondary | ICD-10-CM | POA: Diagnosis not present

## 2022-09-26 DIAGNOSIS — I110 Hypertensive heart disease with heart failure: Secondary | ICD-10-CM | POA: Diagnosis not present

## 2022-09-26 DIAGNOSIS — N182 Chronic kidney disease, stage 2 (mild): Secondary | ICD-10-CM | POA: Diagnosis not present

## 2022-09-26 DIAGNOSIS — R7989 Other specified abnormal findings of blood chemistry: Secondary | ICD-10-CM | POA: Diagnosis not present

## 2022-10-03 ENCOUNTER — Encounter: Payer: Self-pay | Admitting: *Deleted

## 2022-10-03 ENCOUNTER — Other Ambulatory Visit: Payer: Self-pay

## 2022-10-03 DIAGNOSIS — Z006 Encounter for examination for normal comparison and control in clinical research program: Secondary | ICD-10-CM

## 2022-10-07 ENCOUNTER — Encounter (HOSPITAL_COMMUNITY): Payer: Self-pay

## 2022-10-09 NOTE — Research (Signed)
48 Week Follow-Up Visit Completed*   []  Not Necessary, No Potential Adverse Events Or Medication Issues Reported On Completed Subject Questionnaire   [x]  Yes, Contact With Subject/Alternate Contact Completed   []  Yes, No Contact With Subject/Alternate Contact Completed, But Electronic Health Record Was Reviewed   []  No, Unable To Contact Subject/Alternate Contact   Have you reviewed Ongoing medications on the Targeted Concomitant Medication form and updated the form as needed?   [x]  Yes   []  No   Subject Status*   [x]  Continuing In Follow-up   []  At Risk For Lost To Follow-up   []  Withdrawal From All Future Study Activities Including Passive Follow-up By Electronic Health Record Review Or Contact With Healthcare Provider Or Family Member/Friend   []  Death   Vital Status*   [x]  Alive   []  Deceased   []  Unknown   Last Known To Be Alive Source*   [x]  Subject Completed Follow-up Questionnaire/Seen In Person/Via Telephone Contact   []  Family Member or Caretaker   []  Primary Physician Or Medical Records   []  Publicly Available Source   []  Other  Date of last dose taken   20-Sep-2022  Over the last 12 weeks did the subject miss any doses?n/a  Over the last 12 weeks did the subject restart Evolocumab after an interruption?n/a

## 2022-11-14 ENCOUNTER — Ambulatory Visit (HOSPITAL_COMMUNITY): Payer: BC Managed Care – PPO | Attending: Internal Medicine

## 2022-11-14 DIAGNOSIS — I5020 Unspecified systolic (congestive) heart failure: Secondary | ICD-10-CM | POA: Insufficient documentation

## 2022-11-14 LAB — ECHOCARDIOGRAM COMPLETE
Area-P 1/2: 2.21 cm2
P 1/2 time: 345 ms
S' Lateral: 3.4 cm

## 2022-11-14 MED ORDER — PERFLUTREN LIPID MICROSPHERE
1.0000 mL | INTRAVENOUS | Status: AC | PRN
  Administered 2022-11-14: 1 mL via INTRAVENOUS

## 2022-11-15 DIAGNOSIS — H43393 Other vitreous opacities, bilateral: Secondary | ICD-10-CM | POA: Diagnosis not present

## 2022-11-15 DIAGNOSIS — H524 Presbyopia: Secondary | ICD-10-CM | POA: Diagnosis not present

## 2022-11-20 ENCOUNTER — Ambulatory Visit: Payer: BC Managed Care – PPO | Admitting: Internal Medicine

## 2022-11-20 NOTE — Progress Notes (Unsigned)
Cardiology Office Note:    Date:  11/22/2022   ID:  Alex Arias, DOB 1955/08/25, MRN [id number]  PCP:  Barbie Banner, MD   [facility] Providers Cardiologist:  Christell Constant, MD     Referring MD: Barbie Banner, MD   CC: CAD and HF  History of Present Illness:    Alex Arias is a 66 y.o. male with a hx of CAD with mid-distal LAD PCI, HLD, HTN, HFrEF 40-45%, PAF on eliquis.  Former Dr. Katrinka Blazing patient, transitioning to new cardiologist 2024. 2024: His anginal equivalent with sudden onset nausea and vomiting.  With diarrhea.  Felt like he was punched in the back.  Started ARNI; increase ARNI.  Echo showed no apical.  Discussed the use of AI scribe software for clinical note transcription with the patient, who gave verbal consent to proceed.  Alex Arias, a 67 year old with a history of LAD disease, heart failure, , reduced ejection fraction, and paroxysmal atrial fibrillation, presents for a follow-up visit. He has been on Eliquis and was started on ARNI, which was later increased. He reports no need for nitroglycerin and is currently in sinus rhythm.  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 Despite this, he reports feeling good and is active, though he expresses some concern about whether he is doing enough physical activity. He denies any symptoms of palpitations or unusual heartbeats.  He is on the highest tolerated dose of Entresto, Aldactone 25mg , Repatha, Jardiance, and a statin. His cholesterol levels are well-controlled. He is also on Plavix, which was prescribed after his stent placement a year ago. He has been tolerating his medications well with no reported side effects.   Past Medical History:  Diagnosis Date   Abdominal distension    Abdominal pain    Arrhythmia    Coronary artery disease involving native coronary artery of native heart with unstable angina pectoris (HCC)  10/30/2021   Cath-PCI 10/30/2021: 99% thrombotic SubTO of mLAD just after D2 -> DES PCI 3.0 x 16 Synergy XD => deployed to 3.0 mm.  2 big Diags, LCx-OM1 & Large Dominant RCA - rPDA & PAV-RPL1-3 free of disease. EF ~40-45%, LVEDP 21--25 mmHg mid-apical anterior & apical HK.   Cough    Family history of diabetes mellitus    Itching    NSTEMI (non-ST elevated myocardial infarction) (HCC) - + Troponin with Anterior & Inferior ST depressions => ongoing CP => Urgent Cath 10/30/2021   Cath with 99% subTO of LAD => DES PCI   Pleurisy    Presence of drug coated stent in LAD coronary artery 10/30/2021   mLAD 99% thrombotic lesion: DES PCI w/ Synergy XD 3 x 16 => deployed ~3-3.1 mm   Right inguinal hernia    Unspecified viral infection, in conditions classified elsewhere and of unspecified site    viral syndrome - per notes from [facility] dated 01/03/11    Past Surgical History:  Procedure Laterality Date   BOWEL RESECTION N/A 04/02/2014   Procedure: SMALL BOWEL RESECTION;  Surgeon: Violeta Gelinas, MD;  Location: [facility] OR;  Service: General;  Laterality: N/A;   CARDIAC CATHETERIZATION     CORONARY/GRAFT ACUTE MI REVASCULARIZATION N/A 10/30/2021   Procedure: Coronary/Graft Acute MI Revascularization;  Surgeon: Lyn Records, MD;  Location: [facility] INVASIVE CV LAB;  Service: Cardiovascular;  Laterality: N/A;   INGUINAL HERNIA REPAIR  02/01/2011   Procedure: LAPAROSCOPIC INGUINAL HERNIA;  Surgeon: Almond Lint, MD;  Location: [facility] OR;  Service: General;  Laterality: Bilateral;  Laparoscopic inguinal repair with mesh - Bilateral   INGUINAL HERNIA REPAIR Right 04/02/2014   Procedure: HERNIA REPAIR INGUINAL INCARCERATED ;  Surgeon: Violeta Gelinas, MD;  Location: [facility] OR;  Service: General;  Laterality: Right;   LEFT HEART CATH AND CORONARY ANGIOGRAPHY N/A 10/30/2021   Procedure: LEFT HEART CATH AND CORONARY ANGIOGRAPHY;  Surgeon: Lyn Records, MD;  Location: [facility] INVASIVE CV LAB;  Service: Cardiovascular;   Laterality: N/A;   WISDOM TOOTH EXTRACTION      Current Medications: Current Meds  Medication Sig   apixaban (ELIQUIS) 5 MG TABS tablet TAKE 1 TABLET BY MOUTH TWICE A DAY   atorvastatin (LIPITOR) 40 MG tablet Take 1 tablet (40 mg total) by mouth daily.   Cholecalciferol (VITAMIN D3 PO) Take 1 capsule by mouth every evening.   empagliflozin (JARDIANCE) 10 MG TABS tablet TAKE 1 TABLET BY MOUTH EVERY DAY   levothyroxine (SYNTHROID) 88 MCG tablet Take 88 mcg by mouth daily before breakfast.   Multiple Vitamin (MULITIVITAMIN WITH MINERALS) TABS Take 1 tablet by mouth every evening.   nitroGLYCERIN (NITROSTAT) 0.4 MG SL tablet Place 1 tablet (0.4 mg total) under the tongue every 5 (five) minutes as needed for chest pain.   pantoprazole (PROTONIX) 40 MG tablet Take 40 mg by mouth in the morning.   sacubitril-valsartan (ENTRESTO) 24-26 MG Take 1 tablet by mouth 2 (two) times daily.   spironolactone (ALDACTONE) 25 MG tablet Take 1 tablet (25 mg total) by mouth daily.   Study - EVOLVE-MI - evolocumab (REPATHA) 140 mg/mL SQ injection (PI-Stuckey) Inject 140 mg into the skin every 14 (fourteen) days. For Investigational Use Only. Inject subcutaneously into abdomen, thigh, or upper arm every 14 days. Rotate injection sites and do not inject into areas where skin is tender, bruised, or red. Please contact [facility] for any questions or concerns regarding this medication.   tadalafil (CIALIS) 5 MG tablet Take 5 mg by mouth every evening.   [DISCONTINUED] clopidogrel (PLAVIX) 75 MG tablet TAKE 1 TABLET BY MOUTH DAILY     Allergies:   Ace inhibitors   Social History   Socioeconomic History   Marital status: Married    Spouse name: Verlon Au   Number of children: 2   Years of education: Not on file   Highest education level: Master's degree (e.g., MA, MS, MEng, MEd, MSW, MBA)  Occupational History   Occupation: [employer]  Tobacco Use   Smoking status: Never   Smokeless tobacco:  Never  Vaping Use   Vaping status: Never Used  Substance and Sexual Activity   Alcohol use: Yes    Comment: 1 - 3 wine or beer weekly   Drug use: No   Sexual activity: Not on file  Other Topics Concern   Not on file  Social History Narrative   Not on file   Social Determinants of Health   Financial Resource Strain: Low Risk  (11/02/2021)   Overall Financial Resource Strain (CARDIA)    Difficulty of Paying Living Expenses: Not very hard  Food Insecurity: Low Risk  (09/13/2022)   Received from [facility]   Hunger Vital Sign    Worried About Running Out of Food in the Last Year: Never true    Ran Out of Food in the Last Year: Never true  Transportation Needs: No Transportation Needs (09/13/2022)   Received from [facility]  In the past 12 months, has lack of reliable transportation kept you from medical appointments, meetings, work or from getting things needed for daily living? : No  Physical Activity: Not on file  Stress: Not on file  Social Connections: Unknown (05/22/2021)   [facility] from [facility]   Social Network    Social Network: Not on file    Social: Occasionally bring 67 year old  Family History: Father, and mother had stroke. History of coronary artery disease notable for grandfather  ROS:   Please see the history of present illness.      EKGs/Labs/Other Studies Reviewed:    The following studies were reviewed today:   Cardiac Studies & Procedures   CARDIAC CATHETERIZATION  CARDIAC CATHETERIZATION 10/30/2021  Narrative CONCLUSIONS: 99% stenosis of the mid LAD after the large second diagonal.  TIMI grade I flow was noted in the vessel.  The vessel was treated with angioplasty and stenting using a 3.0 x 16 Synergy reducing the stenosis to 0% with improvement in TIMI flow to 2.5.  Sluggish flow was related to distal embolization and was treated with IV Aggrastat, IC verapamil, and nitroglycerin intracoronary. Right  dominant anatomy without significant obstruction. Left main is widely patent. Circumflex is small and widely patent. Mid to apical anterior wall severe hypokinesis.  EF 40 to 50%.  LVEDP greater than 22 mmHg.  RECOMMENDATIONS:  Preventive therapy with high intensity statin, beta-blocker, and ACE inhibitor therapy if tolerated. Dual antiplatelet therapy with aspirin and Brilinta.  Should continue high intensity antiplatelet treatment for at least 6 months and then perhaps Brilinta monotherapy or converting to clopidogrel monotherapy could be considered. Patient will need a 2D Doppler echocardiogram.  Findings Coronary Findings Diagnostic  Dominance: Right  Left Anterior Descending Mid LAD to Dist LAD lesion is 99% stenosed.  Intervention  Mid LAD to Dist LAD lesion Stent A drug-eluting stent was successfully placed. Post-Intervention Lesion Assessment The intervention was successful. Pre-interventional TIMI flow is 1. Post-intervention TIMI flow is 3. No complications occurred at this lesion. There is a 0% residual stenosis post intervention.     ECHOCARDIOGRAM  ECHOCARDIOGRAM COMPLETE 11/14/2022  Narrative ECHOCARDIOGRAM REPORT    Patient Name:   Alex Arias   Date of Exam: 11/14/2022 Medical Rec #:  [id number]     Height:       77.0 in Accession #:    [id number]    Weight:       218.0 lb Date of Birth:  July 29, 1955     BSA:          2.320 m Patient Age:    28 years      BP:           112/76 mmHg Patient Gender: M             HR:           54 bpm. Exam Location:  [address]  Procedure: 2D Echo, Cardiac Doppler, Color Doppler and Intracardiac Opacification Agent  Indications:    I50.20 heart failure with reduced EF  History:        Patient has prior history of Echocardiogram examinations, most recent 12/28/2021. CAD and NSTEMI; Risk Factors:Hypertension, HLD and Diabetes.  Sonographer:    Clearence Ped RCS Referring Phys: [id number] Daryan Cagley A  Ammarie Matsuura  IMPRESSIONS   1. There is no left ventricular thrombus (Definity contrast was used). 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 The apical segments are thin and hyperechoic consistent with scar of previous  LAD artery territory infarction. Left ventricular ejection fraction, by estimation, is 35 to 40%. The left ventricle has moderately decreased function. Left ventricular endocardial border not optimally defined to evaluate regional wall motion. Left ventricular diastolic parameters are consistent with Grade I diastolic dysfunction (impaired relaxation). 2. Right ventricular systolic function is normal. The right ventricular size is normal. Tricuspid regurgitation signal is inadequate for assessing PA pressure. 3. The mitral valve is normal in structure. No evidence of mitral valve regurgitation. No evidence of mitral stenosis. 4. The aortic valve is tricuspid. Aortic valve regurgitation is trivial. No aortic stenosis is present.  Comparison(s): Prior images reviewed side by side. The left ventricular function is unchanged. The left ventricular diastolic function is unchanged. The left ventricular wall motion abnormalities are unchanged.  FINDINGS Left Ventricle: There is no left ventricular thrombus (Definity contrast was used). 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 The apical segments are thin and hyperechoic consistent with scar of previous LAD artery territory infarction. Left ventricular ejection fraction, by estimation, is 35 to 40%. The left ventricle has moderately decreased function. Left ventricular endocardial border not optimally defined to evaluate regional wall motion. The left ventricular internal cavity size was normal in size. There is no left ventricular hypertrophy. Left ventricular diastolic parameters are consistent with Grade I diastolic dysfunction (impaired relaxation).   LV Wall Scoring: The apex is aneurysmal. The mid inferoseptal segment, apical septal segment, and  apical inferior segment are akinetic. The mid and distal anterior wall and mid anteroseptal segment are hypokinetic. The entire lateral wall, inferior wall, basal anteroseptal segment, basal anterior segment, and basal inferoseptal segment are normal.  Right Ventricle: The right ventricular size is normal. No increase in right ventricular wall thickness. Right ventricular systolic function is normal. Tricuspid regurgitation signal is inadequate for assessing PA pressure.  Left Atrium: Left atrial size was normal in size.  Right Atrium: Right atrial size was normal in size.  Pericardium: There is no evidence of pericardial effusion.  Mitral Valve: The mitral valve is normal in structure. There is mild thickening of the mitral valve leaflet(s). There is mild calcification of the mitral valve leaflet(s). No evidence of mitral valve regurgitation. No evidence of mitral valve stenosis.  Tricuspid Valve: The tricuspid valve is normal in structure. Tricuspid valve regurgitation is not demonstrated.  Aortic Valve: The aortic valve is tricuspid. Aortic valve regurgitation is trivial. Aortic regurgitation PHT measures 345 msec. No aortic stenosis is present.  Pulmonic Valve: The pulmonic valve was normal in structure. Pulmonic valve regurgitation is trivial. No evidence of pulmonic stenosis.  Aorta: The aortic root and ascending aorta are structurally normal, with no evidence of dilitation.  Venous: The inferior vena cava was not well visualized.  IAS/Shunts: No atrial level shunt detected by color flow Doppler.   LEFT VENTRICLE PLAX 2D LVIDd:         5.20 cm   Diastology LVIDs:         3.40 cm   LV e' medial:    9.03 cm/s LV PW:         1.00 cm   LV E/e' medial:  6.6 LV IVS:        0.80 cm   LV e' lateral:   6.64 cm/s LVOT diam:     2.10 cm   LV E/e' lateral: 9.0 LV SV:         85 LV SV Index:   36 LVOT Area:     3.46 cm   RIGHT VENTRICLE RV  Basal diam:  3.60 cm RV S prime:      15.20 cm/s TAPSE (M-mode): 2.8 cm  LEFT ATRIUM             Index        RIGHT ATRIUM           Index LA diam:        3.40 cm 1.47 cm/m   RA Area:     18.70 cm LA Vol (A2C):   68.3 ml 29.44 ml/m  RA Volume:   51.80 ml  22.33 ml/m LA Vol (A4C):   47.1 ml 20.30 ml/m LA Biplane Vol: 57.5 ml 24.78 ml/m AORTIC VALVE LVOT Vmax:   121.00 cm/s LVOT Vmean:  69.000 cm/s LVOT VTI:    0.244 m AI PHT:      345 msec  AORTA Ao Root diam: 3.70 cm Ao Asc diam:  3.60 cm  MITRAL VALVE MV Area (PHT):             SHUNTS MV Decel Time:             Systemic VTI:  0.24 m MV E velocity: 59.70 cm/s  Systemic Diam: 2.10 cm MV A velocity: 64.50 cm/s MV E/A ratio:  0.93  Mihai Croitoru MD Electronically signed by Thurmon Fair MD Signature Date/Time: 11/14/2022/6:49:44 PM    Final    MONITORS  LONG TERM MONITOR (3-14 DAYS) 03/29/2022  Narrative   Patient had a minimum heart rate of 38 bpm, maximum heart rate of 240 bpm, and average heart rate of 63 bpm.   Predominant underlying rhythm was sinus rhythm.   Rare 4-5 beat runs of NSVT   Small runs of SVT, longest at 11 seconds.   Isolated PACs were rare (<1.0%).   Isolated PVCs were occasional (1.0%).   No triggered and diary events.  Asymptomatic occasional PVCs and NSVT.            Recent Labs: 01/27/2022: BUN 14; Creatinine, Ser 1.22; Hemoglobin 17.1; NT-Pro BNP 217; Platelets 203; Potassium 4.8; Sodium 145  Recent Lipid Panel    Component Value Date/Time   CHOL 87 (L) 01/27/2022 1622   TRIG 83 01/27/2022 1622   HDL 53 01/27/2022 1622   CHOLHDL 1.6 01/27/2022 1622   CHOLHDL 2.8 10/31/2021 0232   VLDL 21 10/31/2021 0232   LDLCALC 17 01/27/2022 1622     Risk Assessment/Calculations:    CHA2DS2-VASc Score = 4   This indicates a 4.8% annual risk of stroke. The patient's score is based upon: CHF History: 1 HTN History: 1 Diabetes History: 0 Stroke History: 0 Vascular Disease History: 1 Age Score: 1 Gender Score: 0              Physical Exam:    VS:  BP 110/64   Pulse 62   Ht 6\' 5"  (1.956 m)   Wt 220 lb 3.2 oz (99.9 kg)   SpO2 94%   BMI 26.11 kg/m     Wt Readings from Last 3 Encounters:  11/22/22 220 lb 3.2 oz (99.9 kg)  06/02/22 218 lb (98.9 kg)  04/10/22 216 lb 0.8 oz (98 kg)    GEN:  Well nourished, well developed in no acute distress HEENT: Normal NECK: No JVD CARDIAC: RRR, no murmurs, rubs, gallops RESPIRATORY:  Clear to auscultation without rales, wheezing or rhonchi  ABDOMEN: Soft, non-tender, non-distended MUSCULOSKELETAL:  Trace right leg edema; No deformity  SKIN: Warm and dry NEUROLOGIC:  Alert and oriented x 3 PSYCHIATRIC:  Normal affect   ASSESSMENT:    1. Coronary artery disease involving native coronary artery of native heart with unstable angina pectoris (HCC)   2. PAF (paroxysmal atrial fibrillation) (HCC)   3. HFrEF (heart failure with reduced ejection fraction) (HCC)   4. Essential hypertension   5. Acquired thrombophilia (HCC)      PLAN:     Coronary Artery Disease (CAD) History of NSTEMI with PCI. No current symptoms. Cholesterol well controlled on Repatha and statin. -Discontinue Plavix as it has been over a year since PCI. -Continue Repatha and statin for cholesterol management. - goal LDL < 55, on PCSK9i as part of EVOLVE trial LDL 17 - on AC in the LV apical aneurysm and AF  Heart Failure with Reduced Ejection Fraction (HFrEF- chronic combined) - EF stable, currently on highest tolerated dose of Entresto and Aldactone. - NYHA I and euvolemic with no need for PRN lasix -Continue current regimen of Entresto and Aldactone and SGLT2i - no BB due to low resting HR  Paroxysmal Atrial Fibrillation Hx of asymptomatic SVT Currently in sinus rhythm, on Eliquis for anticoagulation. -Continue Eliquis;  CHADVASC NA  OSA - as per his sleep MD  General Health Maintenance / Followup Plans -Encourage regular exercise, gradually increasing intensity as  tolerated. -Return in 1 year unless new cardiac symptoms develop.            Medication Adjustments/Labs and Tests Ordered: Current medicines are reviewed at length with the patient today.  Concerns regarding medicines are outlined above.  Orders Placed This Encounter  Procedures   EKG 12-Lead   No orders of the defined types were placed in this encounter.   Patient Instructions  Medication Instructions:  STOP PLAVIX *If you need a refill on your cardiac medications before your next appointment, please call your pharmacy*   Lab Work:  If you have labs (blood work) drawn today and your tests are completely normal, you will receive your results only by: MyChart Message (if you have MyChart) OR A paper copy in the mail If you have any lab test that is abnormal or we need to change your treatment, we will call you to review the results.   Testing/Procedures:    Follow-Up: At [facility], you and your health needs are our priority.  As part of our continuing mission to provide you with exceptional heart care, we have created designated Provider Care Teams.  These Care Teams include your primary Cardiologist (physician) and Advanced Practice Providers (APPs -  Physician Assistants and Nurse Practitioners) who all work together to provide you with the care you need, when you need it.  We recommend signing up for the patient portal called "MyChart".  Sign up information is provided on this After Visit Summary.  MyChart is used to connect with patients for Virtual Visits (Telemedicine).  Patients are able to view lab/test results, encounter notes, upcoming appointments, etc.  Non-urgent messages can be sent to your provider as well.   To learn more about what you can do with MyChart, go to [email or web address].    Your next appointment:   1 year(s)  Provider:   Christell Constant, MD     Other Instructions     Signed, Christell Constant, MD   11/22/2022 3:30 PM    [facility]

## 2022-11-22 ENCOUNTER — Ambulatory Visit: Payer: BC Managed Care – PPO | Attending: Internal Medicine | Admitting: Internal Medicine

## 2022-11-22 ENCOUNTER — Encounter: Payer: Self-pay | Admitting: Internal Medicine

## 2022-11-22 VITALS — BP 110/64 | HR 62 | Ht 77.0 in | Wt 220.2 lb

## 2022-11-22 DIAGNOSIS — I5020 Unspecified systolic (congestive) heart failure: Secondary | ICD-10-CM

## 2022-11-22 DIAGNOSIS — I480 Paroxysmal atrial fibrillation: Secondary | ICD-10-CM

## 2022-11-22 DIAGNOSIS — I10 Essential (primary) hypertension: Secondary | ICD-10-CM | POA: Diagnosis not present

## 2022-11-22 DIAGNOSIS — I25110 Atherosclerotic heart disease of native coronary artery with unstable angina pectoris: Secondary | ICD-10-CM

## 2022-11-22 DIAGNOSIS — D6869 Other thrombophilia: Secondary | ICD-10-CM

## 2022-11-22 NOTE — Patient Instructions (Signed)
Medication Instructions:  STOP PLAVIX *If you need a refill on your cardiac medications before your next appointment, please call your pharmacy*   Lab Work:  If you have labs (blood work) drawn today and your tests are completely normal, you will receive your results only by: MyChart Message (if you have MyChart) OR A paper copy in the mail If you have any lab test that is abnormal or we need to change your treatment, we will call you to review the results.   Testing/Procedures:    Follow-Up: At [facility], you and your health needs are our priority.  As part of our continuing mission to provide you with exceptional heart care, we have created designated Provider Care Teams.  These Care Teams include your primary Cardiologist (physician) and Advanced Practice Providers (APPs -  Physician Assistants and Nurse Practitioners) who all work together to provide you with the care you need, when you need it.  We recommend signing up for the patient portal called "MyChart".  Sign up information is provided on this After Visit Summary.  MyChart is used to connect with patients for Virtual Visits (Telemedicine).  Patients are able to view lab/test results, encounter notes, upcoming appointments, etc.  Non-urgent messages can be sent to your provider as well.   To learn more about what you can do with MyChart, go to [email or web address].    Your next appointment:   1 year(s)  Provider:   Christell Constant, MD     Other Instructions

## 2022-11-23 ENCOUNTER — Other Ambulatory Visit: Payer: Self-pay

## 2022-12-05 ENCOUNTER — Telehealth: Payer: Self-pay | Admitting: Internal Medicine

## 2022-12-05 NOTE — Telephone Encounter (Signed)
Pt c/o of Chest Pain: STAT if CP now or developed within 24 hours  1. Are you having CP right now? Yes  2. Are you experiencing any other symptoms (ex. SOB, nausea, vomiting, sweating)? No  3. How long have you been experiencing CP? About a week now  4. Is your CP continuous or coming and going? Continuous   5. Have you taken Nitroglycerin? No ?

## 2022-12-05 NOTE — Telephone Encounter (Signed)
Call transferred into triage for chest pain.  Pt reports that for about 1 week anytime he stretches the shoulders or leans back in a chair across his shoulders and top of chest hurt..  When he is still he feels "perfectly fine".  He recalls having the heart attack that he had back pain during the night as his presenting symptoms so this concerned him.  He has not tried anything for the pain.  After discussion he recalls that his 70 year old granddaughter was visiting for 4 days and he picked her up all the time and thinks this started around that time.  He is going to take 2 tylenol now and I'll call him back in about 2 hours to check on him.

## 2022-12-05 NOTE — Telephone Encounter (Signed)
Patient returned call.  He took 1000 mg tylenol and feels the pain is better by at least 50%.  He was out doing "light raking" of leaves since he took the medicine.  He tolerated that fine and has no shortness of breath.  I offered to place the order for the stress test.  He would like to hold on this for now.  If symptoms worsen or do not improve he will call back or reach out to PCP for further evaluation.

## 2022-12-05 NOTE — Telephone Encounter (Signed)
Left message for patient to call back  

## 2023-01-09 DIAGNOSIS — J020 Streptococcal pharyngitis: Secondary | ICD-10-CM | POA: Diagnosis not present

## 2023-01-09 DIAGNOSIS — J029 Acute pharyngitis, unspecified: Secondary | ICD-10-CM | POA: Diagnosis not present

## 2023-01-12 DIAGNOSIS — R509 Fever, unspecified: Secondary | ICD-10-CM | POA: Diagnosis not present

## 2023-01-12 DIAGNOSIS — J209 Acute bronchitis, unspecified: Secondary | ICD-10-CM | POA: Diagnosis not present

## 2023-01-12 DIAGNOSIS — U071 COVID-19: Secondary | ICD-10-CM | POA: Diagnosis not present

## 2023-01-12 DIAGNOSIS — J069 Acute upper respiratory infection, unspecified: Secondary | ICD-10-CM | POA: Diagnosis not present

## 2023-01-19 DIAGNOSIS — J069 Acute upper respiratory infection, unspecified: Secondary | ICD-10-CM | POA: Diagnosis not present

## 2023-01-22 ENCOUNTER — Encounter: Payer: Self-pay | Admitting: *Deleted

## 2023-01-22 DIAGNOSIS — Z006 Encounter for examination for normal comparison and control in clinical research program: Secondary | ICD-10-CM

## 2023-01-23 ENCOUNTER — Other Ambulatory Visit: Payer: Self-pay | Admitting: Physician Assistant

## 2023-01-23 NOTE — Research (Signed)
 60 Week-Follow-Up Visit Completed*   []  Not Necessary, No Potential Adverse Events Or Medication Issues Reported On Completed Subject Questionnaire   [x]  Yes, Contact With Subject/Alternate Contact Completed   []  Yes, No Contact With Subject/Alternate Contact Completed, But Electronic Health Record Was Reviewed   []  No, Unable To Contact Subject/Alternate Contact   Have you reviewed Ongoing medications on the Targeted Concomitant Medication form and updated the form as needed?   [x]  Yes   []  No   Subject Status*   [x]  Continuing In Follow-up   []  At Risk For Lost To Follow-up   []  Withdrawal From All Future Study Activities Including Passive Follow-up By Electronic Health Record Review Or Contact With Healthcare Provider Or Family Member/Friend   []  Death   Vital Status*   [x]  Alive   []  Deceased   []  Unknown   Last Known To Be Alive Source*   [x]  Subject Completed Follow-up Questionnaire/Seen In Person/Via Telephone Contact   []  Family Member or Caretaker   []  Primary Physician Or Medical Records   []  Publicly Available Source   []  Other  Date of last dose taken   22-Jan-2023  Over the last 12 weeks did the subject miss any doses?n/a  Over the last 12 weeks did the subject restart Evolocumab  after an interruption?n/a

## 2023-01-30 ENCOUNTER — Other Ambulatory Visit: Payer: Self-pay | Admitting: Internal Medicine

## 2023-02-12 ENCOUNTER — Other Ambulatory Visit: Payer: Self-pay | Admitting: Internal Medicine

## 2023-03-07 ENCOUNTER — Other Ambulatory Visit: Payer: Self-pay

## 2023-03-12 ENCOUNTER — Other Ambulatory Visit: Payer: Self-pay

## 2023-03-14 ENCOUNTER — Other Ambulatory Visit: Payer: Self-pay

## 2023-03-17 ENCOUNTER — Other Ambulatory Visit: Payer: Self-pay | Admitting: Internal Medicine

## 2023-03-20 ENCOUNTER — Other Ambulatory Visit: Payer: Self-pay | Admitting: Internal Medicine

## 2023-03-20 ENCOUNTER — Other Ambulatory Visit (HOSPITAL_COMMUNITY): Payer: Self-pay

## 2023-03-20 ENCOUNTER — Other Ambulatory Visit (HOSPITAL_COMMUNITY): Payer: Self-pay | Admitting: Pharmacist

## 2023-03-20 ENCOUNTER — Other Ambulatory Visit: Payer: Self-pay

## 2023-03-20 ENCOUNTER — Telehealth: Payer: Self-pay | Admitting: Internal Medicine

## 2023-03-20 MED ORDER — REPATHA SURECLICK 140 MG/ML ~~LOC~~ SOAJ
140.0000 mg | SUBCUTANEOUS | 2 refills | Status: DC
  Filled 2023-03-20: qty 12, 168d supply, fill #0
  Filled 2023-08-23 – 2023-08-28 (×2): qty 12, 168d supply, fill #1
  Filled 2024-02-04: qty 12, 168d supply, fill #2

## 2023-03-20 NOTE — Telephone Encounter (Signed)
 Spoke with wife per DPR and she is aware patient has 3 refills on his spironolactone. Advised to contact pharmacy

## 2023-03-20 NOTE — Progress Notes (Signed)
 Specialty Pharmacy Refill Coordination Note  Alex Arias is a 68 y.o. male contacted today regarding refills of specialty medication(s) Evolocumab (Repatha SureClick)   Patient requested Daryll Drown at [facility] [place] date: 03/20/23   Medication will be filled on 03/20/23.

## 2023-03-20 NOTE — Telephone Encounter (Signed)
 Pt c/o medication issue:  1. Name of Medication: spironolactone (ALDACTONE) 25 MG tablet   2. How are you currently taking this medication (dosage and times per day)?  Take 1 tablet (25 mg total) by mouth daily.       3. Are you having a reaction (difficulty breathing--STAT)? No  4. What is your medication issue? Pt's spouse is requesting a callback regarding this medication refill being denied but she's unsure why when the pt should be taking this. She'd like to discuss further once called back. Please advise

## 2023-03-21 DIAGNOSIS — N401 Enlarged prostate with lower urinary tract symptoms: Secondary | ICD-10-CM | POA: Diagnosis not present

## 2023-03-21 DIAGNOSIS — R3914 Feeling of incomplete bladder emptying: Secondary | ICD-10-CM | POA: Diagnosis not present

## 2023-03-26 ENCOUNTER — Other Ambulatory Visit: Payer: Self-pay

## 2023-03-28 ENCOUNTER — Encounter: Payer: Self-pay | Admitting: Internal Medicine

## 2023-03-28 DIAGNOSIS — I480 Paroxysmal atrial fibrillation: Secondary | ICD-10-CM

## 2023-03-28 MED ORDER — APIXABAN 5 MG PO TABS: 5.0000 mg | ORAL_TABLET | Freq: Two times a day (BID) | ORAL | 1 refills | Status: DC

## 2023-04-02 ENCOUNTER — Encounter: Payer: Self-pay | Admitting: *Deleted

## 2023-04-02 DIAGNOSIS — Z006 Encounter for examination for normal comparison and control in clinical research program: Secondary | ICD-10-CM

## 2023-05-18 NOTE — Research (Signed)
 72 Week Follow-Up Visit Completed*   []  Not Necessary, No Potential Adverse Events Or Medication Issues Reported On Completed Subject Questionnaire   []  Yes, Contact With Subject/Alternate Contact Completed   [x]  Yes, No Contact With Subject/Alternate Contact Completed, But Electronic Health Record Was Reviewed   []  No, Unable To Contact Subject/Alternate Contact   Have you reviewed Ongoing medications on the Targeted Concomitant Medication form and updated the form as needed?   [x]  Yes   []  No   Subject Status*   [x]  Continuing In Follow-up   []  At Risk For Lost To Follow-up   []  Withdrawal From All Future Study Activities Including Passive Follow-up By Electronic Health Record Review Or Contact With Healthcare Provider Or Family Member/Friend   []  Death   Vital Status*   [x]  Alive   []  Deceased   []  Unknown   Last Known To Be Alive Source*   []  Subject Completed Follow-up Questionnaire/Seen In Person/Via Telephone Contact   []  Family Member or Caretaker   [x]  Primary Physician Or Medical Records   []  Publicly Available Source   []  Other

## 2023-05-28 ENCOUNTER — Encounter: Payer: Self-pay | Admitting: Internal Medicine

## 2023-05-28 ENCOUNTER — Encounter: Payer: Self-pay | Admitting: *Deleted

## 2023-05-28 DIAGNOSIS — Z006 Encounter for examination for normal comparison and control in clinical research program: Secondary | ICD-10-CM

## 2023-05-28 MED ORDER — PANTOPRAZOLE SODIUM 40 MG PO TBEC: 40.0000 mg | DELAYED_RELEASE_TABLET | Freq: Every morning | ORAL | 3 refills | Status: AC

## 2023-06-06 DIAGNOSIS — R519 Headache, unspecified: Secondary | ICD-10-CM | POA: Diagnosis not present

## 2023-06-06 DIAGNOSIS — Z03818 Encounter for observation for suspected exposure to other biological agents ruled out: Secondary | ICD-10-CM | POA: Diagnosis not present

## 2023-06-06 DIAGNOSIS — J00 Acute nasopharyngitis [common cold]: Secondary | ICD-10-CM | POA: Diagnosis not present

## 2023-06-08 DIAGNOSIS — H109 Unspecified conjunctivitis: Secondary | ICD-10-CM | POA: Diagnosis not present

## 2023-06-27 NOTE — Research (Signed)
 84 WEEK Follow-Up Visit Completed*   []  Not Necessary, No Potential Adverse Events Or Medication Issues Reported On Completed Subject Questionnaire   []  Yes, Contact With Subject/Alternate Contact Completed   [x]  Yes, No Contact With Subject/Alternate Contact Completed, But Electronic Health Record Was Reviewed   []  No, Unable To Contact Subject/Alternate Contact   Have you reviewed Ongoing medications on the Targeted Concomitant Medication form and updated the form as needed?   [x]  Yes   []  No   Subject Status*   [x]  Continuing In Follow-up   []  At Risk For Lost To Follow-up   []  Withdrawal From All Future Study Activities Including Passive Follow-up By Electronic Health Record Review Or Contact With Healthcare Provider Or Family Member/Friend   []  Death   Vital Status*   [x]  Alive   []  Deceased   []  Unknown   Last Known To Be Alive Source*   []  Subject Completed Follow-up Questionnaire/Seen In Person/Via Telephone Contact   []  Family Member or Caretaker   [x]  Primary Physician Or Medical Records   []  Publicly Available Source   []  Other

## 2023-06-29 DIAGNOSIS — J020 Streptococcal pharyngitis: Secondary | ICD-10-CM | POA: Diagnosis not present

## 2023-06-29 DIAGNOSIS — R0981 Nasal congestion: Secondary | ICD-10-CM | POA: Diagnosis not present

## 2023-07-03 DIAGNOSIS — E782 Mixed hyperlipidemia: Secondary | ICD-10-CM | POA: Diagnosis not present

## 2023-07-03 DIAGNOSIS — I10 Essential (primary) hypertension: Secondary | ICD-10-CM | POA: Diagnosis not present

## 2023-07-03 DIAGNOSIS — R351 Nocturia: Secondary | ICD-10-CM | POA: Diagnosis not present

## 2023-07-03 DIAGNOSIS — E038 Other specified hypothyroidism: Secondary | ICD-10-CM | POA: Diagnosis not present

## 2023-07-04 DIAGNOSIS — Z23 Encounter for immunization: Secondary | ICD-10-CM | POA: Diagnosis not present

## 2023-07-04 DIAGNOSIS — I110 Hypertensive heart disease with heart failure: Secondary | ICD-10-CM | POA: Diagnosis not present

## 2023-07-04 DIAGNOSIS — E038 Other specified hypothyroidism: Secondary | ICD-10-CM | POA: Diagnosis not present

## 2023-07-04 DIAGNOSIS — E785 Hyperlipidemia, unspecified: Secondary | ICD-10-CM | POA: Diagnosis not present

## 2023-07-04 DIAGNOSIS — Z0000 Encounter for general adult medical examination without abnormal findings: Secondary | ICD-10-CM | POA: Diagnosis not present

## 2023-07-04 DIAGNOSIS — I2510 Atherosclerotic heart disease of native coronary artery without angina pectoris: Secondary | ICD-10-CM | POA: Diagnosis not present

## 2023-08-01 ENCOUNTER — Other Ambulatory Visit: Payer: Self-pay

## 2023-08-01 DIAGNOSIS — I480 Paroxysmal atrial fibrillation: Secondary | ICD-10-CM

## 2023-08-01 MED ORDER — APIXABAN 5 MG PO TABS: 5.0000 mg | ORAL_TABLET | Freq: Two times a day (BID) | ORAL | 1 refills | Status: DC

## 2023-08-01 NOTE — Telephone Encounter (Signed)
 Prescription refill request for Eliquis  received. Indication: Afib  Last office visit:11/22/22 ([place])  Scr: 0.95 (07/03/23)  Age: 68 Weight: 99.9kg  Appropriate dose. Refill sent.

## 2023-08-21 ENCOUNTER — Other Ambulatory Visit: Payer: Self-pay

## 2023-08-23 ENCOUNTER — Other Ambulatory Visit: Payer: Self-pay

## 2023-08-24 ENCOUNTER — Other Ambulatory Visit (HOSPITAL_COMMUNITY): Payer: Self-pay

## 2023-08-27 ENCOUNTER — Other Ambulatory Visit (HOSPITAL_COMMUNITY): Payer: Self-pay

## 2023-08-27 ENCOUNTER — Other Ambulatory Visit: Payer: Self-pay

## 2023-08-28 ENCOUNTER — Other Ambulatory Visit (HOSPITAL_COMMUNITY): Payer: Self-pay

## 2023-08-28 ENCOUNTER — Other Ambulatory Visit: Payer: Self-pay

## 2023-08-29 ENCOUNTER — Other Ambulatory Visit (HOSPITAL_COMMUNITY): Payer: Self-pay

## 2023-08-30 ENCOUNTER — Other Ambulatory Visit (HOSPITAL_COMMUNITY): Payer: Self-pay

## 2023-09-04 ENCOUNTER — Other Ambulatory Visit (HOSPITAL_COMMUNITY): Payer: Self-pay

## 2023-09-14 DIAGNOSIS — U071 COVID-19: Secondary | ICD-10-CM | POA: Diagnosis not present

## 2023-09-14 DIAGNOSIS — H6123 Impacted cerumen, bilateral: Secondary | ICD-10-CM | POA: Diagnosis not present

## 2023-09-14 DIAGNOSIS — R509 Fever, unspecified: Secondary | ICD-10-CM | POA: Diagnosis not present

## 2023-09-14 DIAGNOSIS — R0981 Nasal congestion: Secondary | ICD-10-CM | POA: Diagnosis not present

## 2023-09-24 ENCOUNTER — Telehealth: Payer: Self-pay | Admitting: *Deleted

## 2023-09-24 NOTE — Telephone Encounter (Signed)
 Called patient for his week 96 visit for the EVOLVE study left message for patient to call back.     [place] Genea Rheaume, Research Coordinator 09/24/2023

## 2023-09-26 ENCOUNTER — Encounter: Payer: Self-pay | Admitting: *Deleted

## 2023-09-26 DIAGNOSIS — Z006 Encounter for examination for normal comparison and control in clinical research program: Secondary | ICD-10-CM

## 2023-09-26 DIAGNOSIS — N401 Enlarged prostate with lower urinary tract symptoms: Secondary | ICD-10-CM | POA: Diagnosis not present

## 2023-09-26 DIAGNOSIS — N182 Chronic kidney disease, stage 2 (mild): Secondary | ICD-10-CM | POA: Diagnosis not present

## 2023-09-26 DIAGNOSIS — I5020 Unspecified systolic (congestive) heart failure: Secondary | ICD-10-CM | POA: Diagnosis not present

## 2023-09-26 DIAGNOSIS — I129 Hypertensive chronic kidney disease with stage 1 through stage 4 chronic kidney disease, or unspecified chronic kidney disease: Secondary | ICD-10-CM | POA: Diagnosis not present

## 2023-09-26 NOTE — Research (Signed)
 Week 96 Follow-Up Visit Completed*   []  Not Necessary, No Potential Adverse Events Or Medication Issues Reported On Completed Subject Questionnaire   [x]  Yes, Contact With Subject/Alternate Contact Completed   []  Yes, No Contact With Subject/Alternate Contact Completed, But Electronic Health Record Was Reviewed   []  No, Unable To Contact Subject/Alternate Contact   Have you reviewed Ongoing medications on the Targeted Concomitant Medication form and updated the form as needed?   [x]  Yes   []  No   Subject Status*   [x]  Continuing In Follow-up   []  At Risk For Lost To Follow-up   []  Withdrawal From All Future Study Activities Including Passive Follow-up By Electronic Health Record Review Or Contact With Healthcare Provider Or Family Member/Friend   []  Death   Vital Status*   [x]  Alive   []  Deceased   []  Unknown   Last Known To Be Alive Source*   [x]  Subject Completed Follow-up Questionnaire/Seen In Person/Via Telephone Contact   []  Family Member or Caretaker   []  Primary Physician Or Medical Records   []  Publicly Available Source   []  Other  Date of last dose taken   18-Sep-2023  Over the last 12 weeks did the subject miss any doses? No  Over the last 12 weeks did the subject restart Evolocumab  after an interruption? No

## 2023-10-03 DIAGNOSIS — M545 Low back pain, unspecified: Secondary | ICD-10-CM | POA: Diagnosis not present

## 2023-10-03 DIAGNOSIS — M47815 Spondylosis without myelopathy or radiculopathy, thoracolumbar region: Secondary | ICD-10-CM | POA: Diagnosis not present

## 2023-10-16 DIAGNOSIS — L570 Actinic keratosis: Secondary | ICD-10-CM | POA: Diagnosis not present

## 2023-10-16 DIAGNOSIS — D225 Melanocytic nevi of trunk: Secondary | ICD-10-CM | POA: Diagnosis not present

## 2023-10-16 DIAGNOSIS — L820 Inflamed seborrheic keratosis: Secondary | ICD-10-CM | POA: Diagnosis not present

## 2023-10-16 DIAGNOSIS — L821 Other seborrheic keratosis: Secondary | ICD-10-CM | POA: Diagnosis not present

## 2023-10-16 DIAGNOSIS — L814 Other melanin hyperpigmentation: Secondary | ICD-10-CM | POA: Diagnosis not present

## 2023-10-16 DIAGNOSIS — L918 Other hypertrophic disorders of the skin: Secondary | ICD-10-CM | POA: Diagnosis not present

## 2023-11-14 ENCOUNTER — Other Ambulatory Visit: Payer: Self-pay

## 2023-11-16 ENCOUNTER — Other Ambulatory Visit: Payer: Self-pay | Admitting: Internal Medicine

## 2023-11-21 ENCOUNTER — Telehealth: Payer: Self-pay | Admitting: Internal Medicine

## 2023-11-21 MED ORDER — SACUBITRIL-VALSARTAN 24-26 MG PO TABS: 1.0000 | ORAL_TABLET | Freq: Two times a day (BID) | ORAL | 0 refills | Status: DC

## 2023-11-21 NOTE — Telephone Encounter (Signed)
 Pt's medication was sent to pt's pharmacy as requested. Confirmation received.

## 2023-11-21 NOTE — Telephone Encounter (Signed)
*  STAT* If patient is at the pharmacy, call can be transferred to refill team.   1. Which medications need to be refilled? (please list name of each medication and dose if known) sacubitril -valsartan  (ENTRESTO ) 24-26 MG   2. Would you like to learn more about the convenience, safety, & potential cost savings by using the [facility]?   3. Are you open to using the [facility] (Type [facility]. ).  4. Which pharmacy/location (including street and city if local pharmacy) is medication to be sent to? [id number] - [place], [place] - [address]   5. Do they need a 30 day or 90 day supply? 90 day Pharmacy is telling him he has no more refills.

## 2023-11-22 ENCOUNTER — Ambulatory Visit: Attending: Internal Medicine | Admitting: Internal Medicine

## 2023-11-22 ENCOUNTER — Encounter: Payer: Self-pay | Admitting: Internal Medicine

## 2023-11-22 VITALS — BP 110/70 | HR 56 | Ht 77.0 in | Wt 216.0 lb

## 2023-11-22 DIAGNOSIS — I480 Paroxysmal atrial fibrillation: Secondary | ICD-10-CM | POA: Diagnosis not present

## 2023-11-22 DIAGNOSIS — I5020 Unspecified systolic (congestive) heart failure: Secondary | ICD-10-CM

## 2023-11-22 DIAGNOSIS — I25110 Atherosclerotic heart disease of native coronary artery with unstable angina pectoris: Secondary | ICD-10-CM | POA: Diagnosis not present

## 2023-11-22 DIAGNOSIS — I10 Essential (primary) hypertension: Secondary | ICD-10-CM

## 2023-11-22 NOTE — Patient Instructions (Signed)
 Medication Instructions:  Your physician recommends that you continue on your current medications as directed. Please refer to the Current Medication list given to you today.  *If you need a refill on your cardiac medications before your next appointment, please call your pharmacy*  Testing/Procedures: Exercise Tolerance Test Your physician has requested that you have an exercise tolerance test. For further information please visit [email or web address] Please also follow instruction sheet, as given.  Follow-Up: At [facility], you and your health needs are our priority.  As part of our continuing mission to provide you with exceptional heart care, our providers are all part of one team.  This team includes your primary Cardiologist (physician) and Advanced Practice Providers or APPs (Physician Assistants and Nurse Practitioners) who all work together to provide you with the care you need, when you need it.  Your next appointment:   1 year  Provider:   Stanly DELENA Leavens, MD

## 2023-11-22 NOTE — Progress Notes (Signed)
 Cardiology Office Note:    Date:  11/22/2023   ID:  Alex Arias, DOB 01-05-1956, MRN [id number]  PCP:  Tanda Prentice DEL, MD   [facility] Providers Cardiologist:  Stanly DELENA Leavens, MD     Referring MD: Tanda Prentice DEL, MD   CC: CAD and HF  History of Present Illness:    Alex Arias is a 68 y.o. male with a hx of CAD with mid-distal LAD PCI, HLD, HTN, HFrEF 40-45%, PAF on eliquis .  Former Dr. Claudene patient, transitioning to new cardiologist 2024. 2024: His anginal equivalent with sudden onset nausea and vomiting.  With diarrhea.  Felt like he was punched in the back.  Started ARNI; increase ARNI.  Echo showed no apical thrombus.    Alex Arias is a 68 year old male with coronary artery disease, heart failure with reduced ejection fraction, and paroxysmal atrial fibrillation who presents for follow-up of his cardiovascular conditions.  He has a history of coronary artery disease with a prior percutaneous coronary intervention to the mid-distal left anterior descending artery. 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  His most recent echocardiogram on November 14, 2022, showed an ejection fraction of 35-40%. He experiences occasional shortness of breath with exertion, such as when climbing stairs. He remains active with yard work but lacks a structured exercise routine.  He has paroxysmal atrial fibrillation and is on Eliquis  (apixaban ) for anticoagulation. He reports no issues with Eliquis  and has not experienced any atrial fibrillation episodes recently.  He has a history of hyperlipidemia and hypertension. He previously weaned off Protonix  for reflux and only occasionally experiences symptoms, which he manages with a half pill as needed.  Socially, he has been with [employer] for 36 years, starting with a franchise in [place] . He remains active in his work and enjoys  spending time with family and grandchildren.  Discussed the use of AI scribe software for clinical note transcription with the patient, who gave verbal consent to proceed.   Past Medical History:  Diagnosis Date   Abdominal distension    Abdominal pain    Arrhythmia    Coronary artery disease involving native coronary artery of native heart with unstable angina pectoris (HCC) 10/30/2021   Cath-PCI 10/30/2021: 99% thrombotic SubTO of mLAD just after D2 -> DES PCI 3.0 x 16 Synergy XD => deployed to 3.0 mm.  2 big Diags, LCx-OM1 & Large Dominant RCA - rPDA & PAV-RPL1-3 free of disease. EF ~40-45%, LVEDP 21--25 mmHg mid-apical anterior & apical HK.   Cough    Family history of diabetes mellitus    Itching    NSTEMI (non-ST elevated myocardial infarction) (HCC) - + Troponin with Anterior & Inferior ST depressions => ongoing CP => Urgent Cath 10/30/2021   Cath with 99% subTO of LAD => DES PCI   Pleurisy    Presence of drug coated stent in LAD coronary artery 10/30/2021   mLAD 99% thrombotic lesion: DES PCI w/ Synergy XD 3 x 16 => deployed ~3-3.1 mm   Right inguinal hernia    Unspecified viral infection, in conditions classified elsewhere and of unspecified site    viral syndrome - per notes from [facility] dated 01/03/11    Past Surgical History:  Procedure Laterality Date   BOWEL RESECTION N/A 04/02/2014   Procedure: SMALL BOWEL RESECTION;  Surgeon: Dann Hummer, MD;  Location: [facility] OR;  Service: General;  Laterality: N/A;  CARDIAC CATHETERIZATION     CORONARY/GRAFT ACUTE MI REVASCULARIZATION N/A 10/30/2021   Procedure: Coronary/Graft Acute MI Revascularization;  Surgeon: Claudene Victory ORN, MD;  Location: [facility] INVASIVE CV LAB;  Service: Cardiovascular;  Laterality: N/A;   INGUINAL HERNIA REPAIR  02/01/2011   Procedure: LAPAROSCOPIC INGUINAL HERNIA;  Surgeon: Jina Nephew, MD;  Location: [facility] OR;  Service: General;  Laterality: Bilateral;  Laparoscopic inguinal repair with mesh - Bilateral    INGUINAL HERNIA REPAIR Right 04/02/2014   Procedure: HERNIA REPAIR INGUINAL INCARCERATED ;  Surgeon: Dann Hummer, MD;  Location[facility] OR;  Service: General;  Laterality: Right;   LEFT HEART CATH AND CORONARY ANGIOGRAPHY N/A 10/30/2021   Procedure: LEFT HEART CATH AND CORONARY ANGIOGRAPHY;  Surgeon: Claudene Victory ORN, MD;  Location: [facility] INVASIVE CV LAB;  Service: Cardiovascular;  Laterality: N/A;   WISDOM TOOTH EXTRACTION      Current Medications: Current Meds  Medication Sig   apixaban  (ELIQUIS ) 5 MG TABS tablet Take 1 tablet (5 mg total) by mouth 2 (two) times daily.   atorvastatin  (LIPITOR) 40 MG tablet Take 1 tablet (40 mg total) by mouth daily.   Cholecalciferol (VITAMIN D3 PO) Take 1 capsule by mouth every evening.   Evolocumab  (REPATHA  SURECLICK) 140 MG/ML SOAJ Inject 140 mg into the skin every 14 (fourteen) days. For Investigational Use Only. Inject subcutaneously into abdomen, thigh, or upper arm every 14 days. Rotate injection sites and do not inject into areas where skin is tender, bruised, or red. Please contact [facility] for any questions or concerns regarding this medication.   JARDIANCE  10 MG TABS tablet TAKE 1 TABLET BY MOUTH EVERY DAY   levothyroxine (SYNTHROID) 88 MCG tablet Take 88 mcg by mouth daily before breakfast.   Multiple Vitamin (MULITIVITAMIN WITH MINERALS) TABS Take 1 tablet by mouth every evening.   nitroGLYCERIN  (NITROSTAT ) 0.4 MG SL tablet Place 1 tablet (0.4 mg total) under the tongue every 5 (five) minutes as needed for chest pain.   pantoprazole  (PROTONIX ) 40 MG tablet Take 1 tablet (40 mg total) by mouth in the morning. (Patient taking differently: Take 20 mg by mouth every other day.)   sacubitril -valsartan  (ENTRESTO ) 24-26 MG Take 1 tablet by mouth 2 (two) times daily.   spironolactone  (ALDACTONE ) 25 MG tablet TAKE 1 TABLET (25 MG TOTAL) BY MOUTH DAILY.   Study - EVOLVE-MI - evolocumab  (REPATHA ) 140 mg/mL SQ injection (PI-Stuckey) Inject 140 mg  into the skin every 14 (fourteen) days. For Investigational Use Only. Inject subcutaneously into abdomen, thigh, or upper arm every 14 days. Rotate injection sites and do not inject into areas where skin is tender, bruised, or red. Please contact [facility] for any questions or concerns regarding this medication.   tadalafil (CIALIS) 5 MG tablet Take 5 mg by mouth every evening.     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  Tobacco Use   Smoking status: Never   Smokeless tobacco: Never  Vaping Use   Vaping status: Never Used  Substance and Sexual Activity   Alcohol use: Yes    Comment: 1 - 3 wine or beer weekly   Drug use: No   Sexual activity: Not on  file  Other Topics Concern   Not on file  Social History Narrative   Not on file   Social Drivers of Health   Financial Resource Strain: Low Risk  (11/02/2021)   Overall Financial Resource Strain (CARDIA)    Difficulty of Paying Living Expenses: Not very hard  Food Insecurity: Low Risk  (09/14/2023)   Received from [facility]   Hunger Vital Sign    Within the past 12 months, you worried that your food would run out before you got money to buy more: Never true    Within the past 12 months, the food you bought just didn't last and you didn't have money to get more. : Never true  Transportation Needs: No Transportation Needs (09/14/2023)   Received from [facility]    In the past 12 months, has lack of reliable transportation kept you from medical appointments, meetings, work or from getting things needed for daily living? : No  Physical Activity: Not on file  Stress: Not on file  Social Connections: Unknown (05/22/2021)   Received  from [facility]   Social Network    Social Network: Not on file    Social: Occasionally bring 68 year old  Family History: Father, and mother had stroke. History of coronary artery disease notable for grandfather  ROS:   Please see the history of present illness.     EKGs/Labs/Other Studies Reviewed:    The following studies were reviewed today:   Cardiac Studies & Procedures   ______________________________________________________________________________________________ CARDIAC CATHETERIZATION  CARDIAC CATHETERIZATION 10/30/2021  Conclusion CONCLUSIONS: 99% stenosis of the mid LAD after the large second diagonal.  TIMI grade I flow was noted in the vessel.  The vessel was treated with angioplasty and stenting using a 3.0 x 16 Synergy reducing the stenosis to 0% with improvement in TIMI flow to 2.5.  Sluggish flow was related to distal embolization and was treated with IV Aggrastat , IC verapamil , and nitroglycerin  intracoronary. Right dominant anatomy without significant obstruction. Left main is widely patent. Circumflex is small and widely patent. Mid to apical anterior wall severe hypokinesis.  EF 40 to 50%.  LVEDP greater than 22 mmHg.  RECOMMENDATIONS:  Preventive therapy with high intensity statin, beta-blocker, and ACE inhibitor therapy if tolerated. Dual antiplatelet therapy with aspirin  and Brilinta .  Should continue high intensity antiplatelet treatment for at least 6 months and then perhaps Brilinta  monotherapy or converting to clopidogrel  monotherapy could be considered. Patient will need a 2D Doppler echocardiogram.  Findings Coronary Findings Diagnostic  Dominance: Right  Left Anterior Descending Mid LAD to Dist LAD lesion is 99% stenosed.  Intervention  Mid LAD to Dist LAD lesion Stent A drug-eluting stent was successfully placed. Post-Intervention Lesion Assessment The intervention was successful. Pre-interventional TIMI flow is 1.  Post-intervention TIMI flow is 3. No complications occurred at this lesion. There is a 0% residual stenosis post intervention.     ECHOCARDIOGRAM  ECHOCARDIOGRAM COMPLETE 11/14/2022  Narrative ECHOCARDIOGRAM REPORT    Patient Name:   Alex Arias   Date of Exam: 11/14/2022 Medical Rec #:  [id number]     Height:       77.0 in Accession #:    [id number]    Weight:       218.0 lb Date of Birth:  05/18/1955     BSA:          2.320 m Patient Age:    67 years      BP:  112/76 mmHg Patient Gender: M             HR:           54 bpm. Exam Location:  [address]  Procedure: 2D Echo, Cardiac Doppler, Color Doppler and Intracardiac Opacification Agent  Indications:    I50.20 heart failure with reduced EF  History:        Patient has prior history of Echocardiogram examinations, most recent 12/28/2021. CAD and NSTEMI; Risk Factors:Hypertension, HLD and Diabetes.  Sonographer:    Waldo Guadalajara RCS Referring Phys: [id number] Peretz Thieme A Kordelia Severin  IMPRESSIONS   1. There is no left ventricular thrombus (Definity  contrast was used). 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 The apical segments are thin and hyperechoic consistent with scar of previous LAD artery territory infarction. Left ventricular ejection fraction, by estimation, is 35 to 40%. The left ventricle has moderately decreased function. Left ventricular endocardial border not optimally defined to evaluate regional wall motion. Left ventricular diastolic parameters are consistent with Grade I diastolic dysfunction (impaired relaxation). 2. Right ventricular systolic function is normal. The right ventricular size is normal. Tricuspid regurgitation signal is inadequate for assessing PA pressure. 3. The mitral valve is normal in structure. No evidence of mitral valve regurgitation. No evidence of mitral stenosis. 4. The aortic valve is tricuspid. Aortic valve regurgitation is trivial. No aortic stenosis is present.  Comparison(s):  Prior images reviewed side by side. The left ventricular function is unchanged. The left ventricular diastolic function is unchanged. The left ventricular wall motion abnormalities are unchanged.  FINDINGS Left Ventricle: There is no left ventricular thrombus (Definity  contrast was used). 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 The apical segments are thin and hyperechoic consistent with scar of previous LAD artery territory infarction. Left ventricular ejection fraction, by estimation, is 35 to 40%. The left ventricle has moderately decreased function. Left ventricular endocardial border not optimally defined to evaluate regional wall motion. The left ventricular internal cavity size was normal in size. There is no left ventricular hypertrophy. Left ventricular diastolic parameters are consistent with Grade I diastolic dysfunction (impaired relaxation).   LV Wall Scoring: The apex is aneurysmal. The mid inferoseptal segment, apical septal segment, and apical inferior segment are akinetic. The mid and distal anterior wall and mid anteroseptal segment are hypokinetic. The entire lateral wall, inferior wall, basal anteroseptal segment, basal anterior segment, and basal inferoseptal segment are normal.  Right Ventricle: The right ventricular size is normal. No increase in right ventricular wall thickness. Right ventricular systolic function is normal. Tricuspid regurgitation signal is inadequate for assessing PA pressure.  Left [facility]: Left atrial size was normal in size.  Right [facility]: Right atrial size was normal in size.  Pericardium: There is no evidence of pericardial effusion.  Mitral Valve: The mitral valve is normal in structure. There is mild thickening of the mitral valve leaflet(s). There is mild calcification of the mitral valve leaflet(s). No evidence of mitral valve regurgitation. No evidence of mitral valve stenosis.  Tricuspid Valve: The tricuspid valve is normal in structure.  Tricuspid valve regurgitation is not demonstrated.  Aortic Valve: The aortic valve is tricuspid. Aortic valve regurgitation is trivial. Aortic regurgitation PHT measures 345 msec. No aortic stenosis is present.  Pulmonic Valve: The pulmonic valve was normal in structure. Pulmonic valve regurgitation is trivial. No evidence of pulmonic stenosis.  Aorta: The aortic root and ascending aorta are structurally normal, with no evidence of dilitation.  Venous: The inferior vena cava was not well visualized.  IAS/Shunts:  No atrial level shunt detected by color flow Doppler.   LEFT VENTRICLE PLAX 2D LVIDd:         5.20 cm   Diastology LVIDs:         3.40 cm   LV e' medial:    9.03 cm/s LV PW:         1.00 cm   LV E/e' medial:  6.6 LV IVS:        0.80 cm   LV e' lateral:   6.64 cm/s LVOT diam:     2.10 cm   LV E/e' lateral: 9.0 LV SV:         85 LV SV Index:   36 LVOT Area:     3.46 cm   RIGHT VENTRICLE RV Basal diam:  3.60 cm RV S prime:     15.20 cm/s TAPSE (M-mode): 2.8 cm  LEFT ATRIUM             Index        RIGHT ATRIUM           Index LA diam:        3.40 cm 1.47 cm/m   RA Area:     18.70 cm LA Vol (A2C):   68.3 ml 29.44 ml/m  RA Volume:   51.80 ml  22.33 ml/m LA Vol (A4C):   47.1 ml 20.30 ml/m LA Biplane Vol: 57.5 ml 24.78 ml/m AORTIC VALVE LVOT Vmax:   121.00 cm/s LVOT Vmean:  69.000 cm/s LVOT VTI:    0.244 m AI PHT:      345 msec  AORTA Ao Root diam: 3.70 cm Ao Asc diam:  3.60 cm  MITRAL VALVE MV Area (PHT):             SHUNTS MV Decel Time:             Systemic VTI:  0.24 m MV E velocity: 59.70 cm/s  Systemic Diam: 2.10 cm MV A velocity: 64.50 cm/s MV E/A ratio:  0.93  Mihai Croitoru MD Electronically signed by Jerel Balding MD Signature Date/Time: 11/14/2022/6:49:44 PM    Final    MONITORS  LONG TERM MONITOR (3-14 DAYS) 03/29/2022  Narrative   Patient had a minimum heart rate of 38 bpm, maximum heart rate of 240 bpm, and average heart rate of  63 bpm.   Predominant underlying rhythm was sinus rhythm.   Rare 4-5 beat runs of NSVT   Small runs of SVT, longest at 11 seconds.   Isolated PACs were rare (<1.0%).   Isolated PVCs were occasional (1.0%).   No triggered and diary events.  Asymptomatic occasional PVCs and NSVT.       ______________________________________________________________________________________________       Recent Labs: No results found for requested labs within last 365 days.  Recent Lipid Panel    Component Value Date/Time   CHOL 87 (L) 01/27/2022 1622   TRIG 83 01/27/2022 1622   HDL 53 01/27/2022 1622   CHOLHDL 1.6 01/27/2022 1622   CHOLHDL 2.8 10/31/2021 0232   VLDL 21 10/31/2021 0232   LDLCALC 17 01/27/2022 1622    Physical Exam:    VS:  BP 110/70   Pulse (!) 56   Ht 6' 5 (1.956 m)   Wt 216 lb (98 kg)   SpO2 96%   BMI 25.61 kg/m     Wt Readings from Last 3 Encounters:  11/22/23 216 lb (98 kg)  11/22/22 220 lb 3.2 oz (99.9 kg)  06/02/22 218  lb (98.9 kg)    GEN:  Well nourished, well developed in no acute distress HEENT: Normal NECK: No JVD CARDIAC: Regular bradycardia, soft diastolic murmur RESPIRATORY:  Clear to auscultation without rales, wheezing or rhonchi  ABDOMEN: Soft, non-tender, non-distended MUSCULOSKELETAL:  Trace right leg edema; No deformity  SKIN: Warm and dry NEUROLOGIC:  Alert and oriented x 3 PSYCHIATRIC:  Normal affect   ASSESSMENT/ PLAN:     Coronary artery disease status post PCI with angina equivalent Coronary artery disease is well-managed with no current symptoms. Cholesterol levels are well-controlled. Mild exertional symptoms are not indicative of heart failure or hypotension. - Ordered treadmill exercise test to assess exercise tolerance and provide reassurance for increased physical activity. - no change to therapy, reviewed LV apical aneurysm  Heart Failure with Reduced Ejection Fraction (HFrEF- chronic combined) - NYHA I and euvolemic with no  need for PRN lasix  - Heart failure is well-managed on highest tolerated GDMT. No symptoms of volume overload or heart failure exacerbation. Mild exertional symptoms are not indicative of heart failure exacerbation. - Continue current heart failure management regimen.  Paroxysmal Atrial Fibrillation - Hx of asymptomatic SVT Currently in sinus rhythm, on Eliquis  for anticoagulation. -Continue Eliquis ;  CHADVASC NA - no sx or recent occurrence  Trivial AI - echo in 2027 unless new issues  OSA - as per his sleep MD  No barriers to exercise, graded, if testing goes well on year f/u  One year f/u with my or my team       Stanly Leavens, MD FASE [facility] Cardiologist [facility]  [address] [place], [place] [address] [phone]  12:02 PM

## 2023-12-03 ENCOUNTER — Encounter (HOSPITAL_COMMUNITY): Payer: Self-pay | Admitting: Internal Medicine

## 2023-12-04 DIAGNOSIS — I5020 Unspecified systolic (congestive) heart failure: Secondary | ICD-10-CM | POA: Diagnosis not present

## 2023-12-04 DIAGNOSIS — I480 Paroxysmal atrial fibrillation: Secondary | ICD-10-CM | POA: Diagnosis not present

## 2023-12-04 DIAGNOSIS — I25119 Atherosclerotic heart disease of native coronary artery with unspecified angina pectoris: Secondary | ICD-10-CM | POA: Diagnosis not present

## 2023-12-25 ENCOUNTER — Telehealth: Payer: Self-pay | Admitting: *Deleted

## 2023-12-25 NOTE — Telephone Encounter (Signed)
 Called patient for week 108 visit for the EVOLVE study left message for patient to call back.

## 2023-12-27 ENCOUNTER — Encounter: Payer: Self-pay | Admitting: *Deleted

## 2023-12-27 ENCOUNTER — Other Ambulatory Visit: Payer: Self-pay | Admitting: Internal Medicine

## 2023-12-27 DIAGNOSIS — I480 Paroxysmal atrial fibrillation: Secondary | ICD-10-CM

## 2023-12-27 DIAGNOSIS — Z006 Encounter for examination for normal comparison and control in clinical research program: Secondary | ICD-10-CM

## 2023-12-27 NOTE — Telephone Encounter (Signed)
 Prescription refill request for Eliquis  received. Indication: afib  Last office visit: Chandrasekhar 11/22/2023 Scr: 0.95, 09/26/2023 Age: 68 yo  Weight: 98 kg   Refill sent.

## 2023-12-27 NOTE — Research (Signed)
 Week 108 Follow-Up Visit Completed*   []  Not Necessary, No Potential Adverse Events Or Medication Issues Reported On Completed Subject Questionnaire   [x]  Yes, Contact With Subject/Alternate Contact Completed   []  Yes, No Contact With Subject/Alternate Contact Completed, But Electronic Health Record Was Reviewed   []  No, Unable To Contact Subject/Alternate Contact   Have you reviewed Ongoing medications on the Targeted Concomitant Medication form and updated the form as needed?   [x]  Yes   []  No   Subject Status*   [x]  Continuing In Follow-up   []  At Risk For Lost To Follow-up   []  Withdrawal From All Future Study Activities Including Passive Follow-up By Electronic Health Record Review Or Contact With Healthcare Provider Or Family Member/Friend   []  Death   Vital Status*   [x]  Alive   []  Deceased   []  Unknown   Last Known To Be Alive Source*   [x]  Subject Completed Follow-up Questionnaire/Seen In Person/Via Telephone Contact   []  Family Member or Caretaker   []  Primary Physician Or Medical Records   []  Publicly Available Source   []  Other  Date of last dose taken   25-Dec-2023  Over the last 12 weeks did the subject miss any doses? No   Over the last 12 weeks did the subject restart Evolocumab  after an interruption? No

## 2023-12-31 ENCOUNTER — Other Ambulatory Visit: Payer: Self-pay

## 2024-01-01 MED ORDER — SPIRONOLACTONE 25 MG PO TABS: 25.0000 mg | ORAL_TABLET | Freq: Every day | ORAL | 3 refills | Status: AC

## 2024-01-02 ENCOUNTER — Other Ambulatory Visit: Payer: Self-pay

## 2024-01-02 ENCOUNTER — Emergency Department (HOSPITAL_BASED_OUTPATIENT_CLINIC_OR_DEPARTMENT_OTHER)

## 2024-01-02 ENCOUNTER — Emergency Department (HOSPITAL_BASED_OUTPATIENT_CLINIC_OR_DEPARTMENT_OTHER)
Admission: EM | Admit: 2024-01-02 | Discharge: 2024-01-02 | Disposition: A | Discharge status: Home / Self Care | Attending: Emergency Medicine | Admitting: Emergency Medicine

## 2024-01-02 ENCOUNTER — Encounter (HOSPITAL_BASED_OUTPATIENT_CLINIC_OR_DEPARTMENT_OTHER): Payer: Self-pay | Admitting: *Deleted

## 2024-01-02 DIAGNOSIS — I2510 Atherosclerotic heart disease of native coronary artery without angina pectoris: Secondary | ICD-10-CM | POA: Diagnosis not present

## 2024-01-02 DIAGNOSIS — Z7901 Long term (current) use of anticoagulants: Secondary | ICD-10-CM | POA: Diagnosis not present

## 2024-01-02 DIAGNOSIS — Z79899 Other long term (current) drug therapy: Secondary | ICD-10-CM | POA: Insufficient documentation

## 2024-01-02 DIAGNOSIS — I4891 Unspecified atrial fibrillation: Secondary | ICD-10-CM | POA: Diagnosis not present

## 2024-01-02 DIAGNOSIS — S63652A Sprain of metacarpophalangeal joint of right middle finger, initial encounter: Secondary | ICD-10-CM | POA: Diagnosis not present

## 2024-01-02 DIAGNOSIS — W231XXA Caught, crushed, jammed, or pinched between stationary objects, initial encounter: Secondary | ICD-10-CM | POA: Diagnosis not present

## 2024-01-02 DIAGNOSIS — I110 Hypertensive heart disease with heart failure: Secondary | ICD-10-CM | POA: Diagnosis not present

## 2024-01-02 DIAGNOSIS — S6991XA Unspecified injury of right wrist, hand and finger(s), initial encounter: Secondary | ICD-10-CM | POA: Diagnosis not present

## 2024-01-02 DIAGNOSIS — M25541 Pain in joints of right hand: Secondary | ICD-10-CM | POA: Diagnosis not present

## 2024-01-02 DIAGNOSIS — I509 Heart failure, unspecified: Secondary | ICD-10-CM | POA: Insufficient documentation

## 2024-01-02 NOTE — Discharge Instructions (Signed)
 please call the hand doctor to schedule follow-up.  Wear splint to protect the joint.  Take 1000 mg of Tylenol  every 8 hours.  You can take ibuprofen occasionally but make sure you take this medicine with food.  Return to emergency room with new or worsening symptoms.

## 2024-01-02 NOTE — ED Provider Notes (Signed)
 [place] [facility] AT [facility] Provider Note   CSN: [id number] Arrival date & time: 01/02/24  2015     Patient presents with: Hand Injury   Alex Arias is a 68 y.o. male patient with past medical history of hypertension, hyperlipidemia, coronary artery disease, A-fib on Eliquis , congestive heart failure presents to emergency room after mechanical fall.  Patient reports that he lost his balance and fell forward onto outstretched hand but jammed his finger against a table.  He reports he is having right finger pain over third MCP joint.  He has noticed some swelling over the area as well.  Injuries sustained during fall.  No open laceration.    Hand Injury      Prior to Admission medications  Medication Sig Start Date End Date Taking? Authorizing Provider  atorvastatin  (LIPITOR) 40 MG tablet Take 1 tablet (40 mg total) by mouth daily. 11/03/21   Henry Manuelita NOVAK, NP  Cholecalciferol (VITAMIN D3 PO) Take 1 capsule by mouth every evening.    [provider]  ELIQUIS  5 MG TABS tablet TAKE 1 TABLET BY MOUTH TWICE A DAY 12/27/23   Chandrasekhar, Mahesh A, MD  Evolocumab  (REPATHA  SURECLICK) 140 MG/ML SOAJ Inject 140 mg into the skin every 14 (fourteen) days. For Investigational Use Only. Inject subcutaneously into abdomen, thigh, or upper arm every 14 days. Rotate injection sites and do not inject into areas where skin is tender, bruised, or red. Please contact [facility] for any questions or concerns regarding this medication. 03/20/23   Morris Debby BIRCH, MD  JARDIANCE  10 MG TABS tablet TAKE 1 TABLET BY MOUTH EVERY DAY 01/31/23   Santo Stanly LABOR, MD  levothyroxine (SYNTHROID) 88 MCG tablet Take 88 mcg by mouth daily before breakfast. 05/12/19   [provider]  Multiple Vitamin (MULITIVITAMIN WITH MINERALS) TABS Take 1 tablet by mouth every evening.    [provider]  nitroGLYCERIN  (NITROSTAT ) 0.4 MG SL tablet Place 1  tablet (0.4 mg total) under the tongue every 5 (five) minutes as needed for chest pain. 11/02/21   Henry Manuelita NOVAK, NP  pantoprazole  (PROTONIX ) 40 MG tablet Take 1 tablet (40 mg total) by mouth in the morning. Patient taking differently: Take 20 mg by mouth every other day. 05/28/23   Chandrasekhar, Stanly LABOR, MD  sacubitril -valsartan  (ENTRESTO ) 24-26 MG Take 1 tablet by mouth 2 (two) times daily. 11/21/23   Santo Stanly LABOR, MD  spironolactone  (ALDACTONE ) 25 MG tablet Take 1 tablet (25 mg total) by mouth daily. 01/01/24   Chandrasekhar, Stanly LABOR, MD  Study - EVOLVE-MI - evolocumab  (REPATHA ) 140 mg/mL SQ injection (PI-Stuckey) Inject 140 mg into the skin every 14 (fourteen) days. For Investigational Use Only. Inject subcutaneously into abdomen, thigh, or upper arm every 14 days. Rotate injection sites and do not inject into areas where skin is tender, bruised, or red. Please contact [facility] for any questions or concerns regarding this medication. 11/01/21   Morris Debby BIRCH, MD  tadalafil (CIALIS) 5 MG tablet Take 5 mg by mouth every evening.    [provider]    Allergies: Ace inhibitors    Review of Systems  Musculoskeletal:  Positive for arthralgias.    Updated Vital Signs BP (!) 126/108 (BP Location: Left Arm)   Pulse 60   Temp 97.9 F (36.6 C)   Resp 17   SpO2 100%   Physical Exam Vitals and nursing note reviewed.  Constitutional:      General: He  is not in acute distress.    Appearance: He is not toxic-appearing.  HENT:     Head: Normocephalic and atraumatic.  Eyes:     General: No scleral icterus.    Conjunctiva/sclera: Conjunctivae normal.  Cardiovascular:     Rate and Rhythm: Normal rate and regular rhythm.     Pulses: Normal pulses.     Heart sounds: Normal heart sounds.  Pulmonary:     Effort: Pulmonary effort is normal. No respiratory distress.     Breath sounds: Normal breath sounds.  Abdominal:     General: Abdomen is flat.  Bowel sounds are normal.     Palpations: Abdomen is soft.     Tenderness: There is no abdominal tenderness.  Musculoskeletal:     Right lower leg: No edema.     Left lower leg: No edema.  Skin:    General: Skin is warm and dry.     Findings: No lesion.  Neurological:     General: No focal deficit present.     Mental Status: He is alert and oriented to person, place, and time. Mental status is at baseline.     (all labs ordered are listed, but only abnormal results are displayed) Labs Reviewed - No data to display  EKG: None  Radiology: DG Hand Complete Right Result Date: 01/02/2024 EXAM: 3 OR MORE VIEW(S) XRAY OF THE HAND 01/02/2024 08:56:00 PM COMPARISON: None available. 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 Moderate osteoarthritis of the first, second and third metacarpophalangeal joints. Moderate osteoarthritis of the radiocarpal joint and scaphoid. SOFT TISSUES: Periarticular soft tissue swelling involving the third metacarpophalangeal joints. IMPRESSION: 1. Periarticular soft tissue swelling involving the third MCP joint. 2. Remote nonunited scaphoid waist fracture without significant malalignment. 3. Degenerative cyst within the lunate and moderate osteoarthritis of the first, second, and third metacarpophalangeal joints, radiocarpal joint, and scaphoid. Electronically signed by: Dorethia Molt MD 01/02/2024 09:00 PM EST RP Workstation: [id number]     Procedures   Medications Ordered in the ED - No data to display                                  Medical Decision Making Amount and/or Complexity of Data Reviewed Radiology: ordered.   This patient presents to the ED for concern of hand pain, this involves an extensive number of treatment options, and is a complaint that carries with it a high risk of complications and  morbidity.  The differential diagnosis includes septic joint, fracture, sprain   Imaging Studies ordered:  I ordered imaging studies including right hand x-ray  I independently visualized and interpreted imaging which showed 1. Periarticular soft tissue swelling involving the third MCP joint. 2. Remote nonunited scaphoid waist fracture without significant malalignment. 3. Degenerative cyst within the lunate and moderate osteoarthritis of the first, second, and third metacarpophalangeal joints, radiocarpal joint, and scaphoid. I agree with the radiologist interpretation   Cardiac Monitoring: / EKG:  The patient was maintained on a cardiac monitor.     Problem List / ED Course / Critical interventions / Medication management  Patient presents with mechanical fall.  He fell onto outstretched hand injuring his right finger.  He localizes pain to right MCP.  He has swelling over the joint and significant pain when trying to move the finger.  He has no  other injuries during fall.  X-ray of his right hand shows no acute fracture, has periarticular soft tissue swelling over the third MCP, remote scaphoid fracture and degenerative changes.  On exam patient is not having any wrist pain and specifically has no tenderness over snuffbox.  He is neurovascularly intact.  He was provided with splint for 3rd MCP.  Provided patient with finger brace.  He will follow-up with hand.  Given return precautions.       Final diagnoses:  Sprain of metacarpophalangeal (MCP) joint of right middle finger, initial encounter    ED Discharge Orders     None          Shermon Warren SAILOR, PA-C 01/02/24 2143    Charlyn Sora, MD 01/03/24 1545

## 2024-01-02 NOTE — ED Triage Notes (Addendum)
 Pt has pain in right hand after losing his grip and slipping while on knees and jammed hand. Swelling noted to right hand. Pt is on eliquis 

## 2024-01-04 ENCOUNTER — Telehealth: Payer: Self-pay | Admitting: Internal Medicine

## 2024-01-04 NOTE — Telephone Encounter (Signed)
 Pt needing to sched Echo Stress Test. No order for one, please advide.

## 2024-01-04 NOTE — Telephone Encounter (Signed)
 Left message to call back. Per Dr Primus at [facility]  - echo in 2027 unless new issues

## 2024-01-23 ENCOUNTER — Telehealth: Payer: Self-pay | Admitting: Internal Medicine

## 2024-01-23 NOTE — Telephone Encounter (Signed)
 Wife is calling in about a stress test patient is suppose to have done, but there is no order for it. Please advise

## 2024-01-23 NOTE — Telephone Encounter (Signed)
 Attempted to call wife and patient regarding stress test she said wasn't ordered. Exercise Tolerant Test has been ordered since 11/22/2023. Will wait to hear back to confirm if there's a different test they are requesting.

## 2024-01-24 NOTE — Telephone Encounter (Signed)
 Called pt left a message advising our office reached out to scheduled stress test 3 times with no call back.  Asked that pt call in or send a my chart message to schedule testing.

## 2024-01-25 NOTE — Telephone Encounter (Signed)
 Pt scheduled for testing 01/31/24.

## 2024-01-29 ENCOUNTER — Telehealth (HOSPITAL_COMMUNITY): Payer: Self-pay | Admitting: *Deleted

## 2024-01-29 NOTE — Telephone Encounter (Signed)
Left detailed instructions for ETT.

## 2024-01-30 ENCOUNTER — Other Ambulatory Visit: Payer: Self-pay | Admitting: Internal Medicine

## 2024-01-31 ENCOUNTER — Ambulatory Visit (HOSPITAL_COMMUNITY)
Admission: RE | Admit: 2024-01-31 | Discharge: 2024-01-31 | Disposition: A | Discharge status: Home / Self Care | Source: Ambulatory Visit | Attending: Internal Medicine | Admitting: Internal Medicine

## 2024-01-31 DIAGNOSIS — I25110 Atherosclerotic heart disease of native coronary artery with unstable angina pectoris: Secondary | ICD-10-CM | POA: Insufficient documentation

## 2024-01-31 DIAGNOSIS — I480 Paroxysmal atrial fibrillation: Secondary | ICD-10-CM | POA: Insufficient documentation

## 2024-01-31 DIAGNOSIS — I10 Essential (primary) hypertension: Secondary | ICD-10-CM | POA: Diagnosis present

## 2024-01-31 DIAGNOSIS — I5020 Unspecified systolic (congestive) heart failure: Secondary | ICD-10-CM | POA: Insufficient documentation

## 2024-01-31 LAB — EXERCISE TOLERANCE TEST
Angina Index: 0
Duke Treadmill Score: 9
Estimated workload: 10.1
Exercise duration (min): 9 min
Exercise duration (sec): 0 s
MPHR: 150 {beats}/min
Peak HR: 137 {beats}/min
Percent HR: 91 %
Rest HR: 59 {beats}/min
ST Depression (mm): 0 mm

## 2024-01-31 MED ORDER — EMPAGLIFLOZIN 10 MG PO TABS: 10.0000 mg | ORAL_TABLET | Freq: Every day | ORAL | 3 refills | Status: AC

## 2024-01-31 NOTE — Telephone Encounter (Signed)
 In accordance with refill protocols, please review and address the following requirements before this medication refill can be authorized:  Labs

## 2024-02-04 ENCOUNTER — Ambulatory Visit: Payer: Self-pay

## 2024-02-04 ENCOUNTER — Other Ambulatory Visit: Payer: Self-pay

## 2024-02-04 ENCOUNTER — Other Ambulatory Visit (HOSPITAL_COMMUNITY): Payer: Self-pay | Admitting: Cardiology

## 2024-02-04 ENCOUNTER — Other Ambulatory Visit (HOSPITAL_COMMUNITY): Payer: Self-pay

## 2024-02-04 DIAGNOSIS — I493 Ventricular premature depolarization: Secondary | ICD-10-CM

## 2024-02-06 ENCOUNTER — Other Ambulatory Visit: Payer: Self-pay

## 2024-02-07 ENCOUNTER — Other Ambulatory Visit (HOSPITAL_COMMUNITY): Payer: Self-pay

## 2024-02-08 ENCOUNTER — Ambulatory Visit: Attending: Internal Medicine

## 2024-02-08 ENCOUNTER — Other Ambulatory Visit: Payer: Self-pay

## 2024-02-08 DIAGNOSIS — I493 Ventricular premature depolarization: Secondary | ICD-10-CM

## 2024-02-08 NOTE — Addendum Note (Signed)
 Addended by: RANDY HAMP SAILOR on: 02/08/2024 11:30 AM   Modules accepted: Orders

## 2024-02-08 NOTE — Progress Notes (Unsigned)
 Enrolled patient for a 7 day Zio XT monitor to be mailed to patients home.

## 2024-02-11 ENCOUNTER — Other Ambulatory Visit: Payer: Self-pay

## 2024-02-11 ENCOUNTER — Other Ambulatory Visit: Payer: Self-pay | Admitting: Cardiology

## 2024-02-11 NOTE — Telephone Encounter (Signed)
 Pt of Dr. Santo. This RX was not prescribed by Dr Santo. Does Dr. Santo want to refill? Please advise.

## 2024-02-12 ENCOUNTER — Other Ambulatory Visit (HOSPITAL_COMMUNITY): Payer: Self-pay

## 2024-02-12 ENCOUNTER — Other Ambulatory Visit: Payer: Self-pay

## 2024-02-13 ENCOUNTER — Other Ambulatory Visit: Payer: Self-pay

## 2024-02-13 ENCOUNTER — Other Ambulatory Visit (HOSPITAL_COMMUNITY): Payer: Self-pay | Admitting: Cardiology

## 2024-02-13 ENCOUNTER — Other Ambulatory Visit (HOSPITAL_COMMUNITY): Payer: Self-pay

## 2024-02-13 MED ORDER — REPATHA SURECLICK 140 MG/ML ~~LOC~~ SOAJ
140.0000 mg | SUBCUTANEOUS | 2 refills | Status: AC
  Filled 2024-02-13: qty 12, 168d supply, fill #0

## 2024-02-13 MED ORDER — REPATHA SURECLICK 140 MG/ML ~~LOC~~ SOAJ: 140.0000 mg | SUBCUTANEOUS | 2 refills | Status: DC

## 2024-02-13 NOTE — Progress Notes (Signed)
 Specialty Pharmacy Initial Fill Coordination Note  Alex Arias is a 69 y.o. male contacted today regarding initial fill of specialty medication(s) Evolocumab    Patient requested [facility] at [facility] at [place] date: 02/15/24   Medication will be filled on: 02/14/24    Patient is aware of $0 copayment.

## 2024-02-14 ENCOUNTER — Other Ambulatory Visit: Payer: Self-pay

## 2024-02-17 ENCOUNTER — Other Ambulatory Visit: Payer: Self-pay | Admitting: Internal Medicine
